# Patient Record
Sex: Female | Born: 1950
Health system: Southern US, Community
[De-identification: ages and names within clinical notes are randomized; demographics above are authoritative.]

## PROBLEM LIST (undated history)

## (undated) DIAGNOSIS — M1990 Unspecified osteoarthritis, unspecified site: Secondary | ICD-10-CM

## (undated) DIAGNOSIS — D649 Anemia, unspecified: Secondary | ICD-10-CM

## (undated) DIAGNOSIS — M549 Dorsalgia, unspecified: Secondary | ICD-10-CM

## (undated) DIAGNOSIS — J45909 Unspecified asthma, uncomplicated: Secondary | ICD-10-CM

## (undated) DIAGNOSIS — T7840XA Allergy, unspecified, initial encounter: Secondary | ICD-10-CM

## (undated) DIAGNOSIS — E039 Hypothyroidism, unspecified: Secondary | ICD-10-CM

## (undated) DIAGNOSIS — I10 Essential (primary) hypertension: Secondary | ICD-10-CM

## (undated) DIAGNOSIS — E119 Type 2 diabetes mellitus without complications: Secondary | ICD-10-CM

## (undated) HISTORY — PX: BACK SURGERY: SHX140

## (undated) HISTORY — DX: Unspecified osteoarthritis, unspecified site: M19.90

## (undated) HISTORY — DX: Allergy, unspecified, initial encounter: T78.40XA

## (undated) HISTORY — PX: CARPAL TUNNEL RELEASE: SHX101

## (undated) HISTORY — DX: Anemia, unspecified: D64.9

## (undated) HISTORY — DX: Unspecified asthma, uncomplicated: J45.909

## (undated) HISTORY — PX: TUBAL LIGATION: SHX77

---

## 1987-04-07 HISTORY — PX: ABDOMINAL HYSTERECTOMY: SHX81

## 2000-07-20 ENCOUNTER — Other Ambulatory Visit: Admission: RE | Admit: 2000-07-20 | Discharge: 2000-07-20 | Payer: Self-pay | Admitting: Obstetrics and Gynecology

## 2004-09-25 ENCOUNTER — Inpatient Hospital Stay (HOSPITAL_COMMUNITY): Admission: EM | Admit: 2004-09-25 | Discharge: 2004-09-26 | Payer: Self-pay | Admitting: Emergency Medicine

## 2006-07-30 ENCOUNTER — Encounter: Payer: Self-pay | Admitting: Emergency Medicine

## 2006-07-30 ENCOUNTER — Inpatient Hospital Stay (HOSPITAL_COMMUNITY): Admission: EM | Admit: 2006-07-30 | Discharge: 2006-08-05 | Payer: Self-pay | Admitting: Emergency Medicine

## 2006-08-02 ENCOUNTER — Ambulatory Visit: Payer: Self-pay | Admitting: Physical Medicine & Rehabilitation

## 2006-11-30 ENCOUNTER — Other Ambulatory Visit: Admission: RE | Admit: 2006-11-30 | Discharge: 2006-11-30 | Payer: Self-pay | Admitting: General Surgery

## 2006-12-25 ENCOUNTER — Encounter: Admission: RE | Admit: 2006-12-25 | Discharge: 2006-12-25 | Payer: Self-pay | Admitting: Orthopaedic Surgery

## 2008-08-11 ENCOUNTER — Encounter: Admission: RE | Admit: 2008-08-11 | Discharge: 2008-08-11 | Payer: Self-pay | Admitting: Orthopaedic Surgery

## 2008-10-26 ENCOUNTER — Encounter: Admission: RE | Admit: 2008-10-26 | Discharge: 2008-10-26 | Payer: Self-pay | Admitting: Obstetrics and Gynecology

## 2009-08-10 ENCOUNTER — Encounter: Admission: RE | Admit: 2009-08-10 | Discharge: 2009-08-10 | Payer: Self-pay | Admitting: Orthopaedic Surgery
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
## 2011-09-29 ENCOUNTER — Ambulatory Visit (INDEPENDENT_AMBULATORY_CARE_PROVIDER_SITE_OTHER): Payer: Medicare Other | Admitting: Family Medicine

## 2011-09-29 ENCOUNTER — Encounter: Payer: Self-pay | Admitting: Family Medicine

## 2011-09-29 VITALS — BP 140/96 | HR 72 | Temp 98.3°F | Ht 68.6 in | Wt 208.0 lb

## 2011-09-29 DIAGNOSIS — G8929 Other chronic pain: Secondary | ICD-10-CM

## 2011-09-29 DIAGNOSIS — R030 Elevated blood-pressure reading, without diagnosis of hypertension: Secondary | ICD-10-CM | POA: Insufficient documentation

## 2011-09-29 DIAGNOSIS — R600 Localized edema: Secondary | ICD-10-CM

## 2011-09-29 DIAGNOSIS — R609 Edema, unspecified: Secondary | ICD-10-CM

## 2011-09-29 DIAGNOSIS — IMO0001 Reserved for inherently not codable concepts without codable children: Secondary | ICD-10-CM

## 2011-09-29 DIAGNOSIS — M549 Dorsalgia, unspecified: Secondary | ICD-10-CM

## 2011-09-29 DIAGNOSIS — E669 Obesity, unspecified: Secondary | ICD-10-CM

## 2011-09-29 LAB — COMPREHENSIVE METABOLIC PANEL
ALT: <8 U/L (ref 0–35)
AST: 18 U/L (ref 0–37)
Albumin: 4.4 g/dL (ref 3.5–5.2)
Alkaline Phosphatase: 48 U/L (ref 39–117)
BUN: 13 mg/dL (ref 6–23)
CO2: 27 mEq/L (ref 19–32)
Calcium: 9.2 mg/dL (ref 8.4–10.5)
Chloride: 108 mEq/L (ref 96–112)
Creat: 0.78 mg/dL (ref 0.50–1.10)
Glucose, Bld: 101 mg/dL — ABNORMAL HIGH (ref 70–99)
Potassium: 4.4 mEq/L (ref 3.5–5.3)
Sodium: 141 mEq/L (ref 135–145)
Total Bilirubin: 0.3 mg/dL (ref 0.3–1.2)
Total Protein: 7.1 g/dL (ref 6.0–8.3)

## 2011-09-29 LAB — LIPID PANEL
Cholesterol: 180 mg/dL (ref 0–200)
HDL: 37 mg/dL — ABNORMAL LOW (ref >39)
LDL Cholesterol: 102 mg/dL — ABNORMAL HIGH (ref 0–99)
Total CHOL/HDL Ratio: 4.9 Ratio
Triglycerides: 204 mg/dL — ABNORMAL HIGH (ref <150)
VLDL: 41 mg/dL — ABNORMAL HIGH (ref 0–40)

## 2011-09-29 LAB — CBC
HCT: 34.9 % — ABNORMAL LOW (ref 36.0–46.0)
Hemoglobin: 11.4 g/dL — ABNORMAL LOW (ref 12.0–15.0)
MCH: 27.3 pg (ref 26.0–34.0)
MCHC: 32.7 g/dL (ref 30.0–36.0)
MCV: 83.5 fL (ref 78.0–100.0)
Platelets: 353 10*3/uL (ref 150–400)
RBC: 4.18 MIL/uL (ref 3.87–5.11)
RDW: 14.9 % (ref 11.5–15.5)
WBC: 7 10*3/uL (ref 4.0–10.5)

## 2011-09-29 LAB — HEPATITIS C ANTIBODY: HCV Ab: NEGATIVE

## 2011-09-29 MED ORDER — MEDICAL COMPRESSION STOCKINGS MISC: 2.0000 [IU] | Freq: Every morning | Status: DC

## 2011-09-29 NOTE — Patient Instructions (Addendum)
Nice to meet you. Will check labs to make sure no reason for leg swelling. Most likely just venous insufficiency, can be improved by elevation and compression stockings. Make an appointment in 2 weeks to check blood pressure and discuss results. Have your mammogram yearly.  Venous Stasis and Chronic Venous Insufficiency As people age, the veins located in their legs may weaken and stretch. When veins weaken and lose the ability to pump blood effectively, the condition is called chronic venous insufficiency (CVI) or venous stasis. Almost all veins return blood back to the heart. This happens by:  The force of the heart pumping fresh blood pushes blood back to the heart.   Blood flowing to the heart from the force of gravity.  In the deep veins of the legs, blood has to fight gravity and flow upstream back to the heart. Here, the leg muscles contract to pump blood back toward the heart. Vein walls are elastic, and many veins have small valves that only allow blood to flow in one direction. When leg muscles contract, they push inward against the elastic vein walls. This squeezes blood upward, opens the valves, and moves blood toward the heart. When leg muscles relax, the vein Sigley also relaxes and the valves inside the vein close to prevent blood from flowing backward. This method of pumping blood out of the legs is called the venous pump. CAUSES  The venous pump works best while walking and leg muscles are contracting. But when a person sits or stands, blood pressure in leg veins can build. Deep veins are usually able to withstand short periods of inactivity, but long periods of inactivity (and increased pressure) can stretch, weaken, and damage vein walls. High blood pressure can also stretch and damage vein walls. The veins may no longer be able to pump blood back to the heart. Venous hypertension (high blood pressure inside veins) that lasts over time is a primary cause of CVI. CVI can also be caused  by:   Deep vein thrombosis, a condition where a thrombus (blood clot) blocks blood flow in a vein.   Phlebitis, an inflammation of a superficial vein that causes a blood clot to form.  Other risk factors for CVI may include:   Heredity.   Obesity.   Pregnancy.   Sedentary lifestyle.   Smoking.   Jobs requiring long periods of standing or sitting in one place.   Age and gender:   Women in their 39's and 29's and men in their 95's are more prone to developing CVI.  SYMPTOMS  Symptoms of CVI may include:   Varicose veins.   Ulceration or skin breakdown.   Lipodermatosclerosis, a condition that affects the skin just above the ankle, usually on the inside surface. Over time the skin becomes brown, smooth, tight and often painful. Those with this condition have a high risk of developing skin ulcers.   Reddened or discolored skin on the leg.   Swelling.  DIAGNOSIS  Your caregiver can diagnose CVI after performing a careful medical history and physical examination. To confirm the diagnosis, the following tests may also be ordered:   Duplex ultrasound.   Plethysmography (tests blood flow).   Venograms (x-ray using a special dye).  TREATMENT The goals of treatment for CVI are to restore a person to an active life and to minimize pain or disability. Typically, CVI does not pose a serious threat to life or limb, and with proper treatment most people with this condition can continue to lead  active lives. In most cases, mild CVI can be treated on an outpatient basis with simple procedures. Treatment methods include:   Elastic compression socks.   Sclerotherapy, a procedure involving an injection of a material that "dissolves" the damaged veins. Other veins in the network of blood vessels take over the function of the damaged veins.   Vein stripping (an older procedure less commonly used).   Laser Ablation surgery.   Valve repair.  HOME CARE INSTRUCTIONS   Elastic  compression socks must be worn every day. They can help with symptoms and lower the chances of the problem getting worse, but they do not cure the problem.   Only take over-the-counter or prescription medicines for pain, discomfort, or fever as directed by your caregiver.   Your caregiver will review your other medications with you.  SEEK MEDICAL CARE IF:   You are confused about how to take your medications.   There is redness, swelling, or increasing pain in the affected area.   There is a red streak or line that extends up or down from the affected area.   There is a breakdown or loss of skin in the affected area, even if the breakdown is small.   You develop an unexplained oral temperature above 102 F (38.9 C).   There is an injury to the affected area.  SEEK IMMEDIATE MEDICAL CARE IF:   There is an injury and open wound to the affected area.   Pain is not adequately relieved with pain medication prescribed or becomes severe.   An oral temperature above 102 F (38.9 C) develops.   The foot/ankle below the affected area becomes suddenly numb or the area feels weak and hard to move.  MAKE SURE YOU:   Understand these instructions.   Will watch your condition.   Will get help right away if you are not doing well or get worse.  Document Released: 07/27/2006 Document Revised: 03/12/2011 Document Reviewed: 10/04/2006 [facility] Patient Information 2012 [place], [place].

## 2011-09-30 NOTE — Assessment & Plan Note (Signed)
Stable. Patient is not a surgical candidate related to her DDD. On chronic xanax, flexeril, vicodin prescribed by her previous PCP. Advised to schedule follow up visit when she needs refills of controlled medications.

## 2011-09-30 NOTE — Assessment & Plan Note (Signed)
No signs or symptoms suggestive of cardiac or hepatic failure. Most likely this is venous insufficieny with symmetric appearance, nontender. Will check basic labs, screen HCV. Advised to wear compression stockings and elevation of legs as tolerated. Follow up in 1-2 months.

## 2011-09-30 NOTE — Progress Notes (Signed)
  Subjective:    Patient ID: [name] [name], female    DOB: 12-Jul-1950, 61 y.o.   MRN: [id number]  HPI New patient to establish care.  1. Leg edema. Patient having stable bilateral swelling in ankles for past 6-8 months. Pain is minimal.She has tried elevation which helps sometimes.  Denies any trauma, leg pain, immobility, increase exertional dyspnea, abdominal swelling.   2. Chronic back pain.  Back surgeries in 1985 and 1987, severe DDD. No longer a surgical candidate. Followed by orthopedists Dr. Ophelia Charter and Dr. Ezzard Standing. On chronic xanax, flexeril and vicodin. Denies recent injury, weakness, numbness.  3. Obesity. Overall has gained weight in past several years, but is starting attempt to lose and has lost about 10 lbs so far.   Past Medical History  Diagnosis Date  . Allergy   . Arthritis   . Asthma   . Anemia    Past Surgical History  Procedure Date  . Tubal ligation   . Back surgery 1987, 1985    DDD  . Abdominal hysterectomy 1989    fibroid-no longer needs pap   Family History  Problem Relation Age of Onset  . Asthma Mother   . Cancer Mother     AML  . Heart disease Father   . Hypertension Father   . Kidney disease Father    History   Social History  . Marital Status: Divorced    Spouse Name: N/A    Number of Children: N/A  . Years of Education: N/A   Occupational History  . Not on file.   Social History Main Topics  . Smoking status: Never Smoker   . Smokeless tobacco: Not on file  . Alcohol Use: No  . Drug Use: No  . Sexually Active: Not Currently   Other Topics Concern  . Not on file   Social History Narrative   High school graduate, disabled from back pain. Divorced, has an adult son in [place].    Review of Systems See HPI otherwise negative.    Objective:   Physical Exam  Vitals reviewed. Constitutional: She is oriented to person, place, and time. She appears well-developed and well-nourished. No distress.  HENT:  Head: Normocephalic  and atraumatic.  Eyes: EOM are normal. Pupils are equal, round, and reactive to light.  Neck: Neck supple.  Cardiovascular: Normal rate, regular rhythm and normal heart sounds.   No murmur heard. Pulmonary/Chest: Effort normal and breath sounds normal. No respiratory distress. She has no wheezes. She has no rales.  Abdominal: Soft. Bowel sounds are normal. There is no tenderness.  Musculoskeletal: She exhibits edema. She exhibits no tenderness.       Bilateral ankles with generalized edema, nonpitting. Normal ROM, no redness or warmth, no calf tenderness. Normal gait.   Neurological: She is alert and oriented to person, place, and time. No cranial nerve deficit.  Skin: No rash noted. She is not diaphoretic.  Psychiatric: She has a normal mood and affect.       Assessment & Plan:

## 2011-09-30 NOTE — Assessment & Plan Note (Signed)
New problem for patient. Will check labs and have her return for recheck, may need to initiate diuretic if still elevated.

## 2011-10-01 ENCOUNTER — Encounter: Payer: Self-pay | Admitting: Family Medicine

## 2011-10-13 ENCOUNTER — Ambulatory Visit (INDEPENDENT_AMBULATORY_CARE_PROVIDER_SITE_OTHER): Payer: Medicare Other | Admitting: Family Medicine

## 2011-10-13 ENCOUNTER — Encounter: Payer: Self-pay | Admitting: Family Medicine

## 2011-10-13 VITALS — BP 138/88 | HR 72 | Temp 98.5°F | Ht 68.5 in | Wt 209.9 lb

## 2011-10-13 DIAGNOSIS — Z0000 Encounter for general adult medical examination without abnormal findings: Secondary | ICD-10-CM

## 2011-10-13 DIAGNOSIS — R030 Elevated blood-pressure reading, without diagnosis of hypertension: Secondary | ICD-10-CM

## 2011-10-13 DIAGNOSIS — R609 Edema, unspecified: Secondary | ICD-10-CM

## 2011-10-13 DIAGNOSIS — IMO0001 Reserved for inherently not codable concepts without codable children: Secondary | ICD-10-CM

## 2011-10-13 DIAGNOSIS — R600 Localized edema: Secondary | ICD-10-CM

## 2011-10-13 DIAGNOSIS — G8929 Other chronic pain: Secondary | ICD-10-CM

## 2011-10-13 DIAGNOSIS — M549 Dorsalgia, unspecified: Secondary | ICD-10-CM

## 2011-10-13 MED ORDER — CYCLOBENZAPRINE HCL 10 MG PO TABS: 10.0000 mg | ORAL_TABLET | Freq: Every day | ORAL | Status: DC | PRN

## 2011-10-13 MED ORDER — HYDROCODONE-ACETAMINOPHEN 7.5-500 MG PO TABS: 1.0000 | ORAL_TABLET | Freq: Every day | ORAL | Status: DC | PRN

## 2011-10-13 MED ORDER — HYDROCHLOROTHIAZIDE 25 MG PO TABS: 12.5000 mg | ORAL_TABLET | Freq: Every day | ORAL | Status: DC

## 2011-10-13 NOTE — Patient Instructions (Addendum)
Your lab didn't show a reason for ankle swelling. Most likely due to aging veins. You may try hydrochlorothiazide half tablet daily, will also help blood pressure. i don't mind refilling your pain medication if it helps you get around better. Please make an appointment in 2 months for follow up. Make sure to get your mammogram and we should check on when your colonoscopy is due.

## 2011-10-14 ENCOUNTER — Other Ambulatory Visit: Payer: Self-pay | Admitting: Obstetrics and Gynecology

## 2011-10-14 DIAGNOSIS — Z1231 Encounter for screening mammogram for malignant neoplasm of breast: Secondary | ICD-10-CM

## 2011-10-15 DIAGNOSIS — Z0000 Encounter for general adult medical examination without abnormal findings: Secondary | ICD-10-CM | POA: Insufficient documentation

## 2011-10-15 NOTE — Assessment & Plan Note (Signed)
Reminded patient she is due for mammogram and probably colonoscopy. Will attempt to get records.

## 2011-10-15 NOTE — Progress Notes (Signed)
  Subjective:    Patient ID: [name] [name], female    DOB: [birth year], 61 y.o.   MRN: [id number]  HPI  1. HTN f/u. Has stopped taking as much motrin.Has not checked BP at home.  Denies any visual change, chest pain, dyspnea, increase in leg swelling.   2. Ankle swelling. Unchanged. Has same amount of ankle swelling that varies slightly with elevation. No pain, redness, swelling in legs, numbness, tingling, exertional dyspnea, cough, fevers.   3. Back pain. Patient has been taking vicodin and xanax and flexeril intermittently to deal with her chronic back pain, increase her functionality at home. ON average takes less than one tablet daily. She is out of medication and requests new rx. Previously had 2 disc surgeries and told she is not a surgical candidate anymore. No longer follows with orthopedics regularly. denies worsening of pain, weakness, numbness, tingling, incontinence.   Review of Systems See HPI otherwise negative.  reports that she has never smoked. She does not have any smokeless tobacco history on file.     Objective:   Physical Exam  Vitals reviewed. Constitutional: She is oriented to person, place, and time. She appears well-developed and well-nourished. No distress.  HENT:  Head: Normocephalic and atraumatic.  Eyes: EOM are normal.  Neck: Normal range of motion. Neck supple.  Cardiovascular: Normal rate, regular rhythm and normal heart sounds.   No murmur heard. Pulmonary/Chest: Effort normal and breath sounds normal. No respiratory distress. She has no wheezes. She has no rales.  Abdominal: Soft. She exhibits no distension. There is no tenderness. There is no rebound and no guarding.  Musculoskeletal: She exhibits no tenderness.       Stable bilateral symmetric ankle edema. Nontender, nonpitting. Good pulses.   Neurological: She is alert and oriented to person, place, and time.  Skin: No rash noted.  Psychiatric: She has a normal mood and affect.      Assessment &  Plan:

## 2011-10-15 NOTE — Assessment & Plan Note (Signed)
Chronic DDD and resultant pain, unchanged. Since intermittent vicodin and flexeril are helpful to her daily living/function and activity level, i have agreed to continue these medications previously filled by her PCP prior to transfer. She has agreed to controlled substance agreement, and seems low risk for abuse potential. She is agreeable to DC the xanax as I believe may cause more of a problem in the long term. F/u in 2 months.

## 2011-10-15 NOTE — Assessment & Plan Note (Signed)
BP is at goal today, but borderline. Labs normal. Discussed with patient this wouldn't warrant treatment, but since she is concerned about cosmetic effect of ankle swelling we agree to try low dose HCTZ. F/u in 1- 2 months or sooner if problems.

## 2011-10-15 NOTE — Assessment & Plan Note (Signed)
No physical or laboratory indication of pathology. Likely this is chronic venous insufficiency. Low dose HCTZ as patient strongly desires trial for cosmetic effect (no discomfort associated). F/u in 1-2 months.

## 2011-10-29 ENCOUNTER — Ambulatory Visit: Payer: Medicare Other

## 2012-02-03 ENCOUNTER — Encounter: Payer: Self-pay | Admitting: Family Medicine

## 2012-02-03 ENCOUNTER — Ambulatory Visit (INDEPENDENT_AMBULATORY_CARE_PROVIDER_SITE_OTHER): Payer: Medicare Other | Admitting: Family Medicine

## 2012-02-03 VITALS — BP 137/76 | HR 72 | Temp 98.9°F | Ht 68.5 in | Wt 210.3 lb

## 2012-02-03 DIAGNOSIS — M549 Dorsalgia, unspecified: Secondary | ICD-10-CM

## 2012-02-03 DIAGNOSIS — G8929 Other chronic pain: Secondary | ICD-10-CM

## 2012-02-03 DIAGNOSIS — Z0000 Encounter for general adult medical examination without abnormal findings: Secondary | ICD-10-CM

## 2012-02-03 DIAGNOSIS — R300 Dysuria: Secondary | ICD-10-CM

## 2012-02-03 LAB — POCT UA - MICROSCOPIC ONLY

## 2012-02-03 LAB — POCT URINALYSIS DIPSTICK
Bilirubin, UA: NEGATIVE
Blood, UA: NEGATIVE
Glucose, UA: NEGATIVE
Ketones, UA: NEGATIVE
Nitrite, UA: NEGATIVE
Protein, UA: NEGATIVE
Spec Grav, UA: 1.025
Urobilinogen, UA: 0.2
pH, UA: 6

## 2012-02-03 MED ORDER — CYCLOBENZAPRINE HCL 10 MG PO TABS: 10.0000 mg | ORAL_TABLET | Freq: Every day | ORAL | Status: DC | PRN

## 2012-02-03 MED ORDER — HYDROCODONE-ACETAMINOPHEN 7.5-500 MG PO TABS: 1.0000 | ORAL_TABLET | Freq: Every day | ORAL | Status: DC | PRN

## 2012-02-03 NOTE — Progress Notes (Signed)
  Subjective:    Patient ID: [name] [name], female    DOB: 1951/04/02, 61 y.o.   MRN: [id number]  HPI  Patient presents to clinic to be screened for CKD.  Patient's father currently diagnosed with ESRD and on HD.  Her uncle and brother also have CKD.  Patient says she was told by her father's physician that will need to be tested routinely.  Patient endorses lower extremity/ankle edema bilaterally.  Otherwise, she has no other complaints.  Denies any change in urinary frequency or dysuria.  I reviewed recent CMET from 09/2011.  Patient unaware that kidney function tests were included in this test.  She is agreeable to deferring blood work until next June.  Patient asking for refills Flexeril and Lortab for chronic low back pain.  Discussed with patient that only PCP can presribe Lortab.  Patient says she lives in [place], so would appreciate it if she could pick up refill today.  Last refill was in July 2013 for 30 tab and one refill.  She has 3 tablets left.  I discussed with PCP who agreed with Lortab refill, but patient aware that she can only get this from PCP from now on.   Review of Systems  Per HPI    Objective:   Physical Exam  Constitutional: No distress.  Musculoskeletal: Normal range of motion.       3+ Non-pitting edema ankles bilaterally, over lateral malleolus  Skin: Skin is warm and dry.          Assessment & Plan:

## 2012-02-03 NOTE — Assessment & Plan Note (Signed)
-   Will refill Flexeril and Lortab since PCP will be out this week (patient aware that she will get Rx for narcotics from PCP in the future)

## 2012-02-03 NOTE — Assessment & Plan Note (Addendum)
Patient here for lab work to check for chronic kidney disease.  She had recent lab work done that showed normal kidney function.  Recommended patient repeat these labs annually. - UA performed to check for casts or protein - Follow up with PCP in 3 months

## 2012-02-03 NOTE — Patient Instructions (Addendum)
It was nice to meet you today, [name] [name]. Your kidney function tests from June 2013 were normal.  We will check your urine today too. Flexeril sent to your pharmacy. Because Dr. Cristal Ford will be out of the office this week, I will refill your Lortab this one time. For future reference, if you need refills on Lortab, you will need to schedule appointment with your Primary Doctor. Schedule follow up appointment with your Primary Doctor in 3 months or sooner as needed.

## 2012-02-19 ENCOUNTER — Other Ambulatory Visit: Payer: Self-pay | Admitting: Family Medicine

## 2012-06-20 ENCOUNTER — Telehealth: Payer: Self-pay | Admitting: Family Medicine

## 2012-06-20 NOTE — Telephone Encounter (Signed)
[facility] is calling for clarification on the dose of Lortab.  She did say the patient is standing there waiting.

## 2012-06-20 NOTE — Telephone Encounter (Signed)
FYI---Lortab is no longer available in 7.5/500 mg.  Only available in 7.5/325 mg.  Pharmacy will change strength to 7.5/325 mg.  Gaylene Brooks, RN

## 2012-08-03 ENCOUNTER — Other Ambulatory Visit: Payer: Self-pay

## 2012-08-03 DIAGNOSIS — Z1231 Encounter for screening mammogram for malignant neoplasm of breast: Secondary | ICD-10-CM

## 2012-08-16 ENCOUNTER — Ambulatory Visit
Admission: RE | Admit: 2012-08-16 | Discharge: 2012-08-16 | Disposition: A | Discharge status: Home / Self Care | Payer: Medicare Other | Source: Ambulatory Visit

## 2012-08-16 DIAGNOSIS — Z1231 Encounter for screening mammogram for malignant neoplasm of breast: Secondary | ICD-10-CM

## 2012-10-02 ENCOUNTER — Emergency Department (HOSPITAL_COMMUNITY)
Admission: EM | Admit: 2012-10-02 | Discharge: 2012-10-02 | Disposition: A | Discharge status: Home / Self Care | Payer: Medicare Other | Attending: Emergency Medicine | Admitting: Emergency Medicine

## 2012-10-02 ENCOUNTER — Encounter (HOSPITAL_COMMUNITY): Payer: Self-pay | Admitting: *Deleted

## 2012-10-02 ENCOUNTER — Emergency Department (HOSPITAL_COMMUNITY): Payer: Medicare Other

## 2012-10-02 DIAGNOSIS — W010XXA Fall on same level from slipping, tripping and stumbling without subsequent striking against object, initial encounter: Secondary | ICD-10-CM | POA: Insufficient documentation

## 2012-10-02 DIAGNOSIS — S93409A Sprain of unspecified ligament of unspecified ankle, initial encounter: Secondary | ICD-10-CM | POA: Insufficient documentation

## 2012-10-02 DIAGNOSIS — J45909 Unspecified asthma, uncomplicated: Secondary | ICD-10-CM | POA: Insufficient documentation

## 2012-10-02 DIAGNOSIS — X500XXA Overexertion from strenuous movement or load, initial encounter: Secondary | ICD-10-CM | POA: Insufficient documentation

## 2012-10-02 DIAGNOSIS — Y9301 Activity, walking, marching and hiking: Secondary | ICD-10-CM | POA: Insufficient documentation

## 2012-10-02 DIAGNOSIS — Y9229 Other specified public building as the place of occurrence of the external cause: Secondary | ICD-10-CM | POA: Insufficient documentation

## 2012-10-02 DIAGNOSIS — M129 Arthropathy, unspecified: Secondary | ICD-10-CM | POA: Insufficient documentation

## 2012-10-02 DIAGNOSIS — Z8739 Personal history of other diseases of the musculoskeletal system and connective tissue: Secondary | ICD-10-CM | POA: Insufficient documentation

## 2012-10-02 DIAGNOSIS — S93401A Sprain of unspecified ligament of right ankle, initial encounter: Secondary | ICD-10-CM

## 2012-10-02 DIAGNOSIS — Z862 Personal history of diseases of the blood and blood-forming organs and certain disorders involving the immune mechanism: Secondary | ICD-10-CM | POA: Insufficient documentation

## 2012-10-02 HISTORY — DX: Dorsalgia, unspecified: M54.9

## 2012-10-02 MED ORDER — IBUPROFEN 800 MG PO TABS
800.0000 mg | ORAL_TABLET | Freq: Once | ORAL | Status: AC
  Administered 2012-10-02: 800 mg via ORAL
  Filled 2012-10-02: qty 1

## 2012-10-02 MED ORDER — OXYCODONE-ACETAMINOPHEN 5-325 MG PO TABS: 1.0000 | ORAL_TABLET | ORAL | Status: DC | PRN

## 2012-10-02 MED ORDER — OXYCODONE-ACETAMINOPHEN 5-325 MG PO TABS
1.0000 | ORAL_TABLET | Freq: Once | ORAL | Status: AC
  Administered 2012-10-02: 1 via ORAL
  Filled 2012-10-02: qty 1

## 2012-10-02 NOTE — ED Notes (Addendum)
Pt fell while walking out of church, injuring both ankles, pt c/o pain and swelling to bilateral ankles. Cms intact pt thinks that she may have tripped causing her to fall

## 2012-10-02 NOTE — ED Provider Notes (Signed)
History  This chart was scribed for Joya Gaskins, MD by Manuela Schwartz, ED scribe. This patient was seen in room APA14/APA14 and the patient's care was started at 1654.  CSN: [id number] Arrival date & time 10/02/12  1628  First MD Initiated Contact with Patient 10/02/12 1654     Chief Complaint  Patient presents with  . Ankle Pain   Patient is a 62 y.o. female presenting with ankle pain. The history is provided by the patient. No language interpreter was used.  Ankle Pain Location:  Ankle Ankle location:  L ankle and R ankle Pain details:    Quality:  Aching Associated symptoms: swelling    HPI Comments: [name] [name] is a 62 y.o. female who presents to the Emergency Department complaining of acute onset bilateral ankle pain after she rolled them 2 times while walking out of church today and 3rd time her ankles rolled she fell to the ground, denies hitting her head, LOC, neck/back pain, dizziness, weakness. She states associated pain and swelling to her ankles and denies any other pain/injuries above her ankles. She denies previous injuries or surgeries to her feet/ankles. She denies nausea, emesis, SOB, CP and does not take blood thinner medicines.       Past Medical History  Diagnosis Date  . Allergy   . Arthritis   . Asthma   . Anemia   . Back pain    Past Surgical History  Procedure Laterality Date  . Tubal ligation    . Back surgery  1987, 1985    DDD  . Abdominal hysterectomy  1989    fibroid-no longer needs pap   Family History  Problem Relation Age of Onset  . Asthma Mother   . Cancer Mother     AML  . Heart disease Father   . Hypertension Father   . Kidney disease Father    History  Substance Use Topics  . Smoking status: Never Smoker   . Smokeless tobacco: Not on file  . Alcohol Use: No   OB History   Grav Para Term Preterm Abortions TAB SAB Ect Mult Living                 Review of Systems  Constitutional: Negative for chills.   Respiratory: Negative for shortness of breath.   Gastrointestinal: Negative for nausea and vomiting.  Neurological: Negative for weakness.     Allergies  Flagyl  Home Medications   Current Outpatient Rx  Name  Route  Sig  Dispense  Refill  . cyclobenzaprine (FLEXERIL) 10 MG tablet   Oral   Take 10 mg by mouth daily as needed for muscle spasms ((Back pain)).         Marland Kitchen HYDROcodone-acetaminophen (NORCO) 7.5-325 MG per tablet   Oral   Take 1 tablet by mouth every 6 (six) hours as needed for pain.          Triage Vitals: BP 163/86  Pulse 85  Temp(Src) 99 F (37.2 C)  Resp 20  SpO2 99% Physical Exam CONSTITUTIONAL: Well developed/well nourished HEAD AND FACE: Normocephalic/atraumatic EYES: EOMI/PERRL ENMT: Mucous membranes moist NECK: supple no meningeal signs SPINE:entire spine nontender CV: S1/S2 noted, no murmurs/rubs/gallops noted LUNGS: Lungs are clear to auscultation bilaterally, no apparent distress ABDOMEN: soft, nontender, no rebound or guarding GU:no cva tenderness NEURO: Pt is awake/alert, moves all extremitiesx4 EXTREMITIES: pulses normal, full ROM, bilateral ankle swelling/tenderness, no distal foot tenderness, All other extremities/joints palpated/ranged and nontender  No prox fib  tenderness Bilateral achilles intact SKIN: warm, color normal PSYCH: no abnormalities of mood noted  ED Course  Procedures  DIAGNOSTIC STUDIES: Oxygen Saturation is 99% on room air, normal by my interpretation.     Suspect bilateral ankle sprain due to mechanical fall.  Pt reports right ankle feels worse Ordered air splint, rest, use crutches and ortho f/u if no improvement after 7-10 days MDM  Nursing notes including past medical history and social history reviewed and considered in documentation xrays reviewed and considered   I personally performed the services described in this documentation, which was scribed in my presence. The recorded information has been  reviewed and is accurate.    Joya Gaskins, MD 10/03/12 [phone]

## 2013-05-31 ENCOUNTER — Other Ambulatory Visit: Payer: Self-pay | Admitting: Internal Medicine

## 2013-05-31 DIAGNOSIS — E2839 Other primary ovarian failure: Secondary | ICD-10-CM

## 2013-06-16 ENCOUNTER — Ambulatory Visit
Admission: RE | Admit: 2013-06-16 | Discharge: 2013-06-16 | Disposition: A | Discharge status: Home / Self Care | Payer: Medicare Other | Source: Ambulatory Visit | Attending: Internal Medicine | Admitting: Internal Medicine

## 2013-06-16 DIAGNOSIS — E2839 Other primary ovarian failure: Secondary | ICD-10-CM

## 2013-09-25 ENCOUNTER — Other Ambulatory Visit: Payer: Self-pay

## 2013-09-25 DIAGNOSIS — Z1231 Encounter for screening mammogram for malignant neoplasm of breast: Secondary | ICD-10-CM

## 2013-09-28 ENCOUNTER — Ambulatory Visit
Admission: RE | Admit: 2013-09-28 | Discharge: 2013-09-28 | Disposition: A | Discharge status: Home / Self Care | Payer: Medicare Other | Source: Ambulatory Visit

## 2013-09-28 ENCOUNTER — Encounter (INDEPENDENT_AMBULATORY_CARE_PROVIDER_SITE_OTHER): Payer: Self-pay

## 2013-09-28 DIAGNOSIS — Z1231 Encounter for screening mammogram for malignant neoplasm of breast: Secondary | ICD-10-CM

## 2014-05-24 DIAGNOSIS — M1712 Unilateral primary osteoarthritis, left knee: Secondary | ICD-10-CM | POA: Insufficient documentation

## 2014-06-08 ENCOUNTER — Encounter (HOSPITAL_BASED_OUTPATIENT_CLINIC_OR_DEPARTMENT_OTHER): Payer: Medicare Other

## 2015-03-14 DIAGNOSIS — J4520 Mild intermittent asthma, uncomplicated: Secondary | ICD-10-CM | POA: Diagnosis not present

## 2015-03-14 DIAGNOSIS — Z719 Counseling, unspecified: Secondary | ICD-10-CM | POA: Diagnosis not present

## 2015-03-14 DIAGNOSIS — I10 Essential (primary) hypertension: Secondary | ICD-10-CM | POA: Diagnosis not present

## 2015-03-14 DIAGNOSIS — Z13220 Encounter for screening for lipoid disorders: Secondary | ICD-10-CM | POA: Diagnosis not present

## 2015-03-14 DIAGNOSIS — M5126 Other intervertebral disc displacement, lumbar region: Secondary | ICD-10-CM | POA: Diagnosis not present

## 2015-03-14 DIAGNOSIS — R7301 Impaired fasting glucose: Secondary | ICD-10-CM | POA: Diagnosis not present

## 2015-05-08 DIAGNOSIS — Z124 Encounter for screening for malignant neoplasm of cervix: Secondary | ICD-10-CM | POA: Diagnosis not present

## 2015-05-08 DIAGNOSIS — Z01419 Encounter for gynecological examination (general) (routine) without abnormal findings: Secondary | ICD-10-CM | POA: Diagnosis not present

## 2015-05-08 DIAGNOSIS — R350 Frequency of micturition: Secondary | ICD-10-CM | POA: Diagnosis not present

## 2015-05-20 ENCOUNTER — Other Ambulatory Visit: Payer: Self-pay

## 2015-05-20 DIAGNOSIS — Z1231 Encounter for screening mammogram for malignant neoplasm of breast: Secondary | ICD-10-CM

## 2015-05-23 ENCOUNTER — Ambulatory Visit: Payer: Medicare Other

## 2015-06-13 ENCOUNTER — Ambulatory Visit
Admission: RE | Admit: 2015-06-13 | Discharge: 2015-06-13 | Disposition: A | Discharge status: Home / Self Care | Payer: Commercial Managed Care - HMO | Source: Ambulatory Visit

## 2015-06-13 DIAGNOSIS — Z1231 Encounter for screening mammogram for malignant neoplasm of breast: Secondary | ICD-10-CM

## 2016-01-23 ENCOUNTER — Encounter: Payer: Self-pay | Admitting: Family Medicine

## 2016-01-23 ENCOUNTER — Ambulatory Visit (INDEPENDENT_AMBULATORY_CARE_PROVIDER_SITE_OTHER): Payer: Medicare HMO | Admitting: Family Medicine

## 2016-01-23 VITALS — BP 154/83 | HR 70 | Temp 98.3°F | Ht 68.5 in | Wt 217.0 lb

## 2016-01-23 DIAGNOSIS — G5603 Carpal tunnel syndrome, bilateral upper limbs: Secondary | ICD-10-CM

## 2016-01-23 DIAGNOSIS — R030 Elevated blood-pressure reading, without diagnosis of hypertension: Secondary | ICD-10-CM | POA: Diagnosis not present

## 2016-01-23 MED ORDER — HYDROCHLOROTHIAZIDE 12.5 MG PO TABS: 12.5000 mg | ORAL_TABLET | Freq: Every day | ORAL | 1 refills | Status: DC

## 2016-01-23 NOTE — Progress Notes (Signed)
BP (!) 161/83   Pulse 75   Temp 98.3 F (36.8 C) (Oral)   Ht 5' 8.5" (1.74 m)   Wt 217 lb (98.4 kg)   BMI 32.51 kg/m    Subjective:    Patient ID: [name] [name], female    DOB: 04/07/1950, 65 y.o.   MRN: [id number]  HPI: [name] NineVirginia A Huffstetler is a 65 y.o. female presenting on 01/23/2016 for Numbness (hand numbness when waking up then some during day for last 3 wks. dad died last june of poor circulation) and Hypertension (dr at one time told her she was borderline hypertensive)   HPI Elevated blood pressure Patient comes in today because she has been having elevated blood pressures. She has been borderline previously but was told by her dentist that it was up more recently. She has never been on medications for it before. On 2 different checks her blood pressures running in the 160s over 80s today here in the office. She has checked it on multiple occasions at home and she typically runs in the 140s over 80s. Patient denies headaches, blurred vision, chest pains, shortness of breath, or weakness. She has never tried any medication for it before.  Numbness and pain in her hands.  Patient has complaints of numbness and tingling and burning pain in her hands that has been increasing over the past 3 weeks. She says it especially worse when she wakes up in the morning and she feels so numb like she can't move it but it does improve as the day goes. She says that it is worse in her left hand than her right hand but it is in bolus. She says the worst of the tingling and burning and numbness is in her thumb and index and middle fingers. She also feels some in her palm as well on that side. Currently today right now the left hand is the only one that is bothering her. She did try one 200 mg ibuprofen which seemed to help some but not completely a couple of days ago.  Relevant past medical, surgical, family and social history reviewed and updated as indicated. Interim medical history since our last visit  reviewed. Allergies and medications reviewed and updated.  Review of Systems  Constitutional: Negative for chills and fever.  HENT: Negative for congestion, ear discharge and ear pain.   Eyes: Negative for redness and visual disturbance.  Respiratory: Negative for chest tightness and shortness of breath.   Cardiovascular: Negative for chest pain and leg swelling.  Genitourinary: Negative for difficulty urinating and dysuria.  Musculoskeletal: Negative for back pain and gait problem.  Skin: Negative for color change and rash.  Neurological: Positive for numbness. Negative for weakness, light-headedness and headaches.  Psychiatric/Behavioral: Negative for agitation and behavioral problems.  All other systems reviewed and are negative.   Per HPI unless specifically indicated above  Social History   Social History  . Marital status: Divorced    Spouse name: N/A  . Number of children: N/A  . Years of education: N/A   Occupational History  . Not on file.   Social History Main Topics  . Smoking status: Never Smoker  . Smokeless tobacco: Never Used  . Alcohol use No  . Drug use: No  . Sexual activity: Not Currently   Other Topics Concern  . Not on file   Social History Narrative   High school graduate, disabled from back pain. Divorced, has an adult son in [place].    Past  Surgical History:  Procedure Laterality Date  . ABDOMINAL HYSTERECTOMY  1989   fibroid-no longer needs pap  . BACK SURGERY  1987, 1985   DDD  . TUBAL LIGATION      Family History  Problem Relation Age of Onset  . Asthma Mother   . Cancer Mother     AML  . Leukemia Mother   . Heart disease Father   . Hypertension Father   . Kidney disease Father       Medication List       Accurate as of 01/23/16  9:59 AM. Always use your most recent med list.          hydrochlorothiazide 12.5 MG tablet Commonly known as:  HYDRODIURIL Take 1 tablet (12.5 mg total) by mouth daily.   ibuprofen  600 MG tablet Commonly known as:  ADVIL,MOTRIN Take 1 tablet by mouth as needed.          Objective:    BP (!) 161/83   Pulse 75   Temp 98.3 F (36.8 C) (Oral)   Ht 5' 8.5" (1.74 m)   Wt 217 lb (98.4 kg)   BMI 32.51 kg/m   Wt Readings from Last 3 Encounters:  01/23/16 217 lb (98.4 kg)  02/03/12 210 lb 4.8 oz (95.4 kg)  10/13/11 209 lb 14.4 oz (95.2 kg)    Physical Exam  Constitutional: She is oriented to person, place, and time. She appears well-developed and well-nourished. No distress.  Eyes: Conjunctivae are normal.  Cardiovascular: Normal rate, regular rhythm, normal heart sounds and intact distal pulses.   No murmur heard. Pulmonary/Chest: Effort normal and breath sounds normal. No respiratory distress. She has no wheezes.  Musculoskeletal: Normal range of motion. She exhibits no edema or tenderness.  Neurological: She is alert and oriented to person, place, and time. A sensory deficit (Tingling and perception of sense of loss of sensation in index and middle finger and thumb on left hand) is present. Coordination normal.  Positive Phalen's and Tinel's on left hand, negative on right hand for both  Skin: Skin is warm and dry. No rash noted. She is not diaphoretic.  Psychiatric: She has a normal mood and affect. Her behavior is normal.  Nursing note and vitals reviewed.     Assessment & Plan:   Problem List Items Addressed This Visit      Other   Elevated blood pressure reading   Relevant Medications   hydrochlorothiazide (HYDRODIURIL) 12.5 MG tablet    Other Visit Diagnoses    Bilateral carpal tunnel syndrome    -  Primary   Use 600 mg ibuprofen twice a day for 7 days, if not improved return for injection       Follow up plan: Return in about 4 weeks (around 02/20/2016), or if symptoms worsen or fail to improve, for Blood pressure recheck and fasting labs.  Arville Care, MD [facility] 01/23/2016, 9:59 AM

## 2016-01-23 NOTE — Patient Instructions (Signed)

## 2016-02-20 ENCOUNTER — Ambulatory Visit (INDEPENDENT_AMBULATORY_CARE_PROVIDER_SITE_OTHER): Payer: Medicare HMO | Admitting: Family Medicine

## 2016-02-20 ENCOUNTER — Encounter: Payer: Self-pay | Admitting: Family Medicine

## 2016-02-20 VITALS — BP 154/81 | HR 69 | Temp 97.7°F | Ht 68.5 in | Wt 219.0 lb

## 2016-02-20 DIAGNOSIS — E669 Obesity, unspecified: Secondary | ICD-10-CM

## 2016-02-20 DIAGNOSIS — I10 Essential (primary) hypertension: Secondary | ICD-10-CM | POA: Diagnosis not present

## 2016-02-20 DIAGNOSIS — R7309 Other abnormal glucose: Secondary | ICD-10-CM | POA: Diagnosis not present

## 2016-02-20 DIAGNOSIS — G5603 Carpal tunnel syndrome, bilateral upper limbs: Secondary | ICD-10-CM

## 2016-02-20 MED ORDER — METHYLPREDNISOLONE ACETATE 80 MG/ML IJ SUSP
80.0000 mg | Freq: Once | INTRAMUSCULAR | Status: AC
Start: 2016-02-20 — End: 2016-02-20
  Administered 2016-02-20: 80 mg via INTRAMUSCULAR

## 2016-02-20 MED ORDER — HYDROCHLOROTHIAZIDE 25 MG PO TABS: 25.0000 mg | ORAL_TABLET | Freq: Every day | ORAL | 1 refills | Status: DC

## 2016-02-20 NOTE — Progress Notes (Signed)
BP (!) 154/81   Pulse 69   Temp 97.7 F (36.5 C) (Oral)   Ht 5' 8.5" (1.74 m)   Wt 219 lb (99.3 kg)   BMI 32.81 kg/m    Subjective:    Patient ID: [name] [name], female    DOB: Oct 03, 1950, 65 y.o.   MRN: [id number]  HPI: [name] [name] is a 65 y.o. female presenting on 02/20/2016 for Hypertension (4 week followup) and Carpal Tunnel (followup)   HPI Hypertension recheck Patient is coming in today for hypertension recheck. Her blood pressure today is 154/81. She admits that she did not take her hydrochlorothiazide today. She also admits that she has not been very good about taking it on a day-to-day basis and has missed quite a few days over the past 4 weeks. She says when she does take it she has checked it at home and it is still running in the 140s over 80s. She denies any lightheadedness or dizziness. She is coming in fasting so we can do labs today. We discussed her weight and how that can play a factor in her hypertension as well as her salt intake. Also discussed diet and exercise lifestyle changes and how she could possibly come off of medications if she makes those changes over the next few months.  Carpal tunnel left more than right Patient tried a couple weeks of high-dose ibuprofen along with using a wrist splint at night and she still feels like she is having a lot of issues especially when she wakes up in the morning. She says her left hand she still feels the numbness. She denies any grip strength loss or dropping things. She is ready to get an injection in that left hand. The right hand still has some but not much issues.  Relevant past medical, surgical, family and social history reviewed and updated as indicated. Interim medical history since our last visit reviewed. Allergies and medications reviewed and updated.  Review of Systems  Constitutional: Negative for chills and fever.  HENT: Negative for congestion, ear discharge and ear pain.   Eyes: Negative for redness  and visual disturbance.  Respiratory: Negative for chest tightness and shortness of breath.   Cardiovascular: Negative for chest pain and leg swelling.  Genitourinary: Negative for difficulty urinating and dysuria.  Musculoskeletal: Positive for arthralgias. Negative for back pain and gait problem.  Skin: Negative for rash.  Neurological: Positive for numbness. Negative for dizziness, light-headedness and headaches.  Psychiatric/Behavioral: Negative for agitation and behavioral problems.  All other systems reviewed and are negative.   Per HPI unless specifically indicated above     Medication List       Accurate as of 02/20/16  8:31 AM. Always use your most recent med list.          hydrochlorothiazide 25 MG tablet Commonly known as:  HYDRODIURIL Take 1 tablet (25 mg total) by mouth daily.   ibuprofen 600 MG tablet Commonly known as:  ADVIL,MOTRIN Take 1 tablet by mouth as needed.          Objective:    BP (!) 154/81   Pulse 69   Temp 97.7 F (36.5 C) (Oral)   Ht 5' 8.5" (1.74 m)   Wt 219 lb (99.3 kg)   BMI 32.81 kg/m   Wt Readings from Last 3 Encounters:  02/20/16 219 lb (99.3 kg)  01/23/16 217 lb (98.4 kg)  02/03/12 210 lb 4.8 oz (95.4 kg)    Physical Exam  Constitutional:  She is oriented to person, place, and time. She appears well-developed and well-nourished. No distress.  Eyes: Conjunctivae are normal.  Cardiovascular: Normal rate, regular rhythm, normal heart sounds and intact distal pulses.   No murmur heard. Pulmonary/Chest: Effort normal and breath sounds normal. No respiratory distress. She has no wheezes.  Musculoskeletal: Normal range of motion. She exhibits no edema.       Left wrist: She exhibits tenderness (Positive Tinel sign, no loss of grip strength). She exhibits normal range of motion, no swelling and no deformity.  Neurological: She is alert and oriented to person, place, and time. Coordination normal.  Skin: Skin is warm and dry. No  rash noted. She is not diaphoretic.  Psychiatric: She has a normal mood and affect. Her behavior is normal.  Nursing note and vitals reviewed.   Left carpal tunnel injection: Risk factors of bleeding and infection discussed with patient and patient is agreeable towards injection. Patient prepped with Betadine. Anterior approach towards injection used. Injected 40 mg of Depo-Medrol and 1/2 mL of 2% lidocaine. Patient tolerated procedure well and no side effects from noted. Minimal to no bleeding. Simple bandage applied after.     Assessment & Plan:   Problem List Items Addressed This Visit      Cardiovascular and Mediastinum   Essential hypertension, benign - Primary   Relevant Medications   hydrochlorothiazide (HYDRODIURIL) 25 MG tablet   Other Relevant Orders   CMP14+EGFR   CBC with Differential/Platelet    Other Visit Diagnoses    Bilateral carpal tunnel syndrome       Relevant Medications   methylPREDNISolone acetate (DEPO-MEDROL) injection 80 mg (Start on 02/20/2016  8:45 AM)   Obesity (BMI 30-39.9)       Relevant Orders   Lipid panel       Follow up plan: Return in about 4 weeks (around 03/19/2016), or if symptoms worsen or fail to improve, for Recheck blood pressure.  Counseling provided for all of the vaccine components Orders Placed This Encounter  Procedures  . CMP14+EGFR  . CBC with Differential/Platelet  . Lipid panel    Caryl Pina, MD [facility] 02/20/2016, 8:31 AM

## 2016-02-21 LAB — LIPID PANEL
Chol/HDL Ratio: 4.8 ratio units — ABNORMAL HIGH (ref 0.0–4.4)
Cholesterol, Total: 212 mg/dL — ABNORMAL HIGH (ref 100–199)
HDL: 44 mg/dL (ref >39)
LDL Calculated: 136 mg/dL — ABNORMAL HIGH (ref 0–99)
Triglycerides: 161 mg/dL — ABNORMAL HIGH (ref 0–149)
VLDL Cholesterol Cal: 32 mg/dL (ref 5–40)

## 2016-02-21 LAB — CMP14+EGFR
ALT: 6 IU/L (ref 0–32)
AST: 16 IU/L (ref 0–40)
Albumin/Globulin Ratio: 1.4 (ref 1.2–2.2)
Albumin: 4.1 g/dL (ref 3.6–4.8)
Alkaline Phosphatase: 44 IU/L (ref 39–117)
BUN/Creatinine Ratio: 14 (ref 12–28)
BUN: 12 mg/dL (ref 8–27)
Bilirubin Total: 0.2 mg/dL (ref 0.0–1.2)
CO2: 24 mmol/L (ref 18–29)
Calcium: 9 mg/dL (ref 8.7–10.3)
Chloride: 105 mmol/L (ref 96–106)
Creatinine, Ser: 0.84 mg/dL (ref 0.57–1.00)
GFR calc Af Amer: 85 mL/min/{1.73_m2} (ref >59)
GFR calc non Af Amer: 74 mL/min/{1.73_m2} (ref >59)
Globulin, Total: 2.9 g/dL (ref 1.5–4.5)
Glucose: 107 mg/dL — ABNORMAL HIGH (ref 65–99)
Potassium: 4.1 mmol/L (ref 3.5–5.2)
Sodium: 145 mmol/L — ABNORMAL HIGH (ref 134–144)
Total Protein: 7 g/dL (ref 6.0–8.5)

## 2016-02-21 LAB — CBC WITH DIFFERENTIAL/PLATELET
Basophils Absolute: 0 10*3/uL (ref 0.0–0.2)
Basos: 0 %
EOS (ABSOLUTE): 0.7 10*3/uL — ABNORMAL HIGH (ref 0.0–0.4)
Eos: 8 %
Hematocrit: 33.2 % — ABNORMAL LOW (ref 34.0–46.6)
Hemoglobin: 10.5 g/dL — ABNORMAL LOW (ref 11.1–15.9)
Immature Grans (Abs): 0 10*3/uL (ref 0.0–0.1)
Immature Granulocytes: 0 %
Lymphocytes Absolute: 4.2 10*3/uL — ABNORMAL HIGH (ref 0.7–3.1)
Lymphs: 44 %
MCH: 27.2 pg (ref 26.6–33.0)
MCHC: 31.6 g/dL (ref 31.5–35.7)
MCV: 86 fL (ref 79–97)
Monocytes Absolute: 0.9 10*3/uL (ref 0.1–0.9)
Monocytes: 9 %
Neutrophils Absolute: 3.7 10*3/uL (ref 1.4–7.0)
Neutrophils: 39 %
Platelets: 369 10*3/uL (ref 150–379)
RBC: 3.86 x10E6/uL (ref 3.77–5.28)
RDW: 14.9 % (ref 12.3–15.4)
WBC: 9.6 10*3/uL (ref 3.4–10.8)

## 2016-02-26 LAB — SPECIMEN STATUS REPORT

## 2016-02-26 LAB — HGB A1C W/O EAG: Hgb A1c MFr Bld: 6.4 % — ABNORMAL HIGH (ref 4.8–5.6)

## 2016-03-04 ENCOUNTER — Telehealth: Payer: Self-pay | Admitting: Family Medicine

## 2016-03-04 MED ORDER — AMLODIPINE BESYLATE 5 MG PO TABS: 5.0000 mg | ORAL_TABLET | Freq: Every day | ORAL | 3 refills | Status: DC

## 2016-03-04 NOTE — Telephone Encounter (Signed)
Patient states since being on hydrochlorothiazide 30mg  she has been having cramps all over her body that is keeping her from sleeping at night.  It was started 02/20/16. Patient states that she had been trying to drink more water and eat more bananas but it is not helping. Please advise and send back to the pools.

## 2016-03-04 NOTE — Telephone Encounter (Signed)
Patient aware and verbalizes understanding. 

## 2016-03-06 ENCOUNTER — Telehealth: Payer: Self-pay | Admitting: Family Medicine

## 2016-03-06 NOTE — Telephone Encounter (Signed)
Spoke with pt regarding leg cramps BP med changed on 03/04/2016 Pt can try Ibuprofen for cramps also foods high in K+ Will call for appt if sxs persist or worsen

## 2016-03-11 ENCOUNTER — Encounter (INDEPENDENT_AMBULATORY_CARE_PROVIDER_SITE_OTHER): Payer: Self-pay | Admitting: *Deleted

## 2016-03-11 ENCOUNTER — Other Ambulatory Visit: Payer: Self-pay | Admitting: Family Medicine

## 2016-03-11 ENCOUNTER — Encounter: Payer: Self-pay | Admitting: Family Medicine

## 2016-03-11 ENCOUNTER — Ambulatory Visit (INDEPENDENT_AMBULATORY_CARE_PROVIDER_SITE_OTHER): Payer: Commercial Managed Care - HMO

## 2016-03-11 ENCOUNTER — Ambulatory Visit (INDEPENDENT_AMBULATORY_CARE_PROVIDER_SITE_OTHER): Payer: Medicare HMO | Admitting: Family Medicine

## 2016-03-11 VITALS — BP 132/88 | HR 77 | Temp 97.1°F | Ht 68.5 in | Wt 214.2 lb

## 2016-03-11 DIAGNOSIS — Z780 Asymptomatic menopausal state: Secondary | ICD-10-CM | POA: Diagnosis not present

## 2016-03-11 DIAGNOSIS — R252 Cramp and spasm: Secondary | ICD-10-CM | POA: Diagnosis not present

## 2016-03-11 DIAGNOSIS — Z1211 Encounter for screening for malignant neoplasm of colon: Secondary | ICD-10-CM | POA: Diagnosis not present

## 2016-03-11 NOTE — Progress Notes (Signed)
BP 132/88   Pulse 77   Temp 97.1 F (36.2 C) (Oral)   Ht 5' 8.5" (1.74 m)   Wt 214 lb 4 oz (97.2 kg)   BMI 32.10 kg/m    Subjective:    Patient ID: [name] [name], female    DOB: [birth year], 65 y.o.   MRN: [id number]  HPI: [name] [name] is a 65 y.o. female presenting on 03/11/2016 for Body cramps (patient reports she is cramping all over body, feet, hands, legs, arms and neck)   HPI Muscle cramps at night Patient complains of muscle cramps and body cramps and aches throughout her muscles at nighttime. She says she does not have it during the daytime. She does not have it every night but does have it more often than not. She says it wakes her up at night and she has to walk around to help with that. She says she gets it in her feet and hands arms and legs and sometimes even in her neck. She denies any weakness or numbness in either of her legs.  Relevant past medical, surgical, family and social history reviewed and updated as indicated. Interim medical history since our last visit reviewed. Allergies and medications reviewed and updated.  Review of Systems  Constitutional: Positive for fatigue. Negative for chills and fever.  HENT: Negative for congestion, ear discharge and ear pain.   Eyes: Negative for redness and visual disturbance.  Respiratory: Negative for chest tightness and shortness of breath.   Cardiovascular: Negative for chest pain and leg swelling.  Genitourinary: Negative for difficulty urinating and dysuria.  Musculoskeletal: Positive for myalgias. Negative for back pain and gait problem.  Skin: Negative for rash.  Neurological: Negative for tremors, weakness, light-headedness, numbness and headaches.  Psychiatric/Behavioral: Negative for agitation and behavioral problems.  All other systems reviewed and are negative.   Per HPI unless specifically indicated above     Objective:    BP 132/88   Pulse 77   Temp 97.1 F (36.2 C) (Oral)   Ht 5' 8.5" (1.74  m)   Wt 214 lb 4 oz (97.2 kg)   BMI 32.10 kg/m   Wt Readings from Last 3 Encounters:  03/11/16 214 lb 4 oz (97.2 kg)  02/20/16 219 lb (99.3 kg)  01/23/16 217 lb (98.4 kg)    Physical Exam  Constitutional: She is oriented to person, place, and time. She appears well-developed and well-nourished. No distress.  Eyes: Conjunctivae are normal.  Cardiovascular: Normal rate, regular rhythm, normal heart sounds and intact distal pulses.   No murmur heard. Pulmonary/Chest: Effort normal and breath sounds normal. No respiratory distress. She has no wheezes. She has no rales.  Musculoskeletal: Normal range of motion. She exhibits no edema or tenderness (No tenderness on exam).  Neurological: She is alert and oriented to person, place, and time. No cranial nerve deficit. Coordination normal.  Skin: Skin is warm and dry. No rash noted. She is not diaphoretic.  Psychiatric: She has a normal mood and affect. Her behavior is normal.  Nursing note and vitals reviewed.     Assessment & Plan:   Problem List Items Addressed This Visit    None    Visit Diagnoses    Muscle cramps at night    -  Primary   Started having muscle cramps with hydrochlorothiazide, switch to amlodipine and are improving, test electrolytes and hydrate well   Relevant Orders   BMP8+EGFR (Completed)   Magnesium (Completed)   Special screening for  malignant neoplasms, colon       Relevant Orders   Ambulatory referral to Gastroenterology   Postmenopausal           Follow up plan: Return if symptoms worsen or fail to improve.  Counseling provided for all of the vaccine components Orders Placed This Encounter  Procedures  . BMP8+EGFR  . Magnesium    Caryl Pina, MD [facility] 03/11/2016, 10:48 AM

## 2016-03-12 LAB — BMP8+EGFR
BUN/Creatinine Ratio: 23 (ref 12–28)
BUN: 20 mg/dL (ref 8–27)
CO2: 26 mmol/L (ref 18–29)
Calcium: 9.6 mg/dL (ref 8.7–10.3)
Chloride: 97 mmol/L (ref 96–106)
Creatinine, Ser: 0.86 mg/dL (ref 0.57–1.00)
GFR calc Af Amer: 83 mL/min/{1.73_m2} (ref >59)
GFR calc non Af Amer: 72 mL/min/{1.73_m2} (ref >59)
Glucose: 97 mg/dL (ref 65–99)
Potassium: 5.4 mmol/L — ABNORMAL HIGH (ref 3.5–5.2)
Sodium: 138 mmol/L (ref 134–144)

## 2016-03-12 LAB — MAGNESIUM: Magnesium: 2.4 mg/dL — ABNORMAL HIGH (ref 1.6–2.3)

## 2016-03-19 ENCOUNTER — Ambulatory Visit: Payer: Medicare HMO | Admitting: Family Medicine

## 2016-04-14 ENCOUNTER — Telehealth: Payer: Self-pay

## 2016-04-16 ENCOUNTER — Telehealth: Payer: Self-pay

## 2016-04-16 DIAGNOSIS — G5603 Carpal tunnel syndrome, bilateral upper limbs: Secondary | ICD-10-CM

## 2016-04-16 NOTE — Telephone Encounter (Signed)
Left message to call back jkp 1/11

## 2016-04-16 NOTE — Telephone Encounter (Signed)
x

## 2016-04-17 NOTE — Telephone Encounter (Signed)
Pt would like a referral to Dr. Ophelia CharterYates at [facility] for carpal tunnel. The injection that you gave her did not work.

## 2016-04-19 NOTE — Telephone Encounter (Signed)
Okay go ahead and put in the referral

## 2016-04-20 NOTE — Telephone Encounter (Signed)
Left message, referral in process.

## 2016-05-06 DIAGNOSIS — M19042 Primary osteoarthritis, left hand: Secondary | ICD-10-CM | POA: Diagnosis not present

## 2016-05-06 DIAGNOSIS — M19041 Primary osteoarthritis, right hand: Secondary | ICD-10-CM | POA: Diagnosis not present

## 2016-05-06 DIAGNOSIS — G5603 Carpal tunnel syndrome, bilateral upper limbs: Secondary | ICD-10-CM | POA: Diagnosis not present

## 2016-05-12 ENCOUNTER — Ambulatory Visit (INDEPENDENT_AMBULATORY_CARE_PROVIDER_SITE_OTHER): Payer: Medicare HMO | Admitting: Orthopaedic Surgery

## 2016-05-12 DIAGNOSIS — G5603 Carpal tunnel syndrome, bilateral upper limbs: Secondary | ICD-10-CM | POA: Diagnosis not present

## 2016-05-13 DIAGNOSIS — M19041 Primary osteoarthritis, right hand: Secondary | ICD-10-CM | POA: Diagnosis not present

## 2016-05-13 DIAGNOSIS — G5603 Carpal tunnel syndrome, bilateral upper limbs: Secondary | ICD-10-CM | POA: Diagnosis not present

## 2016-05-14 ENCOUNTER — Other Ambulatory Visit: Payer: Self-pay | Admitting: Orthopedic Surgery

## 2016-05-19 ENCOUNTER — Other Ambulatory Visit: Payer: Self-pay | Admitting: Orthopedic Surgery

## 2016-05-19 DIAGNOSIS — G5603 Carpal tunnel syndrome, bilateral upper limbs: Secondary | ICD-10-CM | POA: Diagnosis not present

## 2016-05-21 ENCOUNTER — Encounter (HOSPITAL_BASED_OUTPATIENT_CLINIC_OR_DEPARTMENT_OTHER): Payer: Self-pay | Admitting: *Deleted

## 2016-05-27 ENCOUNTER — Other Ambulatory Visit: Payer: Self-pay

## 2016-05-27 ENCOUNTER — Encounter (HOSPITAL_BASED_OUTPATIENT_CLINIC_OR_DEPARTMENT_OTHER)
Admission: RE | Admit: 2016-05-27 | Discharge: 2016-05-27 | Disposition: A | Discharge status: Home / Self Care | Payer: Medicare HMO | Source: Ambulatory Visit | Attending: Orthopedic Surgery | Admitting: Orthopedic Surgery

## 2016-05-27 DIAGNOSIS — I10 Essential (primary) hypertension: Secondary | ICD-10-CM | POA: Diagnosis not present

## 2016-05-27 DIAGNOSIS — M1990 Unspecified osteoarthritis, unspecified site: Secondary | ICD-10-CM | POA: Diagnosis not present

## 2016-05-27 DIAGNOSIS — E669 Obesity, unspecified: Secondary | ICD-10-CM | POA: Diagnosis not present

## 2016-05-27 DIAGNOSIS — J45909 Unspecified asthma, uncomplicated: Secondary | ICD-10-CM | POA: Diagnosis not present

## 2016-05-27 DIAGNOSIS — D649 Anemia, unspecified: Secondary | ICD-10-CM | POA: Diagnosis not present

## 2016-05-27 DIAGNOSIS — Z6832 Body mass index (BMI) 32.0-32.9, adult: Secondary | ICD-10-CM | POA: Diagnosis not present

## 2016-05-27 DIAGNOSIS — Z79899 Other long term (current) drug therapy: Secondary | ICD-10-CM | POA: Diagnosis not present

## 2016-05-27 DIAGNOSIS — G5602 Carpal tunnel syndrome, left upper limb: Secondary | ICD-10-CM | POA: Diagnosis not present

## 2016-05-27 NOTE — Telephone Encounter (Signed)
Patient was referred to orthopedic for carpal tunnel of left hand

## 2016-05-28 ENCOUNTER — Encounter (HOSPITAL_BASED_OUTPATIENT_CLINIC_OR_DEPARTMENT_OTHER)
Admission: RE | Disposition: A | Discharge status: Home / Self Care | Payer: Self-pay | Source: Ambulatory Visit | Attending: Orthopedic Surgery

## 2016-05-28 ENCOUNTER — Ambulatory Visit (HOSPITAL_BASED_OUTPATIENT_CLINIC_OR_DEPARTMENT_OTHER): Payer: Medicare HMO | Admitting: Anesthesiology

## 2016-05-28 ENCOUNTER — Ambulatory Visit (HOSPITAL_BASED_OUTPATIENT_CLINIC_OR_DEPARTMENT_OTHER)
Admission: RE | Admit: 2016-05-28 | Discharge: 2016-05-28 | Disposition: A | Discharge status: Home / Self Care | Payer: Medicare HMO | Source: Ambulatory Visit | Attending: Orthopedic Surgery | Admitting: Orthopedic Surgery

## 2016-05-28 ENCOUNTER — Encounter (HOSPITAL_BASED_OUTPATIENT_CLINIC_OR_DEPARTMENT_OTHER): Payer: Self-pay | Admitting: Anesthesiology

## 2016-05-28 DIAGNOSIS — M1990 Unspecified osteoarthritis, unspecified site: Secondary | ICD-10-CM | POA: Insufficient documentation

## 2016-05-28 DIAGNOSIS — G5602 Carpal tunnel syndrome, left upper limb: Secondary | ICD-10-CM | POA: Insufficient documentation

## 2016-05-28 DIAGNOSIS — J45909 Unspecified asthma, uncomplicated: Secondary | ICD-10-CM | POA: Insufficient documentation

## 2016-05-28 DIAGNOSIS — D649 Anemia, unspecified: Secondary | ICD-10-CM | POA: Diagnosis not present

## 2016-05-28 DIAGNOSIS — I10 Essential (primary) hypertension: Secondary | ICD-10-CM | POA: Diagnosis not present

## 2016-05-28 DIAGNOSIS — Z6832 Body mass index (BMI) 32.0-32.9, adult: Secondary | ICD-10-CM | POA: Insufficient documentation

## 2016-05-28 DIAGNOSIS — E669 Obesity, unspecified: Secondary | ICD-10-CM | POA: Diagnosis not present

## 2016-05-28 DIAGNOSIS — Z79899 Other long term (current) drug therapy: Secondary | ICD-10-CM | POA: Diagnosis not present

## 2016-05-28 HISTORY — PX: CARPAL TUNNEL RELEASE: SHX101

## 2016-05-28 LAB — POCT HEMOGLOBIN-HEMACUE: Hemoglobin: 11.3 g/dL — ABNORMAL LOW (ref 12.0–15.0)

## 2016-05-28 SURGERY — CARPAL TUNNEL RELEASE
Anesthesia: Monitor Anesthesia Care | Site: Wrist | Laterality: Left

## 2016-05-28 MED ORDER — HYDROCODONE-ACETAMINOPHEN 5-325 MG PO TABS: 1.0000 | ORAL_TABLET | Freq: Four times a day (QID) | ORAL | 0 refills | Status: DC | PRN

## 2016-05-28 MED ORDER — MIDAZOLAM HCL 2 MG/2ML IJ SOLN
INTRAMUSCULAR | Status: AC
  Filled 2016-05-28: qty 2

## 2016-05-28 MED ORDER — FENTANYL CITRATE (PF) 100 MCG/2ML IJ SOLN
25.0000 ug | INTRAMUSCULAR | Status: DC | PRN
  Administered 2016-05-28: 25 ug via INTRAVENOUS

## 2016-05-28 MED ORDER — ONDANSETRON HCL 4 MG/2ML IJ SOLN: 4.0000 mg | Freq: Once | INTRAMUSCULAR | Status: DC | PRN

## 2016-05-28 MED ORDER — LACTATED RINGERS IV SOLN
INTRAVENOUS | Status: DC
  Administered 2016-05-28: 09:00:00 via INTRAVENOUS

## 2016-05-28 MED ORDER — KETOROLAC TROMETHAMINE 30 MG/ML IJ SOLN: 30.0000 mg | Freq: Once | INTRAMUSCULAR | Status: DC

## 2016-05-28 MED ORDER — LIDOCAINE HCL (CARDIAC) 20 MG/ML IV SOLN
INTRAVENOUS | Status: DC | PRN
  Administered 2016-05-28: 30 mg via INTRAVENOUS

## 2016-05-28 MED ORDER — SCOPOLAMINE 1 MG/3DAYS TD PT72: 1.0000 | MEDICATED_PATCH | Freq: Once | TRANSDERMAL | Status: DC | PRN

## 2016-05-28 MED ORDER — OXYCODONE HCL 5 MG/5ML PO SOLN: 5.0000 mg | Freq: Once | ORAL | Status: DC | PRN

## 2016-05-28 MED ORDER — ACETAMINOPHEN 160 MG/5ML PO SOLN: 325.0000 mg | ORAL | Status: DC | PRN

## 2016-05-28 MED ORDER — MEPERIDINE HCL 25 MG/ML IJ SOLN: 6.2500 mg | INTRAMUSCULAR | Status: DC | PRN

## 2016-05-28 MED ORDER — MIDAZOLAM HCL 2 MG/2ML IJ SOLN
1.0000 mg | INTRAMUSCULAR | Status: DC | PRN
  Administered 2016-05-28: 2 mg via INTRAVENOUS

## 2016-05-28 MED ORDER — FENTANYL CITRATE (PF) 100 MCG/2ML IJ SOLN
INTRAMUSCULAR | Status: AC
  Filled 2016-05-28: qty 2

## 2016-05-28 MED ORDER — BUPIVACAINE HCL (PF) 0.25 % IJ SOLN
INTRAMUSCULAR | Status: AC
  Filled 2016-05-28: qty 30

## 2016-05-28 MED ORDER — CEFAZOLIN SODIUM-DEXTROSE 2-4 GM/100ML-% IV SOLN
2.0000 g | INTRAVENOUS | Status: AC
  Administered 2016-05-28: 2 g via INTRAVENOUS

## 2016-05-28 MED ORDER — ACETAMINOPHEN 325 MG PO TABS: 325.0000 mg | ORAL_TABLET | ORAL | Status: DC | PRN

## 2016-05-28 MED ORDER — BUPIVACAINE HCL (PF) 0.25 % IJ SOLN
INTRAMUSCULAR | Status: DC | PRN
  Administered 2016-05-28: 9 mL

## 2016-05-28 MED ORDER — OXYCODONE HCL 5 MG PO TABS: 5.0000 mg | ORAL_TABLET | Freq: Once | ORAL | Status: DC | PRN

## 2016-05-28 MED ORDER — ONDANSETRON HCL 4 MG/2ML IJ SOLN
INTRAMUSCULAR | Status: DC | PRN
  Administered 2016-05-28: 4 mg via INTRAVENOUS

## 2016-05-28 MED ORDER — CEFAZOLIN SODIUM-DEXTROSE 2-4 GM/100ML-% IV SOLN
INTRAVENOUS | Status: AC
  Filled 2016-05-28: qty 100

## 2016-05-28 MED ORDER — FENTANYL CITRATE (PF) 100 MCG/2ML IJ SOLN
50.0000 ug | INTRAMUSCULAR | Status: DC | PRN
  Administered 2016-05-28: 100 ug via INTRAVENOUS

## 2016-05-28 MED ORDER — CHLORHEXIDINE GLUCONATE 4 % EX LIQD: 60.0000 mL | Freq: Once | CUTANEOUS | Status: DC

## 2016-05-28 MED ORDER — PROPOFOL 10 MG/ML IV BOLUS
INTRAVENOUS | Status: DC | PRN
Start: 2016-05-28 — End: 2016-05-28
  Administered 2016-05-28 (×2): 20 mg via INTRAVENOUS

## 2016-05-28 SURGICAL SUPPLY — 34 items
BLADE SURG 15 STRL LF DISP TIS (BLADE) ×1 IMPLANT
BLADE SURG 15 STRL SS (BLADE) ×1
BNDG COHESIVE 3X5 TAN STRL LF (GAUZE/BANDAGES/DRESSINGS) ×2 IMPLANT
BNDG ESMARK 4X9 LF (GAUZE/BANDAGES/DRESSINGS) IMPLANT
BNDG GAUZE ELAST 4 BULKY (GAUZE/BANDAGES/DRESSINGS) ×2 IMPLANT
CHLORAPREP W/TINT 26ML (MISCELLANEOUS) ×2 IMPLANT
CORDS BIPOLAR (ELECTRODE) ×2 IMPLANT
COVER BACK TABLE 60X90IN (DRAPES) ×2 IMPLANT
COVER MAYO STAND STRL (DRAPES) ×2 IMPLANT
CUFF TOURNIQUET SINGLE 18IN (TOURNIQUET CUFF) ×2 IMPLANT
DRAPE EXTREMITY T 121X128X90 (DRAPE) ×2 IMPLANT
DRAPE SURG 17X23 STRL (DRAPES) ×2 IMPLANT
DRSG PAD ABDOMINAL 8X10 ST (GAUZE/BANDAGES/DRESSINGS) ×2 IMPLANT
GAUZE SPONGE 4X4 12PLY STRL (GAUZE/BANDAGES/DRESSINGS) ×2 IMPLANT
GAUZE XEROFORM 1X8 LF (GAUZE/BANDAGES/DRESSINGS) ×2 IMPLANT
GLOVE BIO SURGEON STRL SZ 6.5 (GLOVE) ×4 IMPLANT
GLOVE BIOGEL PI IND STRL 7.0 (GLOVE) ×3 IMPLANT
GLOVE BIOGEL PI IND STRL 8.5 (GLOVE) ×1 IMPLANT
GLOVE BIOGEL PI INDICATOR 7.0 (GLOVE) ×3
GLOVE BIOGEL PI INDICATOR 8.5 (GLOVE) ×1
GLOVE SURG ORTHO 8.0 STRL STRW (GLOVE) ×2 IMPLANT
GOWN STRL REUS W/ TWL LRG LVL3 (GOWN DISPOSABLE) ×2 IMPLANT
GOWN STRL REUS W/TWL LRG LVL3 (GOWN DISPOSABLE) ×2
GOWN STRL REUS W/TWL XL LVL3 (GOWN DISPOSABLE) ×2 IMPLANT
NEEDLE PRECISIONGLIDE 27X1.5 (NEEDLE) IMPLANT
NS IRRIG 1000ML POUR BTL (IV SOLUTION) ×2 IMPLANT
PACK BASIN DAY SURGERY FS (CUSTOM PROCEDURE TRAY) ×2 IMPLANT
STOCKINETTE 4X48 STRL (DRAPES) ×2 IMPLANT
SUT ETHILON 4 0 PS 2 18 (SUTURE) ×2 IMPLANT
SUT VICRYL 4-0 PS2 18IN ABS (SUTURE) IMPLANT
SYR BULB 3OZ (MISCELLANEOUS) ×2 IMPLANT
SYR CONTROL 10ML LL (SYRINGE) IMPLANT
TOWEL OR 17X24 6PK STRL BLUE (TOWEL DISPOSABLE) ×2 IMPLANT
UNDERPAD 30X30 (UNDERPADS AND DIAPERS) ×2 IMPLANT

## 2016-05-28 NOTE — Op Note (Signed)
Dictation Number [id number]

## 2016-05-28 NOTE — Brief Op Note (Signed)
05/28/2016  10:15 AM  PATIENT:  [name] [name]  66 y.o. female  PRE-OPERATIVE DIAGNOSIS:  LEFT CARPAL TUNNEL SYNDROME  POST-OPERATIVE DIAGNOSIS:  LEFT CARPAL TUNNEL SYNDROME  PROCEDURE:  Procedure(s): CARPAL TUNNEL RELEASE (Left)  SURGEON:  Surgeon(s) and Role:    * Cindee SaltGary Emiliana Blaize, MD - Primary  PHYSICIAN ASSISTANT:   ASSISTANTS: none   ANESTHESIA:   local and regional  EBL:  Total I/O In: 500 [I.V.:500] Out: -   BLOOD ADMINISTERED:none  DRAINS: none   LOCAL MEDICATIONS USED:  BUPIVICAINE   SPECIMEN:  No Specimen  DISPOSITION OF SPECIMEN:  N/A  COUNTS:  YES  TOURNIQUET:   Total Tourniquet Time Documented: Forearm (Left) - 20 minutes Total: Forearm (Left) - 20 minutes   DICTATION: .Other Dictation: Dictation Number [id number]  PLAN OF CARE: Discharge to home after PACU  PATIENT DISPOSITION:  PACU - hemodynamically stable.

## 2016-05-28 NOTE — Anesthesia Postprocedure Evaluation (Signed)
Anesthesia Post Note  Patient: [name] [name]  Procedure(s) Performed: Procedure(s) (LRB): CARPAL TUNNEL RELEASE (Left)  Patient location during evaluation: PACU Anesthesia Type: MAC and Bier Block Level of consciousness: awake Pain management: pain level controlled Vital Signs Assessment: post-procedure vital signs reviewed and stable Respiratory status: spontaneous breathing Cardiovascular status: stable Postop Assessment: no signs of nausea or vomiting Anesthetic complications: no        Last Vitals:  Vitals:   05/28/16 1045 05/28/16 1100  BP: 131/79 125/89  Pulse: 72 73  Resp: 20 (!) 22  Temp:      Last Pain:  Vitals:   05/28/16 1059  TempSrc:   PainSc: 2    Pain Goal: Patients Stated Pain Goal: 0 (05/28/16 0851)               Krisy Dix JR,[name]

## 2016-05-28 NOTE — Transfer of Care (Signed)
Immediate Anesthesia Transfer of Care Note  Patient: [place] A Tibbits  Procedure(s) Performed: Procedure(s): CARPAL TUNNEL RELEASE (Left)  Patient Location: PACU  Anesthesia Type:MAC and Bier block  Level of Consciousness: awake, alert  and oriented  Airway & Oxygen Therapy: Patient Spontanous Breathing and Patient connected to nasal cannula oxygen  Post-op Assessment: Report given to RN and Post -op Vital signs reviewed and stable  Post vital signs: Reviewed and stable  Last Vitals:  Vitals:   05/28/16 0851  BP: (!) 141/87  Pulse: 75  Resp: 18  Temp: 36.7 C    Last Pain:  Vitals:   05/28/16 0851  TempSrc: Oral      Patients Stated Pain Goal: 0 (19/41/74 0814)  Complications: No apparent anesthesia complications

## 2016-05-28 NOTE — Op Note (Signed)
NAMEJMYA, ULIANO               ACCOUNT NO.:  [id number]  MEDICAL RECORD NO.:  [id number]  LOCATION:                                 FACILITY:  PHYSICIAN:  Cindee Salt, M.D.            DATE OF BIRTH:  DATE OF PROCEDURE:  05/28/2016 DATE OF DISCHARGE:                              OPERATIVE REPORT   PREOPERATIVE DIAGNOSIS:  Carpal tunnel syndrome, left hand.  POSTOPERATIVE DIAGNOSIS:  Carpal tunnel syndrome, left hand.  OPERATION:  Decompression of left median nerve.  SURGEON:  Cindee Salt, M.D.  ANESTHESIA:  Forearm-based IV regional with local infiltration.  PLACE OF SURGERY:  [facility].  HISTORY:  The patient is a 66 year old female with a history of carpal tunnel syndrome with positive nerve conductions, unresponsive to conservative treatment.  She has elected to undergo surgical decompression to the left median nerve.  Pre, peri, and postoperative course have been discussed along with risks and complications.  She is aware there is no guarantee to the surgery; the possibility of infection; recurrence of injury to arteries, nerves, tendons; incomplete relief of symptoms; and dystrophy.  In preoperative area, the patient was seen, the extremity was marked by both patient and surgeon. Antibiotic was also given.  PROCEDURE IN DETAIL:  The patient was brought to the operating room, where a forearm-based IV regional anesthetic was carried out without difficulty under the direction of the Anesthesia Department.  She was prepped using ChloraPrep in supine position with the left arm free.  A 3- minute dry time was allowed and time-out taken confirming the patient and procedure.  A longitudinal incision was made in the left palm, carried down through subcutaneous tissue.  Bleeders were electrocauterized with bipolar.  The palmar fascia was split, and the superficial palmar arch identified.  The flexor tendon to the ring and little finger were identified.  Retractors  were placed protecting the median nerve radially and the ulnar nerve ulnarly.  The flexor retinaculum was then released on its ulnar aspect.  A right angle and Sewall retractor were placed between skin and forearm fascia.  The underlying nerve and tendons were then dissected from the proximal aspect of the flexor retinaculum distal forearm fascia.  Blunt scissors were then used to transect the remainder of the flexor retinaculum and distal forearm fascia for approximately 2 cm proximal to the wrist crease under direct vision.  The canal was explored.  An area of compression to the nerve was apparent.  The motor branch entered in the muscle.  No further lesions were identified.  The wound was copiously irrigated with saline.  The skin was then closed with interrupted 4-0 nylon sutures.  A local infiltration with 0.25% bupivacaine without epinephrine was given, approximately 8 mL was used.  Sterile compressive dressing with the fingers free was applied.  On deflation of the tourniquet, all fingers immediately pinked.  She was taken to the recovery room for observation in satisfactory condition.  She will be discharged home to return to the [facility] in 1 week, on Norco.          ______________________________ Cindee Salt, M.D.  GK/MEDQ  D:  05/28/2016  T:  05/28/2016  Job:  [id number]
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
## 2016-05-28 NOTE — Discharge Instructions (Addendum)

## 2016-05-28 NOTE — Anesthesia Procedure Notes (Signed)
Anesthesia Regional Block: Narrative:       

## 2016-05-28 NOTE — Anesthesia Preprocedure Evaluation (Signed)
Anesthesia Evaluation  Patient identified by MRN, date of birth, ID band Patient awake    Reviewed: Allergy & Precautions, NPO status , Patient's Chart, lab work & pertinent test results  Airway Mallampati: I       Dental no notable dental hx.    Pulmonary    Pulmonary exam normal        Cardiovascular hypertension, Pt. on medications Normal cardiovascular exam     Neuro/Psych negative neurological ROS  negative psych ROS   GI/Hepatic negative GI ROS, Neg liver ROS,   Endo/Other  negative endocrine ROS  Renal/GU negative Renal ROS     Musculoskeletal   Abdominal (+) + obese,   Peds  Hematology   Anesthesia Other Findings   Reproductive/Obstetrics negative OB ROS                             Anesthesia Physical Anesthesia Plan  ASA: II  Anesthesia Plan: MAC and Bier Block   Post-op Pain Management:    Induction:   Airway Management Planned: Natural Airway and Simple Face Mask  Additional Equipment:   Intra-op Plan:   Post-operative Plan:   Informed Consent: I have reviewed the patients History and Physical, chart, labs and discussed the procedure including the risks, benefits and alternatives for the proposed anesthesia with the patient or authorized representative who has indicated his/her understanding and acceptance.     Plan Discussed with: CRNA and Surgeon  Anesthesia Plan Comments:         Anesthesia Quick Evaluation

## 2016-05-28 NOTE — Anesthesia Procedure Notes (Addendum)
Anesthesia Regional Block: Bier block (IV Regional)   Pre-Anesthetic Checklist: ,, timeout performed, Correct Patient, Correct Site, Correct Laterality, Correct Procedure,, site marked, surgical consent,, at surgeon's request Needles:  Injection technique: Single-shot  Needle Type: Other   (20 ga IV cath)    Needle Gauge: 20     Additional Needles:   Procedures:,,,,,,, Esmarch exsanguination, single tourniquet utilized,  Narrative:   Performed by: Personally

## 2016-05-29 ENCOUNTER — Encounter (HOSPITAL_BASED_OUTPATIENT_CLINIC_OR_DEPARTMENT_OTHER): Payer: Self-pay | Admitting: Orthopedic Surgery

## 2016-07-07 DIAGNOSIS — Z8781 Personal history of (healed) traumatic fracture: Secondary | ICD-10-CM | POA: Diagnosis not present

## 2016-07-07 DIAGNOSIS — M25511 Pain in right shoulder: Secondary | ICD-10-CM | POA: Diagnosis not present

## 2016-07-07 DIAGNOSIS — M1712 Unilateral primary osteoarthritis, left knee: Secondary | ICD-10-CM | POA: Diagnosis not present

## 2016-07-07 DIAGNOSIS — M19011 Primary osteoarthritis, right shoulder: Secondary | ICD-10-CM | POA: Diagnosis not present

## 2016-07-07 DIAGNOSIS — M7541 Impingement syndrome of right shoulder: Secondary | ICD-10-CM | POA: Diagnosis not present

## 2016-07-28 DIAGNOSIS — M1712 Unilateral primary osteoarthritis, left knee: Secondary | ICD-10-CM | POA: Diagnosis not present

## 2016-09-08 DIAGNOSIS — M5442 Lumbago with sciatica, left side: Secondary | ICD-10-CM | POA: Diagnosis not present

## 2016-09-08 DIAGNOSIS — I10 Essential (primary) hypertension: Secondary | ICD-10-CM | POA: Diagnosis not present

## 2016-09-08 DIAGNOSIS — Z6832 Body mass index (BMI) 32.0-32.9, adult: Secondary | ICD-10-CM | POA: Diagnosis not present

## 2016-09-17 ENCOUNTER — Other Ambulatory Visit: Payer: Self-pay | Admitting: Obstetrics and Gynecology

## 2016-09-17 DIAGNOSIS — Z1231 Encounter for screening mammogram for malignant neoplasm of breast: Secondary | ICD-10-CM

## 2016-09-22 DIAGNOSIS — M11262 Other chondrocalcinosis, left knee: Secondary | ICD-10-CM | POA: Diagnosis not present

## 2016-09-22 DIAGNOSIS — M1712 Unilateral primary osteoarthritis, left knee: Secondary | ICD-10-CM | POA: Diagnosis not present

## 2016-09-29 ENCOUNTER — Ambulatory Visit (INDEPENDENT_AMBULATORY_CARE_PROVIDER_SITE_OTHER): Payer: Self-pay | Admitting: Orthopaedic Surgery

## 2016-10-02 ENCOUNTER — Ambulatory Visit: Payer: Commercial Managed Care - HMO

## 2016-10-26 DIAGNOSIS — Z01411 Encounter for gynecological examination (general) (routine) with abnormal findings: Secondary | ICD-10-CM | POA: Diagnosis not present

## 2016-10-26 DIAGNOSIS — Z124 Encounter for screening for malignant neoplasm of cervix: Secondary | ICD-10-CM | POA: Diagnosis not present

## 2016-10-26 DIAGNOSIS — R350 Frequency of micturition: Secondary | ICD-10-CM | POA: Diagnosis not present

## 2016-10-26 DIAGNOSIS — L0291 Cutaneous abscess, unspecified: Secondary | ICD-10-CM | POA: Diagnosis not present

## 2016-10-26 DIAGNOSIS — N6310 Unspecified lump in the right breast, unspecified quadrant: Secondary | ICD-10-CM | POA: Diagnosis not present

## 2016-11-04 ENCOUNTER — Other Ambulatory Visit: Payer: Self-pay | Admitting: Obstetrics and Gynecology

## 2016-11-04 DIAGNOSIS — N6310 Unspecified lump in the right breast, unspecified quadrant: Secondary | ICD-10-CM

## 2016-11-05 ENCOUNTER — Other Ambulatory Visit: Payer: Self-pay | Admitting: Obstetrics and Gynecology

## 2016-11-05 DIAGNOSIS — E2839 Other primary ovarian failure: Secondary | ICD-10-CM

## 2016-11-10 ENCOUNTER — Other Ambulatory Visit: Payer: Medicare Other

## 2016-11-11 ENCOUNTER — Other Ambulatory Visit: Payer: Medicare Other

## 2016-11-18 ENCOUNTER — Ambulatory Visit: Payer: Medicare Other

## 2016-11-18 ENCOUNTER — Ambulatory Visit
Admission: RE | Admit: 2016-11-18 | Discharge: 2016-11-18 | Disposition: A | Discharge status: Home / Self Care | Payer: Medicare Other | Source: Ambulatory Visit | Attending: Obstetrics and Gynecology | Admitting: Obstetrics and Gynecology

## 2016-11-18 DIAGNOSIS — Z13820 Encounter for screening for osteoporosis: Secondary | ICD-10-CM | POA: Diagnosis not present

## 2016-11-18 DIAGNOSIS — N6310 Unspecified lump in the right breast, unspecified quadrant: Secondary | ICD-10-CM

## 2016-11-18 DIAGNOSIS — E2839 Other primary ovarian failure: Secondary | ICD-10-CM

## 2016-11-18 DIAGNOSIS — R928 Other abnormal and inconclusive findings on diagnostic imaging of breast: Secondary | ICD-10-CM | POA: Diagnosis not present

## 2016-11-18 DIAGNOSIS — Z780 Asymptomatic menopausal state: Secondary | ICD-10-CM | POA: Diagnosis not present

## 2016-11-26 DIAGNOSIS — M5442 Lumbago with sciatica, left side: Secondary | ICD-10-CM | POA: Diagnosis not present

## 2016-12-08 DIAGNOSIS — Z1211 Encounter for screening for malignant neoplasm of colon: Secondary | ICD-10-CM | POA: Diagnosis not present

## 2016-12-08 DIAGNOSIS — K5904 Chronic idiopathic constipation: Secondary | ICD-10-CM | POA: Diagnosis not present

## 2016-12-08 DIAGNOSIS — D509 Iron deficiency anemia, unspecified: Secondary | ICD-10-CM | POA: Diagnosis not present

## 2016-12-10 DIAGNOSIS — Z1211 Encounter for screening for malignant neoplasm of colon: Secondary | ICD-10-CM | POA: Diagnosis not present

## 2016-12-10 DIAGNOSIS — D509 Iron deficiency anemia, unspecified: Secondary | ICD-10-CM | POA: Diagnosis not present

## 2016-12-10 DIAGNOSIS — K5904 Chronic idiopathic constipation: Secondary | ICD-10-CM | POA: Diagnosis not present

## 2016-12-16 DIAGNOSIS — Z1211 Encounter for screening for malignant neoplasm of colon: Secondary | ICD-10-CM | POA: Diagnosis not present

## 2016-12-16 DIAGNOSIS — D509 Iron deficiency anemia, unspecified: Secondary | ICD-10-CM | POA: Diagnosis not present

## 2016-12-16 DIAGNOSIS — K5730 Diverticulosis of large intestine without perforation or abscess without bleeding: Secondary | ICD-10-CM | POA: Diagnosis not present

## 2016-12-18 ENCOUNTER — Ambulatory Visit: Payer: Self-pay | Admitting: Family Medicine

## 2016-12-18 ENCOUNTER — Encounter: Payer: Self-pay | Admitting: Family Medicine

## 2016-12-22 ENCOUNTER — Ambulatory Visit (INDEPENDENT_AMBULATORY_CARE_PROVIDER_SITE_OTHER): Payer: Medicare Other | Admitting: Orthopaedic Surgery

## 2016-12-22 ENCOUNTER — Ambulatory Visit (INDEPENDENT_AMBULATORY_CARE_PROVIDER_SITE_OTHER): Payer: Medicare Other

## 2016-12-22 ENCOUNTER — Encounter (INDEPENDENT_AMBULATORY_CARE_PROVIDER_SITE_OTHER): Payer: Self-pay | Admitting: Orthopaedic Surgery

## 2016-12-22 VITALS — BP 130/77 | HR 73 | Ht 68.5 in | Wt 216.0 lb

## 2016-12-22 DIAGNOSIS — G8929 Other chronic pain: Secondary | ICD-10-CM

## 2016-12-22 DIAGNOSIS — M545 Low back pain: Secondary | ICD-10-CM

## 2016-12-22 MED ORDER — TRAMADOL HCL 50 MG PO TABS: 50.0000 mg | ORAL_TABLET | Freq: Two times a day (BID) | ORAL | 0 refills | Status: DC | PRN

## 2016-12-22 NOTE — Progress Notes (Signed)
Office Visit Note   Patient: [name] [name]           Date of Birth: 10/31/1950           MRN: [id number] Visit Date: 12/22/2016              Requested by: Dettinger, Elige Radon, MD [address] [place], [place] [address] PCP: Dettinger, Elige Radon, MD   Assessment & Plan: Visit Diagnoses:  1. Chronic bilateral low back pain, with sciatica presence unspecified     Plan: We discussed a walking program she can try to do some core strengthening exercises. Ultram 30 tablets prescribed for increased pain. She likely has some progressive dyspnea degeneration at L4-5 response for 4 symptoms with back and leg pain. Will order some physical therapy and recheck her in 5 weeks.  Follow-Up Instructions: Return in about 5 weeks (around 01/26/2017).   Orders:  Orders Placed This Encounter  Procedures  . XR Lumbar Spine 2-3 Views   Meds ordered this encounter  Medications  . traMADol (ULTRAM) 50 MG tablet    Sig: Take 1 tablet (50 mg total) by mouth every 12 (twelve) hours as needed.    Dispense:  30 tablet    Refill:  0      Procedures: No procedures performed   Clinical Data: No additional findings.   Subjective: Chief Complaint  Patient presents with  . Lower Back - Pain    HPI 66 year old female returns are not seen her in several years. She was last seen 2011 had chronic back pain with this significant narrowing at the L5-S1. Previous MRI scan 2011 showed disc bulging at L2-3, 3-4,l 4-5 and also L5-S1. Patient's pain is been intermittent recently been more severe the last few weeks. She normally gets some relief with walking and resting but states this time is not gotten better. Sentinel prescription for Vicodin  Review of Systems  Constitutional: Negative for chills and diaphoresis.  HENT: Negative for ear discharge, ear pain and nosebleeds.   Eyes: Negative for discharge and visual disturbance.  Respiratory: Negative for cough, choking and shortness of breath.     Cardiovascular: Negative for chest pain and palpitations.  Gastrointestinal: Negative for abdominal distention and abdominal pain.  Endocrine: Negative for cold intolerance and heat intolerance.  Genitourinary: Negative for flank pain and hematuria.  Skin: Negative for rash and wound.  Neurological: Negative for seizures and speech difficulty.  Hematological: Negative for adenopathy. Does not bruise/bleed easily.  Psychiatric/Behavioral: Negative for agitation and suicidal ideas.   positive for some depression related to the murder of her son playED  basketball.   Objective: Vital Signs: BP 130/77   Pulse 73   Ht 5' 8.5" (1.74 m)   Wt 216 lb (98 kg)   BMI 32.36 kg/m   Physical Exam  Constitutional: She is oriented to person, place, and time. She appears well-developed.  HENT:  Head: Normocephalic.  Right Ear: External ear normal.  Left Ear: External ear normal.  Eyes: Pupils are equal, round, and reactive to light.  Neck: No tracheal deviation present. No thyromegaly present.  Cardiovascular: Normal rate.   Pulmonary/Chest: Effort normal.  Abdominal: Soft.  Neurological: She is alert and oriented to person, place, and time.  Skin: Skin is warm and dry.  Psychiatric: She has a normal mood and affect. Her behavior is normal.    Ortho Exam a straight leg raising 90. She is some crepitus knee range of motion on her left knee collateral  ligaments are stable. Normal heel toe gait anterior tib EHL is intact. Reflexes are intact. She has some tenderness lumbosacral junction. Normal hip range of motion no pain with internal/external rotation.  Specialty Comments:  No specialty comments available.  Imaging: Xr Lumbar Spine 2-3 Views  Result Date: 12/22/2016 AP lateral lumbar x-rays obtained and reviewed. This shows significant disc space narrowing at L5-S1 corresponding with previous MRI scan in 2011. Patient has new grade 1 anterolisthesis at L4-5. Facet degenerative changes  noted. Impression: Lumbar disc degeneration with new anterolisthesis L4-5. Significant narrowing L5-S1 is still present.    PMFS History: Patient Active Problem List   Diagnosis Date Noted  . Essential hypertension, benign 02/20/2016  . Elevated blood pressure reading 09/29/2011  . Chronic back pain 09/29/2011  . Leg edema 09/29/2011  . Obesity 09/29/2011   Past Medical History:  Diagnosis Date  . Allergy   . Anemia   . Arthritis   . Asthma   . Back pain     Family History  Problem Relation Age of Onset  . Asthma Mother   . Cancer Mother        AML  . Leukemia Mother   . Heart disease Father   . Hypertension Father   . Kidney disease Father   . Breast cancer Maternal Aunt     Past Surgical History:  Procedure Laterality Date  . ABDOMINAL HYSTERECTOMY  1989   fibroid-no longer needs pap  . BACK SURGERY  1987, 1985   DDD  . CARPAL TUNNEL RELEASE Left 05/28/2016   Procedure: CARPAL TUNNEL RELEASE;  Surgeon: Cindee Salt, MD;  Location: [facility];  Service: Orthopedics;  Laterality: Left;  . TUBAL LIGATION     Social History   Occupational History  . Not on file.   Social History Main Topics  . Smoking status: Never Smoker  . Smokeless tobacco: Never Used  . Alcohol use No  . Drug use: No  . Sexual activity: Not Currently

## 2016-12-22 NOTE — Addendum Note (Signed)
Addended by: Rogers Seeds on: 12/22/2016 10:04 AM   Modules accepted: Orders

## 2016-12-31 ENCOUNTER — Telehealth (INDEPENDENT_AMBULATORY_CARE_PROVIDER_SITE_OTHER): Payer: Self-pay | Admitting: Radiology

## 2016-12-31 DIAGNOSIS — D508 Other iron deficiency anemias: Secondary | ICD-10-CM | POA: Diagnosis not present

## 2016-12-31 DIAGNOSIS — M5440 Lumbago with sciatica, unspecified side: Principal | ICD-10-CM

## 2016-12-31 DIAGNOSIS — E669 Obesity, unspecified: Secondary | ICD-10-CM | POA: Diagnosis not present

## 2016-12-31 DIAGNOSIS — G8929 Other chronic pain: Secondary | ICD-10-CM

## 2016-12-31 DIAGNOSIS — K5904 Chronic idiopathic constipation: Secondary | ICD-10-CM | POA: Diagnosis not present

## 2016-12-31 DIAGNOSIS — K5730 Diverticulosis of large intestine without perforation or abscess without bleeding: Secondary | ICD-10-CM | POA: Diagnosis not present

## 2016-12-31 NOTE — Telephone Encounter (Signed)
Per staff message sent from [place] with [facility], patient prefers to go to PT in [place] since it is closer to her home. New referral entered.

## 2017-01-07 DIAGNOSIS — R946 Abnormal results of thyroid function studies: Secondary | ICD-10-CM | POA: Diagnosis not present

## 2017-01-07 DIAGNOSIS — I10 Essential (primary) hypertension: Secondary | ICD-10-CM | POA: Diagnosis not present

## 2017-01-07 DIAGNOSIS — M545 Low back pain: Secondary | ICD-10-CM | POA: Diagnosis not present

## 2017-01-07 DIAGNOSIS — D508 Other iron deficiency anemias: Secondary | ICD-10-CM | POA: Diagnosis not present

## 2017-01-11 ENCOUNTER — Ambulatory Visit: Payer: Medicare Other | Admitting: Physical Therapy

## 2017-01-13 DIAGNOSIS — R7989 Other specified abnormal findings of blood chemistry: Secondary | ICD-10-CM | POA: Diagnosis not present

## 2017-01-13 DIAGNOSIS — D508 Other iron deficiency anemias: Secondary | ICD-10-CM | POA: Diagnosis not present

## 2017-01-13 DIAGNOSIS — R5383 Other fatigue: Secondary | ICD-10-CM | POA: Diagnosis not present

## 2017-01-13 DIAGNOSIS — D6489 Other specified anemias: Secondary | ICD-10-CM | POA: Diagnosis not present

## 2017-01-18 ENCOUNTER — Encounter: Payer: Self-pay | Admitting: Physical Therapy

## 2017-01-18 ENCOUNTER — Ambulatory Visit: Payer: Medicare Other | Attending: Orthopaedic Surgery | Admitting: Physical Therapy

## 2017-01-18 DIAGNOSIS — M6281 Muscle weakness (generalized): Secondary | ICD-10-CM | POA: Insufficient documentation

## 2017-01-18 DIAGNOSIS — M5442 Lumbago with sciatica, left side: Secondary | ICD-10-CM | POA: Diagnosis not present

## 2017-01-18 DIAGNOSIS — R262 Difficulty in walking, not elsewhere classified: Secondary | ICD-10-CM | POA: Diagnosis not present

## 2017-01-18 DIAGNOSIS — G8929 Other chronic pain: Secondary | ICD-10-CM | POA: Diagnosis not present

## 2017-01-20 DIAGNOSIS — R946 Abnormal results of thyroid function studies: Secondary | ICD-10-CM | POA: Diagnosis not present

## 2017-01-20 DIAGNOSIS — Z6839 Body mass index (BMI) 39.0-39.9, adult: Secondary | ICD-10-CM | POA: Diagnosis not present

## 2017-01-20 DIAGNOSIS — K640 First degree hemorrhoids: Secondary | ICD-10-CM | POA: Diagnosis not present

## 2017-01-20 DIAGNOSIS — D6489 Other specified anemias: Secondary | ICD-10-CM | POA: Diagnosis not present

## 2017-01-20 NOTE — Therapy (Signed)
[facility] [address] [place], [place], [address] Phone: [phone]   Fax:  [phone]  Physical Therapy Evaluation  Patient Details  Name: [name] [name] MRN: [id number] Date of Birth: 02/03/1951 Referring Provider: Annell GreeningMark Yates, MD  Encounter Date: 01/18/2017    Past Medical History:  Diagnosis Date  . Allergy   . Anemia   . Arthritis   . Asthma   . Back pain     Past Surgical History:  Procedure Laterality Date  . ABDOMINAL HYSTERECTOMY  1989   fibroid-no longer needs pap  . BACK SURGERY  1987, 1985   DDD  . CARPAL TUNNEL RELEASE Left 05/28/2016   Procedure: CARPAL TUNNEL RELEASE;  Surgeon: Cindee SaltGary Kuzma, MD;  Location: [facility];  Service: Orthopedics;  Laterality: Left;  . TUBAL LIGATION      There were no vitals filed for this visit.    Lumbar ROM:  Flexion: 75 degrees Extension: 5 degrees Bilateral side bending 15 degrees  Pt's L LE strength was grossly 4/5 compared to 5/5 on the right  Pt with antalgic gait pattern with decreased stance phase on the left.        F'oto: 58% limitation                          PT Long Term Goals - 01/20/17 [id number]      PT LONG TERM GOAL #2   Title Pt will be able to amb >30 minutes with no pain reported.    Time 8   Period Weeks   Status New     PT LONG TERM GOAL #3   Title pt will improve Left LE strength to 5/5 in order to improve functional mobility and gait.    Time 8   Period Weeks   Status New     PT LONG TERM GOAL #4   Title Pt will improve bilateral sidebending in order to improve fuction.    Time 8   Period Weeks   Status New     PT LONG TERM GOAL #5   Title Pt will be to improve trunk extension >/15 degrees in order to improve functional mobility.    Time 8   Period Weeks   Status New                Plan - 01/20/17 0815    Clinical Impression Statement Pt arriving to therapy as a low complexity  evaluation with low back pain which radiates down her L LE. Pt reporting intermittent pain which has been going on since 1985 folowing her back surgery after an injury at work. Pt presenting with LE weakness and limited ROM. Pt was issued a HEP and E-stim applied to lumbar paraspinals. Skilled PT needed to progres pt toward improved quality of life and functional mobility with the below interventions.    Clinical Presentation Evolving   Clinical Presentation due to: radiating pain down left LE   Clinical Decision Making Low   Rehab Potential Good   PT Frequency 2x / week   PT Duration 8 weeks   PT Treatment/Interventions ADLs/Self Care Home Management;Dry needling;Taping;Cryotherapy;Electrical Stimulation;Iontophoresis 4mg /ml Dexamethasone;Moist Heat;Ultrasound;Traction;Balance training;Therapeutic exercise;Therapeutic activities;Functional mobility training;Stair training;Gait training;Patient/family education;Manual techniques;Passive range of motion   PT Next Visit Plan core strengthening, hamstring stretching, lumbar stretches, modalities to tolerate, assess need for lumbar traction   PT Home Exercise Plan hamstring stretches, piriformis stretch, bridges   Consulted and Agree with  Plan of Care Patient      Patient will benefit from skilled therapeutic intervention in order to improve the following deficits and impairments:  Abnormal gait, Pain, Decreased mobility, Decreased balance, Decreased range of motion, Impaired flexibility, Improper body mechanics, Difficulty walking, Decreased strength  Visit Diagnosis: Chronic midline low back pain with left-sided sciatica  Difficulty in walking, not elsewhere classified  Muscle weakness (generalized)      G-Codes - 02/05/2017 0825    Functional Assessment Tool Used (Outpatient Only) Foto ( 58% limitiation on 01/18/17), clinical assessment   Functional Limitation Mobility: Walking and moving around   Mobility: Walking and Moving Around Current  Status [phone]) At least 40 percent but less than 60 percent impaired, limited or restricted   Mobility: Walking and Moving Around Goal Status [phone]) At least 40 percent but less than 60 percent impaired, limited or restricted       Problem List Patient Active Problem List   Diagnosis Date Noted  . Essential hypertension, benign 02/20/2016  . Elevated blood pressure reading 09/29/2011  . Chronic back pain 09/29/2011  . Leg edema 09/29/2011  . Obesity 09/29/2011    Sharmon Leyden, MPT 2017-02-05, 8:46 AM  [facility] [address] [place], [place], [address] Phone: [phone]   Fax:  [phone]  Name: [name] [name] MRN: [id number] Date of Birth: 1950/05/03

## 2017-01-20 NOTE — Therapy (Deleted)
[facility] [address] [place], [place], [address] Phone: [phone]   Fax:  [phone]  Physical Therapy Evaluation  Patient Details  Name: [name] [name] MRN: [id number] Date of Birth: February 01, 1951 Referring Provider: Annell Greening, MD  Encounter Date: 01/18/2017    Past Medical History:  Diagnosis Date  . Allergy   . Anemia   . Arthritis   . Asthma   . Back pain     Past Surgical History:  Procedure Laterality Date  . ABDOMINAL HYSTERECTOMY  1989   fibroid-no longer needs pap  . BACK SURGERY  1987, 1985   DDD  . CARPAL TUNNEL RELEASE Left 05/28/2016   Procedure: CARPAL TUNNEL RELEASE;  Surgeon: Cindee Salt, MD;  Location: [facility];  Service: Orthopedics;  Laterality: Left;  . TUBAL LIGATION      There were no vitals filed for this visit.               Objective measurements completed on examination: See above findings.                       PT Long Term Goals - 2017/01/24 6962      PT LONG TERM GOAL #4   Title Pt will improve bilateral sidebending in order to improve fuction.                 Plan - 01/24/17 0815    Clinical Impression Statement Pt arriving to therapy as a low complexity evaluation with low back pain which radiates down her L LE. Pt reporting intermittent pain which has been going on since 1985 folowing her back surgery after an injury at work. Pt presenting with LE weakness and limited ROM. Pt was issued a HEP and E-stim applied to lumbar paraspinals. Skilled PT needed to progres pt toward improved quality of life and functional mobility with the below interventions.    Clinical Presentation Evolving   Clinical Presentation due to: radiating pain down left LE   Clinical Decision Making Low   Rehab Potential Good   PT Frequency 2x / week   PT Duration 6 weeks   PT Treatment/Interventions ADLs/Self Care Home Management;Dry  needling;Taping;Cryotherapy;Electrical Stimulation;Iontophoresis 4mg /ml Dexamethasone;Moist Heat;Ultrasound;Traction;Balance training;Therapeutic exercise;Therapeutic activities;Functional mobility training;Stair training;Gait training;Patient/family education;Manual techniques;Passive range of motion   PT Next Visit Plan core strengthening, hamstring stretching, lumbar stretches, modalities to tolerate, assess need for lumbar traction   PT Home Exercise Plan hamstring stretches, piriformis stretch, bridges   Consulted and Agree with Plan of Care Patient      Patient will benefit from skilled therapeutic intervention in order to improve the following deficits and impairments:  Abnormal gait, Pain, Decreased mobility, Decreased balance, Decreased range of motion, Impaired flexibility, Improper body mechanics, Difficulty walking, Decreased strength  Visit Diagnosis: Chronic midline low back pain with left-sided sciatica  Difficulty in walking, not elsewhere classified  Muscle weakness (generalized)      G-Codes - 01-24-17 0825    Functional Assessment Tool Used (Outpatient Only) Foto ( 58% limitiation on 01/18/17), clinical assessment   Functional Limitation Mobility: Walking and moving around   Mobility: Walking and Moving Around Current Status [phone]) At least 40 percent but less than 60 percent impaired, limited or restricted   Mobility: Walking and Moving Around Goal Status [phone]) At least 40 percent but less than 60 percent impaired, limited or restricted       Problem List Patient Active Problem List   Diagnosis Date  Noted  . Essential hypertension, benign 02/20/2016  . Elevated blood pressure reading 09/29/2011  . Chronic back pain 09/29/2011  . Leg edema 09/29/2011  . Obesity 09/29/2011    Sharmon LeydenJennifer R Sean Macwilliams, MPT 01/20/2017, 8:36 AM  [facility] [address] [place], [place], [address] Phone: [phone]   Fax:   [phone]  Name: [name] [name] MRN: [id number] Date of Birth: 01/09/1951

## 2017-01-21 ENCOUNTER — Ambulatory Visit: Payer: Medicare Other | Admitting: Physical Therapy

## 2017-01-24 DIAGNOSIS — D649 Anemia, unspecified: Secondary | ICD-10-CM | POA: Insufficient documentation

## 2017-02-04 ENCOUNTER — Ambulatory Visit (INDEPENDENT_AMBULATORY_CARE_PROVIDER_SITE_OTHER): Payer: Medicare Other | Admitting: Orthopaedic Surgery

## 2017-02-08 ENCOUNTER — Ambulatory Visit: Payer: Medicare Other | Attending: Orthopaedic Surgery | Admitting: Physical Therapy

## 2017-02-08 ENCOUNTER — Encounter: Payer: Self-pay | Admitting: Physical Therapy

## 2017-02-08 DIAGNOSIS — G8929 Other chronic pain: Secondary | ICD-10-CM | POA: Diagnosis not present

## 2017-02-08 DIAGNOSIS — R262 Difficulty in walking, not elsewhere classified: Secondary | ICD-10-CM | POA: Diagnosis not present

## 2017-02-08 DIAGNOSIS — M5442 Lumbago with sciatica, left side: Secondary | ICD-10-CM | POA: Diagnosis not present

## 2017-02-08 DIAGNOSIS — M6281 Muscle weakness (generalized): Secondary | ICD-10-CM | POA: Diagnosis not present

## 2017-02-08 NOTE — Therapy (Signed)
[facility] [address] [place], [place], [address] Phone: [phone]   Fax:  [phone]  Physical Therapy Treatment  Patient Details  Name: [name] [name] MRN: [id number] Date of Birth: [birth year] Referring Provider: Annell Greening, MD   Encounter Date: 02/08/2017  PT End of Session - 02/08/17 0819    Visit Number  2    Number of Visits  16    Date for PT Re-Evaluation  03/20/17    Authorization Type  Medicare: G-codes needed every 10th visit, KX modifier at 15th visit    PT Start Time  0817    PT Stop Time  0908    PT Time Calculation (min)  51 min    Activity Tolerance  Patient tolerated treatment well    Behavior During Therapy  [facility] for tasks assessed/performed       Past Medical History:  Diagnosis Date  . Allergy   . Anemia   . Arthritis   . Asthma   . Back pain     Past Surgical History:  Procedure Laterality Date  . ABDOMINAL HYSTERECTOMY  1989   fibroid-no longer needs pap  . BACK SURGERY  1987, 1985   DDD  . TUBAL LIGATION      There were no vitals filed for this visit.  Subjective Assessment - 02/08/17 0818    Subjective  Reports that pain still goes down to L ankle but reports that her back is better.    Pertinent History  back surgery - laminectomy: 1985 and 1987    Limitations  House hold activities;Sitting    How long can you sit comfortably?  30 minutes    How long can you stand comfortably?  20 minutes    How long can you walk comfortably?  20 minutes    Diagnostic tests  X-ray September 2018    Patient Stated Goals  Stop hurting    Currently in Pain?  Yes    Pain Score  6     Pain Location  Back    Pain Orientation  Lower    Pain Descriptors / Indicators  Sore    Pain Type  Acute pain    Pain Radiating Towards  L ankle region         [facility] PT Assessment - 02/08/17 0001      Assessment   Medical Diagnosis  low back pain with left sided sciatica    Hand Dominance  Right    Next MD Visit   TBD    Prior Therapy  for back several years ago      Precautions   Precautions  None      Restrictions   Weight Bearing Restrictions  No                  [facility] Adult PT Treatment/Exercise - 02/08/17 0001      Exercises   Exercises  Lumbar      Lumbar Exercises: Stretches   Passive Hamstring Stretch  3 reps;30 seconds    Single Knee to Chest Stretch  3 reps;30 seconds    Lower Trunk Rotation  5 reps;20 seconds      Lumbar Exercises: Aerobic   Stationary Bike  NuStep L4 x17 min      Lumbar Exercises: Supine   Ab Set  15 reps;5 seconds    Bent Knee Raise  20 reps;2 seconds    Bridge  15 reps;2 seconds      Modalities  Modalities  Electrical Stimulation;Moist Heat      Moist Heat Therapy   Number Minutes Moist Heat  15 Minutes    Moist Heat Location  Lumbar Spine      Electrical Stimulation   Electrical Stimulation Location  B lumbar paraspinals    Electrical Stimulation Action  Pre-Mod    Electrical Stimulation Parameters  80-150 hz x15 min    Electrical Stimulation Goals  Pain                  PT Long Term Goals - 02/08/17 0820      PT LONG TERM GOAL #1   Title  Pt will be independent in her HEP    Time  4    Period  Weeks    Status  New      PT LONG TERM GOAL #2   Title  Pt will be able to amb >30 minutes with no pain reported.     Time  8    Period  Weeks    Status  New      PT LONG TERM GOAL #3   Title  pt will improve Left LE strength to 5/5 in order to improve functional mobility and gait.     Time  8    Period  Weeks    Status  New      PT LONG TERM GOAL #4   Title  Pt will improve bilateral sidebending in order to improve fuction.     Time  8    Period  Weeks    Status  New      PT LONG TERM GOAL #5   Title  Pt will be to improve trunk extension >/15 degrees in order to improve functional mobility.     Time  8    Period  Weeks    Status  New            Plan - 02/08/17 0911    Clinical Impression Statement   Patient presented in clinic with 6/10 back pain with reports of LLE radiation. Patient guided through lumbar stretching as well as HS stretches to improve flexibilty and mobility with no complaints from patient. Patient instructed regarding proper core activation technique. No increased pain reported by patient with any supine exercises today. Patient educated regarding option for lumbar traction today during treatment. Normal modalities response noted following removal of the modalities. Patient encouraged to continue HEP at home at this time.    Rehab Potential  Good    PT Frequency  2x / week    PT Duration  8 weeks    PT Treatment/Interventions  ADLs/Self Care Home Management;Dry needling;Taping;Cryotherapy;Electrical Stimulation;Iontophoresis 4mg /ml Dexamethasone;Moist Heat;Ultrasound;Traction;Balance training;Therapeutic exercise;Therapeutic activities;Functional mobility training;Stair training;Gait training;Patient/family education;Manual techniques;Passive range of motion    PT Next Visit Plan  core strengthening, hamstring stretching, lumbar stretches, modalities to tolerate, assess need for lumbar traction    PT Home Exercise Plan  hamstring stretches, piriformis stretch, bridges    Consulted and Agree with Plan of Care  Patient       Patient will benefit from skilled therapeutic intervention in order to improve the following deficits and impairments:  Abnormal gait, Pain, Decreased mobility, Decreased balance, Decreased range of motion, Impaired flexibility, Improper body mechanics, Difficulty walking, Decreased strength  Visit Diagnosis: Chronic midline low back pain with left-sided sciatica  Difficulty in walking, not elsewhere classified  Muscle weakness (generalized)     Problem List Patient Active Problem List  Diagnosis Date Noted  . Essential hypertension, benign 02/20/2016  . Elevated blood pressure reading 09/29/2011  . Chronic back pain 09/29/2011  . Leg edema  09/29/2011  . Obesity 09/29/2011    Evelene CroonKelsey M Parsons, PTA 02/08/2017, 9:24 AM  [facility] [address] [place], [place], [address] Phone: [phone]   Fax:  [phone]  Name: [name] [name] MRN: [id number] Date of Birth: 09/27/1950

## 2017-02-10 ENCOUNTER — Ambulatory Visit: Payer: Medicare Other | Admitting: Physical Therapy

## 2017-02-10 ENCOUNTER — Encounter: Payer: Self-pay | Admitting: Physical Therapy

## 2017-02-10 DIAGNOSIS — M6281 Muscle weakness (generalized): Secondary | ICD-10-CM

## 2017-02-10 DIAGNOSIS — R262 Difficulty in walking, not elsewhere classified: Secondary | ICD-10-CM | POA: Diagnosis not present

## 2017-02-10 DIAGNOSIS — M5442 Lumbago with sciatica, left side: Principal | ICD-10-CM

## 2017-02-10 DIAGNOSIS — G8929 Other chronic pain: Secondary | ICD-10-CM

## 2017-02-10 NOTE — Therapy (Signed)
[facility] [address] [place], [place], [address] Phone: [phone]   Fax:  [phone]  Physical Therapy Treatment  Patient Details  Name: [name] NineVirginia A Texidor MRN: [id number] Date of Birth: 01/07/1951 Referring Provider: Annell GreeningMark Yates, MD   Encounter Date: 02/10/2017  PT End of Session - 02/10/17 0733    Visit Number  3    Number of Visits  16    Date for PT Re-Evaluation  03/20/17    Authorization Type  Medicare: G-codes needed every 10th visit, KX modifier at 15th visit    PT Start Time  0732    PT Stop Time  0817    PT Time Calculation (min)  45 min    Activity Tolerance  Patient tolerated treatment well    Behavior During Therapy  [facility] for tasks assessed/performed       Past Medical History:  Diagnosis Date  . Allergy   . Anemia   . Arthritis   . Asthma   . Back pain     Past Surgical History:  Procedure Laterality Date  . ABDOMINAL HYSTERECTOMY  1989   fibroid-no longer needs pap  . BACK SURGERY  1987, 1985   DDD  . TUBAL LIGATION      There were no vitals filed for this visit.  Subjective Assessment - 02/10/17 0732    Subjective  Reports that her back is feeling better. Reports soreness upon waking in the mornings.    Pertinent History  back surgery - laminectomy: 1985 and 1987    Limitations  House hold activities;Sitting    How long can you sit comfortably?  30 minutes    How long can you stand comfortably?  20 minutes    How long can you walk comfortably?  20 minutes    Diagnostic tests  X-ray September 2018    Patient Stated Goals  Stop hurting    Currently in Pain?  No/denies         [facility] - 02/10/17 0001      Assessment   Medical Diagnosis  low back pain with left sided sciatica    Hand Dominance  Right    Next MD Visit  TBD    Prior Therapy  for back several years ago      Precautions   Precautions  None      Restrictions   Weight Bearing Restrictions  No                   [facility] Adult PT Treatment/Exercise - 02/10/17 0001      Lumbar Exercises: Stretches   Single Knee to Chest Stretch  3 reps;30 seconds      Lumbar Exercises: Aerobic   Stationary Bike  NuStep L4 x16 min      Lumbar Exercises: Standing   Shoulder Extension  Strengthening;Both;20 reps Pink XTS   Pink XTS     Lumbar Exercises: Supine   Bent Knee Raise  20 reps    Bridge  15 reps;3 seconds    Straight Leg Raise  10 reps      Modalities   Modalities  Traction      Traction   Type of Traction  Lumbar    Min (lbs)  5    Max (lbs)  86    Hold Time  99    Rest Time  5    Time  15  PT Long Term Goals - 02/08/17 0820      PT LONG TERM GOAL #1   Title  Pt will be independent in her HEP    Time  4    Period  Weeks    Status  New      PT LONG TERM GOAL #2   Title  Pt will be able to amb >30 minutes with no pain reported.     Time  8    Period  Weeks    Status  New      PT LONG TERM GOAL #3   Title  pt will improve Left LE strength to 5/5 in order to improve functional mobility and gait.     Time  8    Period  Weeks    Status  New      PT LONG TERM GOAL #4   Title  Pt will improve bilateral sidebending in order to improve fuction.     Time  8    Period  Weeks    Status  New      PT LONG TERM GOAL #5   Title  Pt will be to improve trunk extension >/15 degrees in order to improve functional mobility.     Time  8    Period  Weeks    Status  New            Plan - 02/10/17 0810    Clinical Impression Statement  Patient presented in clinic with no current LBP only reports of soreness upon waking in mornings. Patient able to complete supine and standing exercises without reports of increased pain and VCs for core activation. Reports of ongoing L knee pain and reports of past L knee injections during [facility]. Traction conducted with 86# max weight.    Rehab Potential  Good    PT Frequency  2x / week    PT Duration  8  weeks    PT Treatment/Interventions  ADLs/Self Care Home Management;Dry needling;Taping;Cryotherapy;Electrical Stimulation;Iontophoresis 4mg /ml Dexamethasone;Moist Heat;Ultrasound;Traction;Balance training;Therapeutic exercise;Therapeutic activities;Functional mobility training;Stair training;Gait training;Patient/family education;Manual techniques;Passive range of motion    PT Next Visit Plan  core strengthening, hamstring stretching, lumbar stretches, modalities to tolerate, assess need for lumbar traction    PT Home Exercise Plan  hamstring stretches, piriformis stretch, bridges    Consulted and Agree with Plan of Care  Patient       Patient will benefit from skilled therapeutic intervention in order to improve the following deficits and impairments:  Abnormal gait, Pain, Decreased mobility, Decreased balance, Decreased range of motion, Impaired flexibility, Improper body mechanics, Difficulty walking, Decreased strength  Visit Diagnosis: Chronic midline low back pain with left-sided sciatica  Difficulty in walking, not elsewhere classified  Muscle weakness (generalized)     Problem List Patient Active Problem List   Diagnosis Date Noted  . Essential hypertension, benign 02/20/2016  . Elevated blood pressure reading 09/29/2011  . Chronic back pain 09/29/2011  . Leg edema 09/29/2011  . Obesity 09/29/2011    Evelene CroonKelsey M Parsons, PTA 02/10/2017, 8:50 AM  [facility] [address] [place], [place], [address] Phone: [phone]   Fax:  [phone]  Name: [name] NineVirginia A Rodriques MRN: [id number] Date of Birth: 04/24/1950

## 2017-02-16 ENCOUNTER — Ambulatory Visit: Payer: Medicare Other | Admitting: Physical Therapy

## 2017-02-18 ENCOUNTER — Encounter: Payer: Medicare Other | Admitting: Physical Therapy

## 2017-03-03 DIAGNOSIS — I10 Essential (primary) hypertension: Secondary | ICD-10-CM | POA: Diagnosis not present

## 2017-03-03 DIAGNOSIS — R946 Abnormal results of thyroid function studies: Secondary | ICD-10-CM | POA: Diagnosis not present

## 2017-03-03 DIAGNOSIS — K640 First degree hemorrhoids: Secondary | ICD-10-CM | POA: Diagnosis not present

## 2017-03-03 DIAGNOSIS — Z6839 Body mass index (BMI) 39.0-39.9, adult: Secondary | ICD-10-CM | POA: Diagnosis not present

## 2017-03-03 DIAGNOSIS — D6489 Other specified anemias: Secondary | ICD-10-CM | POA: Diagnosis not present

## 2017-03-05 DIAGNOSIS — E039 Hypothyroidism, unspecified: Secondary | ICD-10-CM | POA: Insufficient documentation

## 2017-04-13 ENCOUNTER — Encounter (HOSPITAL_COMMUNITY): Payer: Self-pay | Admitting: *Deleted

## 2017-04-13 ENCOUNTER — Emergency Department (HOSPITAL_COMMUNITY): Payer: Medicare Other

## 2017-04-13 ENCOUNTER — Emergency Department (HOSPITAL_COMMUNITY)
Admission: EM | Admit: 2017-04-13 | Discharge: 2017-04-13 | Disposition: A | Discharge status: Home / Self Care | Payer: Medicare Other | Attending: Emergency Medicine | Admitting: Emergency Medicine

## 2017-04-13 DIAGNOSIS — R1031 Right lower quadrant pain: Secondary | ICD-10-CM | POA: Insufficient documentation

## 2017-04-13 DIAGNOSIS — M5441 Lumbago with sciatica, right side: Secondary | ICD-10-CM | POA: Diagnosis not present

## 2017-04-13 DIAGNOSIS — I10 Essential (primary) hypertension: Secondary | ICD-10-CM | POA: Diagnosis not present

## 2017-04-13 DIAGNOSIS — Z79899 Other long term (current) drug therapy: Secondary | ICD-10-CM | POA: Diagnosis not present

## 2017-04-13 DIAGNOSIS — M79661 Pain in right lower leg: Secondary | ICD-10-CM | POA: Diagnosis not present

## 2017-04-13 DIAGNOSIS — J45909 Unspecified asthma, uncomplicated: Secondary | ICD-10-CM | POA: Diagnosis not present

## 2017-04-13 DIAGNOSIS — M545 Low back pain: Secondary | ICD-10-CM | POA: Diagnosis not present

## 2017-04-13 LAB — URINALYSIS, ROUTINE W REFLEX MICROSCOPIC
Bilirubin Urine: NEGATIVE
Glucose, UA: NEGATIVE mg/dL
Hgb urine dipstick: NEGATIVE
Ketones, ur: NEGATIVE mg/dL
Leukocytes, UA: NEGATIVE
Nitrite: NEGATIVE
Protein, ur: NEGATIVE mg/dL
Specific Gravity, Urine: 1.016 (ref 1.005–1.030)
pH: 5 (ref 5.0–8.0)

## 2017-04-13 MED ORDER — CYCLOBENZAPRINE HCL 10 MG PO TABS: 10.0000 mg | ORAL_TABLET | Freq: Three times a day (TID) | ORAL | 0 refills | Status: DC | PRN

## 2017-04-13 MED ORDER — OXYCODONE-ACETAMINOPHEN 5-325 MG PO TABS: 1.0000 | ORAL_TABLET | ORAL | 0 refills | Status: DC | PRN

## 2017-04-13 MED ORDER — PREDNISONE 10 MG PO TABS: ORAL_TABLET | ORAL | 0 refills | Status: DC

## 2017-04-13 MED ORDER — HYDROMORPHONE HCL 1 MG/ML IJ SOLN
1.0000 mg | Freq: Once | INTRAMUSCULAR | Status: AC
  Administered 2017-04-13: 1 mg via INTRAMUSCULAR
  Filled 2017-04-13: qty 1

## 2017-04-13 MED ORDER — ONDANSETRON 8 MG PO TBDP
8.0000 mg | ORAL_TABLET | Freq: Once | ORAL | Status: AC
  Administered 2017-04-13: 8 mg via ORAL
  Filled 2017-04-13: qty 1

## 2017-04-13 NOTE — ED Notes (Signed)
Patient given discharge instruction, verbalized understand. IV removed, band aid applied. Patient ambulatory out of the department.  

## 2017-04-13 NOTE — Discharge Instructions (Signed)
Alternate ice and heat to your lower back.  Avoid twisting or bending movements.  Call your orthopedic surgeon to arrange a follow-up appointment.  Return to the ER for any worsening symptoms

## 2017-04-13 NOTE — ED Provider Notes (Signed)
[facility]ERGENCY DEPARTMENT Provider Note   CSN: [id number] Arrival date & time: 04/13/17  1137     History   Chief Complaint Chief Complaint  Patient presents with  . Back Pain    HPI [place] E[place] is a 67 y.o. female.  HPI   [place] A Daleen SquibbWall is a 67 y.o. female who presents to the Emergency Department complaining of low back right leg and right groin pain since Saturday.  She states that she stepped over into her shower Saturday evening and felt a sharp pain to her right lower back that radiated into her right lower leg.  Pain is been constant she describes as sharp and stabbing.  Pain is worse with sitting and with lying down, improves with standing.  She reports history of chronic low back pain and pain feels similar to previous.  She denies abdominal pain, fever, urine or bowel incontinence or retention, and numbness or weakness of the lower extremities.   Past Medical History:  Diagnosis Date  . Allergy   . Anemia   . Arthritis   . Asthma   . Back pain     Patient Active Problem List   Diagnosis Date Noted  . Essential hypertension, benign 02/20/2016  . Elevated blood pressure reading 09/29/2011  . Chronic back pain 09/29/2011  . Leg edema 09/29/2011  . Obesity 09/29/2011    Past Surgical History:  Procedure Laterality Date  . ABDOMINAL HYSTERECTOMY  1989   fibroid-no longer needs pap  . BACK SURGERY  1987, 1985   DDD  . CARPAL TUNNEL RELEASE Left 05/28/2016   Procedure: CARPAL TUNNEL RELEASE;  Surgeon: Cindee SaltGary Kuzma, MD;  Location: [facility];  Service: Orthopedics;  Laterality: Left;  . TUBAL LIGATION      OB History    No data available       Home Medications    Prior to Admission medications   Medication Sig Start Date End Date Taking? Authorizing Provider  amLODipine (NORVASC) 5 MG tablet Take 1 tablet (5 mg total) by mouth daily. 03/04/16   Dettinger, Elige RadonJoshua A, MD  calcium carbonate (OSCAL) 1500 (600 Ca) MG TABS tablet Take  by mouth 2 (two) times daily with a meal.    [provider]  HYDROcodone-acetaminophen (NORCO) 5-325 MG tablet Take 1 tablet by mouth every 6 (six) hours as needed for moderate pain. 05/28/16   Cindee SaltKuzma, Gary, MD  ibuprofen (ADVIL,MOTRIN) 600 MG tablet Take 1 tablet by mouth as needed. 11/22/15   [provider]  magnesium oxide (MAG-OX) 400 MG tablet Take 400 mg by mouth daily.    [provider]  meloxicam (MOBIC) 7.5 MG tablet Take 7.5 mg by mouth daily.    [provider]  traMADol (ULTRAM) 50 MG tablet Take 1 tablet (50 mg total) by mouth every 12 (twelve) hours as needed. 12/22/16   Eldred MangesYates, Mark C, MD    Family History Family History  Problem Relation Age of Onset  . Asthma Mother   . Cancer Mother        AML  . Leukemia Mother   . Heart disease Father   . Hypertension Father   . Kidney disease Father   . Breast cancer Maternal Aunt     Social History Social History   Tobacco Use  . Smoking status: Never Smoker  . Smokeless tobacco: Never Used  Substance Use Topics  . Alcohol use: No  . Drug use: No     Allergies  Flagyl [metronidazole]   Review of Systems Review of Systems  Constitutional: Negative for fever.  Respiratory: Negative for shortness of breath.   Gastrointestinal: Negative for abdominal pain, constipation and vomiting.  Genitourinary: Negative for decreased urine volume, difficulty urinating, dysuria, flank pain and hematuria.  Musculoskeletal: Positive for back pain. Negative for joint swelling.  Skin: Negative for rash.  Neurological: Negative for weakness and numbness.  All other systems reviewed and are negative.    Physical Exam Updated Vital Signs BP 114/76 (BP Location: Right Arm)   Pulse 97   Temp 98.7 F (37.1 C) (Oral)   Resp 18   Ht 5' 8.5" (1.74 m)   Wt 98 kg (216 lb)   SpO2 100%   BMI 32.36 kg/m   Physical Exam  Constitutional: She is oriented to person, place, and time. She appears  well-developed and well-nourished. No distress.  HENT:  Head: Normocephalic and atraumatic.  Neck: Normal range of motion. Neck supple.  Cardiovascular: Normal rate, regular rhythm and intact distal pulses.  DP pulses are strong and palpable bilaterally  Pulmonary/Chest: Effort normal and breath sounds normal. No respiratory distress.  Abdominal: Soft. She exhibits no distension. There is no tenderness. There is no guarding.  Musculoskeletal: She exhibits tenderness. She exhibits no edema.       Lumbar back: She exhibits tenderness and pain. She exhibits normal range of motion, no swelling, no deformity, no laceration and normal pulse.  ttp of the lower lumbar spine and right lumbar paraspinal muscles and SI joint space. Pt has 5/5 strength against resistance of bilateral lower extremities.     Neurological: She is alert and oriented to person, place, and time. She has normal strength. No sensory deficit. She exhibits normal muscle tone. Coordination and gait normal.  Reflex Scores:      Patellar reflexes are 2+ on the right side and 2+ on the left side.      Achilles reflexes are 2+ on the right side and 2+ on the left side. Skin: Skin is warm and dry. Capillary refill takes less than 2 seconds. No rash noted.  Psychiatric: She has a normal mood and affect.  Nursing note and vitals reviewed.    ED Treatments / Results  Labs (all labs ordered are listed, but only abnormal results are displayed) Labs Reviewed  URINALYSIS, ROUTINE W REFLEX MICROSCOPIC - Abnormal; Notable for the following components:      Result Value   APPearance HAZY (*)    All other components within normal limits    EKG  EKG Interpretation None       Radiology Dg Lumbar Spine Complete  Result Date: 04/13/2017 CLINICAL DATA:  Low back pain radiating into the right leg EXAM: LUMBAR SPINE - COMPLETE 4+ VIEW COMPARISON:  12/22/2016 FINDINGS: Five lumbar type vertebral bodies are well visualized. Vertebral body  height is well maintained. Anterolisthesis is noted of L4 on L5. This has increased slightly in the interval from the prior exam measuring approximately 6 mm. This is likely related to degenerative facet changes as no definitive pars defects are seen. No acute abnormality noted. IMPRESSION: Multilevel degenerative change with mild anterolisthesis of L4 on L5. Electronically Signed   By: Alcide Clever M.D.   On: 04/13/2017 15:02    Procedures Procedures (including critical care time)  Medications Ordered in ED Medications  HYDROmorphone (DILAUDID) injection 1 mg (1 mg Intramuscular Given 04/13/17 1316)  ondansetron (ZOFRAN-ODT) disintegrating tablet 8 mg (8 mg Oral Given 04/13/17 1316)  HYDROmorphone (  DILAUDID) injection 1 mg (1 mg Intramuscular Given 04/13/17 1415)     Initial Impression / Assessment and Plan / ED Course  I have reviewed the triage vital signs and the nursing notes.  Pertinent labs & imaging results that were available during my care of the patient were reviewed by me and considered in my medical decision making (see chart for details).     On recheck, patient is feeling better after IM.  She is ambulated to the restroom with a steady gait.  No focal neuro deficits on exam.  History of chronic low back pain with likely acute exacerbation.  No saddle anesthesias or concerning symptoms for emergent neurological process.    I have discussed x-ray findings with her she agrees to follow-up with her orthopedic surgeon, Dr. Ophelia Charter.  She appears safe for discharge, will prescribe anti-inflammatory, steroid, and pain medication.   Patient was reviewed on the narcotics database, no prescriptions filed since September 2018.   Final Clinical Impressions(s) / ED Diagnoses   Final diagnoses:  Acute right-sided low back pain with right-sided sciatica    ED Discharge Orders    None       Pauline Aus, PA-C 04/13/17 1549    Raeford Razor, MD 04/13/17 1550

## 2017-04-13 NOTE — ED Triage Notes (Signed)
Pt with lower back pain that radiates into right groin and right leg.

## 2017-05-07 ENCOUNTER — Ambulatory Visit (INDEPENDENT_AMBULATORY_CARE_PROVIDER_SITE_OTHER): Payer: Medicare Other | Admitting: Orthopaedic Surgery

## 2017-05-26 ENCOUNTER — Ambulatory Visit (INDEPENDENT_AMBULATORY_CARE_PROVIDER_SITE_OTHER): Payer: Medicare Other | Admitting: Orthopaedic Surgery

## 2017-06-03 DIAGNOSIS — Z13820 Encounter for screening for osteoporosis: Secondary | ICD-10-CM | POA: Diagnosis not present

## 2017-06-03 DIAGNOSIS — Z0001 Encounter for general adult medical examination with abnormal findings: Secondary | ICD-10-CM | POA: Diagnosis not present

## 2017-06-03 DIAGNOSIS — E039 Hypothyroidism, unspecified: Secondary | ICD-10-CM | POA: Diagnosis not present

## 2017-06-03 DIAGNOSIS — B372 Candidiasis of skin and nail: Secondary | ICD-10-CM | POA: Diagnosis not present

## 2017-06-03 DIAGNOSIS — Z6839 Body mass index (BMI) 39.0-39.9, adult: Secondary | ICD-10-CM | POA: Diagnosis not present

## 2017-06-03 DIAGNOSIS — R7303 Prediabetes: Secondary | ICD-10-CM | POA: Diagnosis not present

## 2017-06-03 DIAGNOSIS — Z7189 Other specified counseling: Secondary | ICD-10-CM | POA: Diagnosis not present

## 2017-06-03 DIAGNOSIS — Z23 Encounter for immunization: Secondary | ICD-10-CM | POA: Diagnosis not present

## 2017-06-03 DIAGNOSIS — Z1211 Encounter for screening for malignant neoplasm of colon: Secondary | ICD-10-CM | POA: Diagnosis not present

## 2017-06-03 DIAGNOSIS — Z136 Encounter for screening for cardiovascular disorders: Secondary | ICD-10-CM | POA: Diagnosis not present

## 2017-06-03 DIAGNOSIS — Z79899 Other long term (current) drug therapy: Secondary | ICD-10-CM | POA: Diagnosis not present

## 2017-06-03 DIAGNOSIS — Z1231 Encounter for screening mammogram for malignant neoplasm of breast: Secondary | ICD-10-CM | POA: Diagnosis not present

## 2017-06-04 DIAGNOSIS — E782 Mixed hyperlipidemia: Secondary | ICD-10-CM | POA: Insufficient documentation

## 2017-06-09 ENCOUNTER — Encounter (INDEPENDENT_AMBULATORY_CARE_PROVIDER_SITE_OTHER): Payer: Self-pay | Admitting: Orthopaedic Surgery

## 2017-06-09 ENCOUNTER — Ambulatory Visit (INDEPENDENT_AMBULATORY_CARE_PROVIDER_SITE_OTHER): Payer: Medicare Other | Admitting: Orthopaedic Surgery

## 2017-06-09 VITALS — BP 149/94 | HR 79 | Ht 68.5 in | Wt 208.0 lb

## 2017-06-09 DIAGNOSIS — M5126 Other intervertebral disc displacement, lumbar region: Secondary | ICD-10-CM

## 2017-06-09 NOTE — Progress Notes (Signed)
Office Visit Note   Patient: [name] [name]           Date of Birth: 1950-08-31           MRN: [id number] Visit Date: 06/09/2017              Requested by: Harvest Forest, MD [address] [name], [place] [address] PCP: Harvest Forest, MD   Assessment & Plan: Visit Diagnoses:  1. Protrusion of lumbar intervertebral disc     Plan: We discussed that she likely has some disc bulge with the anterolisthesis at L4-5 and facet arthropathy she probably had some nerve root irritation with associated spasms.  She is walking better.  If she has recurrent symptoms more frequently will have to consider diagnostic MRI scan.  She is neurologically intact today and is moving better.  Follow-Up Instructions: No Follow-up on file.   Orders:  No orders of the defined types were placed in this encounter.  No orders of the defined types were placed in this encounter.     Procedures: No procedures performed   Clinical Data: No additional findings.   Subjective: Chief Complaint  Patient presents with  . Lower Back - Pain    HPI 67 year old female returns she had an episode in January of severe back pain was seen in the emergency room at [facility] and treated.  She states she went home rested and her back is improved.  She is walking better now she states something feels like it shifts in her back she has pain in her buttocks.  Patient had 2 previous back surgeries at L5-S1 on the right 1 by Dr. Vear Clock and 1 by Dr. Senaida Lange in the 1980s.  Previous MRI showed some anterolisthesis at L4-5 which was also demonstrated on plain radiographs in the emergency room on 04/13/2017.  Review of Systems 14 point review of systems updated unchanged from 12/22/2016 other than as mentioned in HPI.   Objective: Vital Signs: BP (!) 149/94   Pulse 79   Ht 5' 8.5" (1.74 m)   Wt 208 lb (94.3 kg)   BMI 31.17 kg/m   Physical Exam  Constitutional: She is oriented to person,  place, and time. She appears well-developed.  HENT:  Head: Normocephalic.  Right Ear: External ear normal.  Left Ear: External ear normal.  Eyes: Pupils are equal, round, and reactive to light.  Neck: No tracheal deviation present. No thyromegaly present.  Cardiovascular: Normal rate.  Pulmonary/Chest: Effort normal.  Abdominal: Soft.  Neurological: She is alert and oriented to person, place, and time.  Skin: Skin is warm and dry.  Psychiatric: She has a normal mood and affect. Her behavior is normal.  A1c was 6.4 consistent with borderline.  Ortho Exam anterior tib EHL is intact.  She has limitation of left hip internal rotation at 0 degrees with some groin pain but this is not the primary pain she has had.  Negative straight leg raising 90 degrees.  Reflexes intact anterior tib gastrocsoleus is strong.  Ambulatory with normal heel toe gait.  Bilateral trochanteric tenderness.  Specialty Comments:  No specialty comments available.  Imaging: No results found.   PMFS History: Patient Active Problem List   Diagnosis Date Noted  . Essential hypertension, benign 02/20/2016  . Elevated blood pressure reading 09/29/2011  . Chronic back pain 09/29/2011  . Leg edema 09/29/2011  . Obesity 09/29/2011   Past Medical History:  Diagnosis Date  . Allergy   .  Anemia   . Arthritis   . Asthma   . Back pain     Family History  Problem Relation Age of Onset  . Asthma Mother   . Cancer Mother        AML  . Leukemia Mother   . Heart disease Father   . Hypertension Father   . Kidney disease Father   . Breast cancer Maternal Aunt     Past Surgical History:  Procedure Laterality Date  . ABDOMINAL HYSTERECTOMY  1989   fibroid-no longer needs pap  . BACK SURGERY  1987, 1985   DDD  . CARPAL TUNNEL RELEASE Left 05/28/2016   Procedure: CARPAL TUNNEL RELEASE;  Surgeon: Cindee SaltGary Kuzma, MD;  Location: [facility];  Service: Orthopedics;  Laterality: Left;  . TUBAL LIGATION      Social History   Occupational History  . Not on file  Tobacco Use  . Smoking status: Never Smoker  . Smokeless tobacco: Never Used  Substance and Sexual Activity  . Alcohol use: No  . Drug use: No  . Sexual activity: Not Currently

## 2017-06-14 DIAGNOSIS — H43391 Other vitreous opacities, right eye: Secondary | ICD-10-CM | POA: Diagnosis not present

## 2017-06-14 DIAGNOSIS — H25013 Cortical age-related cataract, bilateral: Secondary | ICD-10-CM | POA: Diagnosis not present

## 2017-06-14 DIAGNOSIS — H2513 Age-related nuclear cataract, bilateral: Secondary | ICD-10-CM | POA: Diagnosis not present

## 2017-06-14 DIAGNOSIS — H40013 Open angle with borderline findings, low risk, bilateral: Secondary | ICD-10-CM | POA: Diagnosis not present

## 2017-08-03 ENCOUNTER — Ambulatory Visit (INDEPENDENT_AMBULATORY_CARE_PROVIDER_SITE_OTHER): Payer: Medicare HMO | Admitting: Orthopaedic Surgery

## 2017-08-03 ENCOUNTER — Encounter (INDEPENDENT_AMBULATORY_CARE_PROVIDER_SITE_OTHER): Payer: Self-pay | Admitting: Orthopaedic Surgery

## 2017-08-03 VITALS — BP 135/82 | HR 77 | Ht 68.5 in | Wt 208.0 lb

## 2017-08-03 DIAGNOSIS — M48062 Spinal stenosis, lumbar region with neurogenic claudication: Secondary | ICD-10-CM | POA: Diagnosis not present

## 2017-08-03 DIAGNOSIS — M4807 Spinal stenosis, lumbosacral region: Secondary | ICD-10-CM | POA: Diagnosis not present

## 2017-08-03 NOTE — Addendum Note (Signed)
Addended by: Rogers Seeds on: 08/03/2017 10:22 AM   Modules accepted: Orders

## 2017-08-03 NOTE — Progress Notes (Signed)
Office Visit Note   Patient: [name] [name]           Date of Birth: 1950/06/18           MRN: [id number] Visit Date: 08/03/2017              Requested by: Harvest Forest, MD [address] [name], [place] [address] PCP: Harvest Forest, MD   Assessment & Plan: Visit Diagnoses:  1. Spinal stenosis of lumbar region with neurogenic claudication     Plan: Patient like to proceed with MRI scan lumbar spine.  She has anterolisthesis with previous scan 2011 documenting moderate spinal stenosis.  Since that time her anterolisthesis is progressed.  She does have some joint space narrowing on hip x-rays noted on previous AP lumbar spine.  She has some decreased range of motion in her hips but this is not particularly painful.  Office follow-up after lumbar MRI scan with and without contrast.  Follow-Up Instructions: No follow-ups on file.   Orders:  No orders of the defined types were placed in this encounter.  No orders of the defined types were placed in this encounter.     Procedures: No procedures performed   Clinical Data: No additional findings.   Subjective: Chief Complaint  Patient presents with  . Lower Back - Pain    HPI 67 year old female returns with ongoing problems with back pain.  She was doing somewhat better last month now states her pain is worse.  She can only stand about 10 minutes when she cooks then asked to sit down.  She states she is able to walk 1/2 mile the pain gets severe but she still is able to still walk.  At times her pain is been severe enough she is not able to ambulate in the house.  She has had prednisone pack in the past, Norco, Percocet.  She has been to the emergency room when her pain was severe and she is not able ambulate.  She is had recent carpal tunnel surgery on the left and carpal tunnel injection on the right.  Left hand is doing better.  Pain is worse radiating down her right leg and her right lateral calf.  She  has some numbness and tingling in her feet.  It is worse with standing.  No chills or fever.  Review of Systems 14 point review of systems updated unchanged from 12/22/2016 office note other than as mentioned in HPI.   Objective: Vital Signs: BP 135/82   Pulse 77   Ht 5' 8.5" (1.74 m)   Wt 208 lb (94.3 kg)   BMI 31.17 kg/m   Physical Exam  Constitutional: She is oriented to person, place, and time. She appears well-developed.  HENT:  Head: Normocephalic.  Right Ear: External ear normal.  Left Ear: External ear normal.  Eyes: Pupils are equal, round, and reactive to light.  Neck: No tracheal deviation present. No thyromegaly present.  Cardiovascular: Normal rate.  Pulmonary/Chest: Effort normal.  Abdominal: Soft.  Neurological: She is alert and oriented to person, place, and time.  Skin: Skin is warm and dry.  Psychiatric: She has a normal mood and affect. Her behavior is normal.    Ortho Exam patient has well-healed lumbar incision and L5-S1.  ( old surgery by Dr. Vear Clock and also Dr. Senaida Lange in the 80's) patient has some pain with straight leg raising on the right at 80 degrees sciatic notch tenderness in the right trochanteric bursal  tenderness.  Limitation of internal rotation both hips at 30 degrees which causes some discomfort.  This does not reproduce her normal pain.  No hip flexion contracture.  Specialty Comments:  No specialty comments available.  Imaging: No results found.   PMFS History: Patient Active Problem List   Diagnosis Date Noted  . Essential hypertension, benign 02/20/2016  . Elevated blood pressure reading 09/29/2011  . Chronic back pain 09/29/2011  . Leg edema 09/29/2011  . Obesity 09/29/2011   Past Medical History:  Diagnosis Date  . Allergy   . Anemia   . Arthritis   . Asthma   . Back pain     Family History  Problem Relation Age of Onset  . Asthma Mother   . Cancer Mother        AML  . Leukemia Mother   . Heart disease Father     . Hypertension Father   . Kidney disease Father   . Breast cancer Maternal Aunt     Past Surgical History:  Procedure Laterality Date  . ABDOMINAL HYSTERECTOMY  1989   fibroid-no longer needs pap  . BACK SURGERY  1987, 1985   DDD  . CARPAL TUNNEL RELEASE Left 05/28/2016   Procedure: CARPAL TUNNEL RELEASE;  Surgeon: Cindee Salt, MD;  Location: [facility];  Service: Orthopedics;  Laterality: Left;  . TUBAL LIGATION     Social History   Occupational History  . Not on file  Tobacco Use  . Smoking status: Never Smoker  . Smokeless tobacco: Never Used  Substance and Sexual Activity  . Alcohol use: No  . Drug use: No  . Sexual activity: Not Currently

## 2017-08-21 ENCOUNTER — Ambulatory Visit
Admission: RE | Admit: 2017-08-21 | Discharge: 2017-08-21 | Disposition: A | Discharge status: Home / Self Care | Payer: Medicare Other | Source: Ambulatory Visit | Attending: Orthopaedic Surgery | Admitting: Orthopaedic Surgery

## 2017-08-21 DIAGNOSIS — M4807 Spinal stenosis, lumbosacral region: Secondary | ICD-10-CM

## 2017-08-21 MED ORDER — GADOBENATE DIMEGLUMINE 529 MG/ML IV SOLN
20.0000 mL | Freq: Once | INTRAVENOUS | Status: AC | PRN
  Administered 2017-08-21: 20 mL via INTRAVENOUS

## 2017-08-31 ENCOUNTER — Ambulatory Visit (INDEPENDENT_AMBULATORY_CARE_PROVIDER_SITE_OTHER): Payer: Medicare HMO | Admitting: Orthopaedic Surgery

## 2017-09-17 ENCOUNTER — Telehealth (INDEPENDENT_AMBULATORY_CARE_PROVIDER_SITE_OTHER): Payer: Self-pay | Admitting: Orthopaedic Surgery

## 2017-09-17 NOTE — Telephone Encounter (Signed)
Patient came in thinking she had appt this morning at 8:45 with Ophelia CharterYates, it was actually scheduled for 6/19. Patient was frustrated since she drives so far but I told her Dr.Yates isn't here this morning. She wanted to cancel appt for Wednesday and be called with MRI results. # [phone]

## 2017-09-17 NOTE — Telephone Encounter (Signed)
Please advise 

## 2017-09-20 NOTE — Telephone Encounter (Signed)
Called several days in a row, no answer, messages left about being seen [place] if she wants on a Thursday to review MRI.

## 2017-09-21 NOTE — Telephone Encounter (Signed)
I called and left message she should call the office to [facility] where we can review the MRI and discussed options for treatment.  I have called her several times on several days and have not been able to reach her.FYI

## 2017-09-21 NOTE — Telephone Encounter (Signed)
noted 

## 2017-09-22 ENCOUNTER — Ambulatory Visit (INDEPENDENT_AMBULATORY_CARE_PROVIDER_SITE_OTHER): Payer: Medicare HMO | Admitting: Orthopaedic Surgery

## 2017-09-30 ENCOUNTER — Encounter (INDEPENDENT_AMBULATORY_CARE_PROVIDER_SITE_OTHER): Payer: Self-pay | Admitting: Orthopaedic Surgery

## 2017-09-30 ENCOUNTER — Ambulatory Visit (INDEPENDENT_AMBULATORY_CARE_PROVIDER_SITE_OTHER): Payer: Medicare HMO | Admitting: Orthopaedic Surgery

## 2017-09-30 VITALS — BP 133/88 | HR 80 | Ht 68.5 in | Wt 216.0 lb

## 2017-09-30 DIAGNOSIS — M48062 Spinal stenosis, lumbar region with neurogenic claudication: Secondary | ICD-10-CM

## 2017-09-30 NOTE — Progress Notes (Signed)
Office Visit Note   Patient: [name] [name]           Date of Birth: 01/20/1951           MRN: [id number] Visit Date: 09/30/2017              Requested by: Harvest ForestBakare, Mobolaji B, MD [address] [place], [place] [id number] PCP: Harvest ForestBakare, Mobolaji B, MD   Assessment & Plan: Visit Diagnoses:  1. Spinal stenosis of lumbar region with neurogenic claudication     Plan: MRI scan is reviewed with patient I gave her a copy of the report.  She has progression of her lumbar stenosis by serial MRI scan which is now severe at the L3-4 level.  She has stable moderate narrowing at L4-5 previous surgery on the right side with hemilaminectomy.  L2-3 level also shows stenosis.  Plan would be lumbar decompression for spinal stenosis at the L2-3 level, L3-4 level left side at L4-5 which is a redo level.  Procedure discussed with hospitalization likely to night stay use of walker postoperatively.  She has family available at home.  She would require preoperative medical clearance.  Risks of dural tear reoperation progression of disc degeneration discussed.  Questions were elicited and answered.  Her symptoms have progressed the point where she is having difficulty with activities of daily living and community ambulation and she would like to proceed.  Patient has a cruise planned in October and would like to wait until November for surgery she will call about scheduling.  Follow-Up Instructions: No follow-ups on file.   Orders:  No orders of the defined types were placed in this encounter.  No orders of the defined types were placed in this encounter.     Procedures: No procedures performed   Clinical Data: No additional findings.   Subjective: Chief Complaint  Patient presents with  . Lower Back - Pain, Follow-up    MRI review    HPI 67 year old female returns with ongoing problems with weakness in her legs with ambulation pain after standing for 10 minutes, improvement leaning over  a grocery cart.  She is been treated with prednisone pack in the past and MRI scan has been obtained and is available for review for evaluation of her spinal stenosis symptoms with neurogenic claudication.  She denies associated bowel or bladder symptoms.  She gets relief with sitting or supine position.  Review of Systems carpal tunnel release 4 months ago on the left hand by Dr. Merlyn LotKuzma doing well.  Carpal tunnel syndrome on the right hand had an injection with positive nerve conduction velocities doing better and followed by Dr. Merlyn LotKuzma.  Patient's had mild blood pressure elevation with hypertension.  History of leg edema, obesity, otherwise updated from 12/22/2016 review of systems and unchanged.  Lumbar MRI scan has been obtained for her neurogenic claudication symptoms.  Objective: Vital Signs: BP 133/88   Pulse 80   Ht 5' 8.5" (1.74 m)   Wt 216 lb (98 kg)   BMI 32.36 kg/m   Physical Exam  Constitutional: She is oriented to person, place, and time. She appears well-developed.  HENT:  Head: Normocephalic.  Right Ear: External ear normal.  Left Ear: External ear normal.  Eyes: Pupils are equal, round, and reactive to light.  Neck: No tracheal deviation present. No thyromegaly present.  Cardiovascular: Normal rate.  Pulmonary/Chest: Effort normal.  Abdominal: Soft.  Neurological: She is alert and oriented to person, place, and time.  Skin:  Skin is warm and dry.  Psychiatric: She has a normal mood and affect. Her behavior is normal.    Ortho Exam well-healed lumbar incisions from previous procedure by Dr. Vear Clock and also Dr. Senaida Lange at L4-5 and L5-S1.  Patient has limitation of internal rotation more in her left than right hip which gives her groin pain. Anterior tib gastrocsoleus is intact and take good resistive manual testing.  Mild tenderness of the trochanteric bursa.  She has some pain with straight leg raising 80 degrees on the right negative popliteal compression test.   Internal and external rotation of her hips at 30 degrees gives her groin discomfort but does not reproduce her pain that she gets with standing or walking. Specialty Comments:  No specialty comments available.  Imaging: CLINICAL DATA:  Lumbar spinal stenosis.  Back pain  Creatinine was obtained on site at [facility] at [address]  Results: Creatinine 0.6 mg/dL.  EXAM: MRI LUMBAR SPINE WITHOUT AND WITH CONTRAST  TECHNIQUE: Multiplanar and multiecho pulse sequences of the lumbar spine were obtained without and with intravenous contrast.  CONTRAST:  20mL MULTIHANCE GADOBENATE DIMEGLUMINE 529 MG/ML IV SOLN  COMPARISON:  Lumbar MRI 08/10/2009  FINDINGS: Segmentation:  Normal  Alignment: Mild retrolisthesis L1-2 and L2-3. 5 mm anterolisthesis L4-5 has progressed.  Vertebrae: Negative for fracture or mass. Fatty endplate changes at L5-S1 due to disc degeneration similar to the prior study.  Conus medullaris and cauda equina: Conus extends to the L1-2 level. Conus and cauda equina appear normal.  Paraspinal and other soft tissues: No retroperitoneal mass or adenopathy  Disc levels:  T12-L1: Small right paracentral disc protrusion without significant stenosis. Mild facet degeneration.  L1-2: Mild retrolisthesis. Disc bulging and facet degeneration without significant stenosis  L2-3: Progressive disc degeneration and retrolisthesis. Progressive facet degeneration. Moderate foraminal encroachment bilaterally left greater than right has progressed with potential for impingement of the L2 nerve roots especially on the left. Mild spinal stenosis has progressed.  L3-4: Diffuse disc bulging and facet hypertrophy has progressed. Severe spinal stenosis has progressed. Moderate right foraminal encroachment has progressed  L4-5: Progressive anterolisthesis. Severe facet degeneration has progressed. Moderate spinal stenosis similar to the prior  study  L5-S1: Remote laminectomy on the right with epidural scarring around the right S1 nerve root and thecal sac unchanged. No recurrent disc protrusion or stenosis.  IMPRESSION: Mild spinal stenosis L2-3 has progressed. Progressive disc and facet degeneration. Moderate foraminal encroachment left greater than right has progressed in the interval  Severe spinal stenosis L3-4 has progressed. Moderate right foraminal encroachment also has progressed  Progressive anterolisthesis at L4-5 with moderate spinal stenosis similar to the prior study  Remote laminectomy right L5-S1 with epidural scar on the right. No recurrent disc protrusion or stenosis.   Electronically Signed   By: Marlan Palau M.D.   On: 08/21/2017 13:06    PMFS History: Patient Active Problem List   Diagnosis Date Noted  . Essential hypertension, benign 02/20/2016  . Elevated blood pressure reading 09/29/2011  . Chronic back pain 09/29/2011  . Leg edema 09/29/2011  . Obesity 09/29/2011   Past Medical History:  Diagnosis Date  . Allergy   . Anemia   . Arthritis   . Asthma   . Back pain     Family History  Problem Relation Age of Onset  . Asthma Mother   . Cancer Mother        AML  . Leukemia Mother   . Heart disease Father   .  Hypertension Father   . Kidney disease Father   . Breast cancer Maternal Aunt     Past Surgical History:  Procedure Laterality Date  . ABDOMINAL HYSTERECTOMY  1989   fibroid-no longer needs pap  . BACK SURGERY  1987, 1985   DDD  . CARPAL TUNNEL RELEASE Left 05/28/2016   Procedure: CARPAL TUNNEL RELEASE;  Surgeon: Cindee Salt, MD;  Location: [facility];  Service: Orthopedics;  Laterality: Left;  . TUBAL LIGATION     Social History   Occupational History  . Not on file  Tobacco Use  . Smoking status: Never Smoker  . Smokeless tobacco: Never Used  Substance and Sexual Activity  . Alcohol use: No  . Drug use: No  . Sexual activity: Not  Currently

## 2017-10-01 ENCOUNTER — Encounter (INDEPENDENT_AMBULATORY_CARE_PROVIDER_SITE_OTHER): Payer: Self-pay | Admitting: Orthopaedic Surgery

## 2017-11-05 DIAGNOSIS — IMO0002 Reserved for concepts with insufficient information to code with codable children: Secondary | ICD-10-CM | POA: Insufficient documentation

## 2017-11-05 DIAGNOSIS — E1165 Type 2 diabetes mellitus with hyperglycemia: Secondary | ICD-10-CM | POA: Insufficient documentation

## 2017-11-29 DIAGNOSIS — H40033 Anatomical narrow angle, bilateral: Secondary | ICD-10-CM | POA: Diagnosis not present

## 2017-11-29 DIAGNOSIS — H40013 Open angle with borderline findings, low risk, bilateral: Secondary | ICD-10-CM | POA: Diagnosis not present

## 2017-12-07 ENCOUNTER — Ambulatory Visit: Payer: Medicaid Other

## 2017-12-14 ENCOUNTER — Ambulatory Visit: Payer: Medicaid Other

## 2017-12-21 ENCOUNTER — Ambulatory Visit: Payer: Medicare HMO

## 2018-01-04 ENCOUNTER — Other Ambulatory Visit: Payer: Self-pay | Admitting: Internal Medicine

## 2018-01-04 DIAGNOSIS — Z1231 Encounter for screening mammogram for malignant neoplasm of breast: Secondary | ICD-10-CM

## 2018-01-27 ENCOUNTER — Telehealth (INDEPENDENT_AMBULATORY_CARE_PROVIDER_SITE_OTHER): Payer: Self-pay | Admitting: Orthopaedic Surgery

## 2018-01-27 NOTE — Telephone Encounter (Signed)
noted 

## 2018-01-27 NOTE — Telephone Encounter (Signed)
I spoke with patient in reference to scheduling L-spine surgery in November.  She is leaving for a cruise Sunday and would like to discuss a surgery date after she is seen for an appointment for her right hand.  Appointment made at [facility] office with Dr. Ophelia Charter on 02-09-18.

## 2018-02-08 ENCOUNTER — Ambulatory Visit: Payer: Medicaid Other

## 2018-02-09 ENCOUNTER — Ambulatory Visit (INDEPENDENT_AMBULATORY_CARE_PROVIDER_SITE_OTHER): Payer: Medicare HMO | Admitting: Orthopaedic Surgery

## 2018-02-14 ENCOUNTER — Other Ambulatory Visit: Payer: Self-pay | Admitting: Orthopedic Surgery

## 2018-02-14 ENCOUNTER — Other Ambulatory Visit: Payer: Self-pay

## 2018-02-14 ENCOUNTER — Encounter (HOSPITAL_BASED_OUTPATIENT_CLINIC_OR_DEPARTMENT_OTHER): Payer: Self-pay | Admitting: *Deleted

## 2018-03-05 ENCOUNTER — Emergency Department (HOSPITAL_COMMUNITY)
Admission: EM | Admit: 2018-03-05 | Discharge: 2018-03-05 | Disposition: A | Discharge status: Home / Self Care | Payer: Medicare HMO | Attending: Emergency Medicine | Admitting: Emergency Medicine

## 2018-03-05 ENCOUNTER — Other Ambulatory Visit: Payer: Self-pay

## 2018-03-05 ENCOUNTER — Encounter (HOSPITAL_COMMUNITY): Payer: Self-pay

## 2018-03-05 DIAGNOSIS — Z79899 Other long term (current) drug therapy: Secondary | ICD-10-CM | POA: Insufficient documentation

## 2018-03-05 DIAGNOSIS — I10 Essential (primary) hypertension: Secondary | ICD-10-CM | POA: Insufficient documentation

## 2018-03-05 DIAGNOSIS — M5431 Sciatica, right side: Secondary | ICD-10-CM

## 2018-03-05 DIAGNOSIS — M545 Low back pain: Secondary | ICD-10-CM | POA: Diagnosis present

## 2018-03-05 DIAGNOSIS — E119 Type 2 diabetes mellitus without complications: Secondary | ICD-10-CM | POA: Diagnosis not present

## 2018-03-05 MED ORDER — ONDANSETRON 4 MG PO TBDP
4.0000 mg | ORAL_TABLET | Freq: Once | ORAL | Status: AC
  Administered 2018-03-05: 4 mg via ORAL
  Filled 2018-03-05: qty 1

## 2018-03-05 MED ORDER — DEXAMETHASONE SODIUM PHOSPHATE 10 MG/ML IJ SOLN
10.0000 mg | Freq: Once | INTRAMUSCULAR | Status: AC
  Administered 2018-03-05: 10 mg via INTRAMUSCULAR
  Filled 2018-03-05: qty 1

## 2018-03-05 MED ORDER — HYDROCODONE-ACETAMINOPHEN 5-325 MG PO TABS: 1.0000 | ORAL_TABLET | Freq: Four times a day (QID) | ORAL | 0 refills | Status: DC | PRN

## 2018-03-05 MED ORDER — HYDROMORPHONE HCL 1 MG/ML IJ SOLN
1.0000 mg | Freq: Once | INTRAMUSCULAR | Status: AC
  Administered 2018-03-05: 1 mg via INTRAMUSCULAR
  Filled 2018-03-05: qty 1

## 2018-03-05 NOTE — ED Triage Notes (Addendum)
Pt reports lower lumbar pain started last night while folding clothes, pain shoots down right leg and right groin. Pt says this has happened in the past. Pt denies injury or fall.

## 2018-03-05 NOTE — ED Provider Notes (Signed)
[facility] EMERGENCY DEPARTMENT Provider Note   CSN: [id number] Arrival date & time: 03/05/18  2043     History   Chief Complaint Chief Complaint  Patient presents with  . Back Pain    down right leg    HPI [name] [name] is a 67 y.o. female.  Pt presents to the ED today with low back pain and pain radiating down her right leg.  She said it started last night when she was folding clothes.  She had to come to the ED in January for the same thing.  The medications she was given then helped her pain.  She has not had sx since last night.  No bowel or bladder problems.  She is able to ambulate.  No f/c.     Past Medical History:  Diagnosis Date  . Allergy   . Anemia   . Arthritis   . Asthma    as child only  . Back pain   . Diabetes mellitus without complication (HCC)   . Hypertension   . Hypothyroidism     Patient Active Problem List   Diagnosis Date Noted  . Essential hypertension, benign 02/20/2016  . Elevated blood pressure reading 09/29/2011  . Chronic back pain 09/29/2011  . Leg edema 09/29/2011  . Obesity 09/29/2011    Past Surgical History:  Procedure Laterality Date  . ABDOMINAL HYSTERECTOMY  1989   fibroid-no longer needs pap  . BACK SURGERY  1987, 1985   DDD  . CARPAL TUNNEL RELEASE Left 05/28/2016   Procedure: CARPAL TUNNEL RELEASE;  Surgeon: Cindee Salt, MD;  Location: [facility];  Service: Orthopedics;  Laterality: Left;  . TUBAL LIGATION       OB History   None      Home Medications    Prior to Admission medications   Medication Sig Start Date End Date Taking? Authorizing Provider  amLODipine (NORVASC) 5 MG tablet Take 1 tablet (5 mg total) by mouth daily. 03/04/16   Dettinger, Elige Radon, MD  atorvastatin (LIPITOR) 20 MG tablet  07/15/17   [provider]  calcium carbonate (OSCAL) 1500 (600 Ca) MG TABS tablet Take by mouth 2 (two) times daily with a meal.    [provider]  HYDROcodone-acetaminophen  (NORCO) 5-325 MG tablet Take 1 tablet by mouth every 6 (six) hours as needed for moderate pain. 03/05/18   Jacalyn Lefevre, MD  ibuprofen (ADVIL,MOTRIN) 600 MG tablet Take 1 tablet by mouth as needed. 11/22/15   [provider]  levothyroxine (SYNTHROID, LEVOTHROID) 25 MCG tablet 37 mcg.  07/20/17   [provider]  metFORMIN (GLUCOPHAGE) 500 MG tablet Take by mouth 2 (two) times daily with a meal.    [provider]  traMADol (ULTRAM) 50 MG tablet Take 1 tablet (50 mg total) by mouth every 12 (twelve) hours as needed. 12/22/16   Eldred Manges, MD    Family History Family History  Problem Relation Age of Onset  . Asthma Mother   . Cancer Mother        AML  . Leukemia Mother   . Heart disease Father   . Hypertension Father   . Kidney disease Father   . Breast cancer Maternal Aunt     Social History Social History   Tobacco Use  . Smoking status: Never Smoker  . Smokeless tobacco: Never Used  Substance Use Topics  . Alcohol use: Yes    Comment: social  . Drug use: No  Allergies   Flagyl [metronidazole]   Review of Systems Review of Systems  Musculoskeletal: Positive for back pain.  Neurological:       Pain shooting down right leg  All other systems reviewed and are negative.    Physical Exam Updated Vital Signs BP (!) 146/79 (BP Location: Left Arm)   Pulse 84   Temp 99.3 F (37.4 C) (Oral)   Resp 20   Ht 5\' 8"  (1.727 m)   Wt 98 kg   SpO2 98%   BMI 32.84 kg/m   Physical Exam  Constitutional: She is oriented to person, place, and time. She appears well-developed and well-nourished.  HENT:  Head: Normocephalic and atraumatic.  Right Ear: External ear normal.  Left Ear: External ear normal.  Nose: Nose normal.  Mouth/Throat: Oropharynx is clear and moist.  Eyes: Pupils are equal, round, and reactive to light. Conjunctivae and EOM are normal.  Neck: Normal range of motion. Neck supple.  Cardiovascular: Normal rate, regular  rhythm, normal heart sounds and intact distal pulses.  Pulmonary/Chest: Effort normal and breath sounds normal.  Abdominal: Soft. Bowel sounds are normal.  Musculoskeletal:       Lumbar back: She exhibits tenderness.  Neurological: She is alert and oriented to person, place, and time.  Skin: Skin is warm. Capillary refill takes less than 2 seconds.  Psychiatric: She has a normal mood and affect. Her behavior is normal. Judgment and thought content normal.  Nursing note and vitals reviewed.    ED Treatments / Results  Labs (all labs ordered are listed, but only abnormal results are displayed) Labs Reviewed - No data to display  EKG None  Radiology No results found.  Procedures Procedures (including critical care time)  Medications Ordered in ED Medications  HYDROmorphone (DILAUDID) injection 1 mg (1 mg Intramuscular Given 03/05/18 2125)  ondansetron (ZOFRAN-ODT) disintegrating tablet 4 mg (4 mg Oral Given 03/05/18 2125)  dexamethasone (DECADRON) injection 10 mg (10 mg Intramuscular Given 03/05/18 2124)     Initial Impression / Assessment and Plan / ED Course  I have reviewed the triage vital signs and the nursing notes.  Pertinent labs & imaging results that were available during my care of the patient were reviewed by me and considered in my medical decision making (see chart for details).    Sx c/w sciatica.  She has DM, so is not sent home with prednisone, but is given a dose of decadron here.  Pt knows to keep an eye on her blood sugar for the next few days.  The pt is feeling better.  She knows to return if worse and to f/u with pcp.  Final Clinical Impressions(s) / ED Diagnoses   Final diagnoses:  Sciatica of right side    ED Discharge Orders         Ordered    HYDROcodone-acetaminophen (NORCO) 5-325 MG tablet  Every 6 hours PRN     03/05/18 2138           Jacalyn LefevreHaviland, Gottlieb Zuercher, MD 03/05/18 2148

## 2018-03-09 ENCOUNTER — Encounter (HOSPITAL_BASED_OUTPATIENT_CLINIC_OR_DEPARTMENT_OTHER): Payer: Self-pay | Admitting: *Deleted

## 2018-03-09 ENCOUNTER — Ambulatory Visit (INDEPENDENT_AMBULATORY_CARE_PROVIDER_SITE_OTHER): Payer: Medicare HMO | Admitting: Orthopaedic Surgery

## 2018-03-15 ENCOUNTER — Encounter (HOSPITAL_BASED_OUTPATIENT_CLINIC_OR_DEPARTMENT_OTHER)
Admission: RE | Admit: 2018-03-15 | Discharge: 2018-03-15 | Disposition: A | Discharge status: Home / Self Care | Payer: Medicare HMO | Source: Ambulatory Visit | Attending: Orthopedic Surgery | Admitting: Orthopedic Surgery

## 2018-03-15 DIAGNOSIS — E119 Type 2 diabetes mellitus without complications: Secondary | ICD-10-CM | POA: Diagnosis not present

## 2018-03-15 DIAGNOSIS — R200 Anesthesia of skin: Secondary | ICD-10-CM | POA: Diagnosis present

## 2018-03-15 DIAGNOSIS — Z7984 Long term (current) use of oral hypoglycemic drugs: Secondary | ICD-10-CM | POA: Diagnosis not present

## 2018-03-15 DIAGNOSIS — I10 Essential (primary) hypertension: Secondary | ICD-10-CM | POA: Diagnosis not present

## 2018-03-15 DIAGNOSIS — G5603 Carpal tunnel syndrome, bilateral upper limbs: Secondary | ICD-10-CM | POA: Diagnosis not present

## 2018-03-15 LAB — BASIC METABOLIC PANEL
Anion gap: 11 (ref 5–15)
BUN: 11 mg/dL (ref 8–23)
CO2: 22 mmol/L (ref 22–32)
Calcium: 9.6 mg/dL (ref 8.9–10.3)
Chloride: 110 mmol/L (ref 98–111)
Creatinine, Ser: 0.8 mg/dL (ref 0.44–1.00)
GFR calc Af Amer: >60 mL/min (ref >60)
GFR calc non Af Amer: >60 mL/min (ref >60)
Glucose, Bld: 109 mg/dL — ABNORMAL HIGH (ref 70–99)
Potassium: 3.8 mmol/L (ref 3.5–5.1)
Sodium: 143 mmol/L (ref 135–145)

## 2018-03-17 ENCOUNTER — Other Ambulatory Visit: Payer: Self-pay

## 2018-03-17 ENCOUNTER — Encounter (HOSPITAL_BASED_OUTPATIENT_CLINIC_OR_DEPARTMENT_OTHER)
Admission: RE | Disposition: A | Discharge status: Home / Self Care | Payer: Self-pay | Source: Ambulatory Visit | Attending: Orthopedic Surgery

## 2018-03-17 ENCOUNTER — Encounter (HOSPITAL_BASED_OUTPATIENT_CLINIC_OR_DEPARTMENT_OTHER): Payer: Self-pay

## 2018-03-17 ENCOUNTER — Ambulatory Visit (HOSPITAL_BASED_OUTPATIENT_CLINIC_OR_DEPARTMENT_OTHER): Payer: Medicare HMO | Admitting: Anesthesiology

## 2018-03-17 ENCOUNTER — Ambulatory Visit (HOSPITAL_BASED_OUTPATIENT_CLINIC_OR_DEPARTMENT_OTHER)
Admission: RE | Admit: 2018-03-17 | Discharge: 2018-03-17 | Disposition: A | Discharge status: Home / Self Care | Payer: Medicare HMO | Source: Ambulatory Visit | Attending: Orthopedic Surgery | Admitting: Orthopedic Surgery

## 2018-03-17 DIAGNOSIS — G5603 Carpal tunnel syndrome, bilateral upper limbs: Secondary | ICD-10-CM | POA: Diagnosis not present

## 2018-03-17 DIAGNOSIS — Z7984 Long term (current) use of oral hypoglycemic drugs: Secondary | ICD-10-CM | POA: Insufficient documentation

## 2018-03-17 DIAGNOSIS — I10 Essential (primary) hypertension: Secondary | ICD-10-CM | POA: Diagnosis not present

## 2018-03-17 DIAGNOSIS — E119 Type 2 diabetes mellitus without complications: Secondary | ICD-10-CM | POA: Diagnosis not present

## 2018-03-17 HISTORY — DX: Type 2 diabetes mellitus without complications: E11.9

## 2018-03-17 HISTORY — DX: Essential (primary) hypertension: I10

## 2018-03-17 HISTORY — DX: Hypothyroidism, unspecified: E03.9

## 2018-03-17 HISTORY — PX: CARPAL TUNNEL RELEASE: SHX101

## 2018-03-17 LAB — GLUCOSE, CAPILLARY
Glucose-Capillary: 101 mg/dL — ABNORMAL HIGH (ref 70–99)
Glucose-Capillary: 94 mg/dL (ref 70–99)

## 2018-03-17 SURGERY — CARPAL TUNNEL RELEASE
Anesthesia: Monitor Anesthesia Care | Site: Wrist | Laterality: Right

## 2018-03-17 MED ORDER — SCOPOLAMINE 1 MG/3DAYS TD PT72: 1.0000 | MEDICATED_PATCH | Freq: Once | TRANSDERMAL | Status: DC | PRN

## 2018-03-17 MED ORDER — FENTANYL CITRATE (PF) 100 MCG/2ML IJ SOLN: 50.0000 ug | INTRAMUSCULAR | Status: DC | PRN

## 2018-03-17 MED ORDER — LIDOCAINE HCL (PF) 0.5 % IJ SOLN
INTRAMUSCULAR | Status: DC | PRN
  Administered 2018-03-17: 30 mL via INTRAVENOUS

## 2018-03-17 MED ORDER — HYDROMORPHONE HCL 1 MG/ML IJ SOLN: 0.2500 mg | INTRAMUSCULAR | Status: DC | PRN

## 2018-03-17 MED ORDER — LACTATED RINGERS IV SOLN
INTRAVENOUS | Status: DC
  Administered 2018-03-17: 10:00:00 via INTRAVENOUS

## 2018-03-17 MED ORDER — MIDAZOLAM HCL 2 MG/2ML IJ SOLN: 1.0000 mg | INTRAMUSCULAR | Status: DC | PRN

## 2018-03-17 MED ORDER — CHLORHEXIDINE GLUCONATE 4 % EX LIQD: 60.0000 mL | Freq: Once | CUTANEOUS | Status: DC

## 2018-03-17 MED ORDER — PROPOFOL 500 MG/50ML IV EMUL
INTRAVENOUS | Status: AC
  Filled 2018-03-17: qty 50

## 2018-03-17 MED ORDER — CEFAZOLIN SODIUM-DEXTROSE 2-4 GM/100ML-% IV SOLN
2.0000 g | INTRAVENOUS | Status: AC
  Administered 2018-03-17: 2 g via INTRAVENOUS

## 2018-03-17 MED ORDER — ONDANSETRON HCL 4 MG/2ML IJ SOLN
INTRAMUSCULAR | Status: DC | PRN
  Administered 2018-03-17: 4 mg via INTRAVENOUS

## 2018-03-17 MED ORDER — MIDAZOLAM HCL 2 MG/2ML IJ SOLN
INTRAMUSCULAR | Status: AC
  Filled 2018-03-17: qty 2

## 2018-03-17 MED ORDER — BUPIVACAINE HCL (PF) 0.25 % IJ SOLN
INTRAMUSCULAR | Status: DC | PRN
  Administered 2018-03-17: 9 mL

## 2018-03-17 MED ORDER — FENTANYL CITRATE (PF) 100 MCG/2ML IJ SOLN
INTRAMUSCULAR | Status: DC | PRN
  Administered 2018-03-17 (×2): 50 ug via INTRAVENOUS

## 2018-03-17 MED ORDER — ONDANSETRON HCL 4 MG/2ML IJ SOLN
INTRAMUSCULAR | Status: AC
  Filled 2018-03-17: qty 2

## 2018-03-17 MED ORDER — CEFAZOLIN SODIUM-DEXTROSE 2-4 GM/100ML-% IV SOLN
INTRAVENOUS | Status: AC
  Filled 2018-03-17: qty 100

## 2018-03-17 MED ORDER — MIDAZOLAM HCL 2 MG/2ML IJ SOLN
INTRAMUSCULAR | Status: DC | PRN
  Administered 2018-03-17: 2 mg via INTRAVENOUS

## 2018-03-17 MED ORDER — LIDOCAINE 2% (20 MG/ML) 5 ML SYRINGE
INTRAMUSCULAR | Status: AC
  Filled 2018-03-17: qty 5

## 2018-03-17 MED ORDER — FENTANYL CITRATE (PF) 100 MCG/2ML IJ SOLN
INTRAMUSCULAR | Status: AC
  Filled 2018-03-17: qty 2

## 2018-03-17 SURGICAL SUPPLY — 34 items
BLADE SURG 15 STRL LF DISP TIS (BLADE) ×1 IMPLANT
BLADE SURG 15 STRL SS (BLADE) ×1
BNDG COHESIVE 3X5 TAN STRL LF (GAUZE/BANDAGES/DRESSINGS) ×2 IMPLANT
BNDG ESMARK 4X9 LF (GAUZE/BANDAGES/DRESSINGS) IMPLANT
BNDG GAUZE ELAST 4 BULKY (GAUZE/BANDAGES/DRESSINGS) ×2 IMPLANT
CHLORAPREP W/TINT 26ML (MISCELLANEOUS) ×2 IMPLANT
CORD BIPOLAR FORCEPS 12FT (ELECTRODE) ×2 IMPLANT
COVER BACK TABLE 60X90IN (DRAPES) ×2 IMPLANT
COVER MAYO STAND STRL (DRAPES) ×2 IMPLANT
COVER WAND RF STERILE (DRAPES) IMPLANT
CUFF TOURNIQUET SINGLE 18IN (TOURNIQUET CUFF) ×2 IMPLANT
DRAPE EXTREMITY T 121X128X90 (DRAPE) ×2 IMPLANT
DRAPE SURG 17X23 STRL (DRAPES) ×2 IMPLANT
DRSG PAD ABDOMINAL 8X10 ST (GAUZE/BANDAGES/DRESSINGS) ×2 IMPLANT
GAUZE SPONGE 4X4 12PLY STRL (GAUZE/BANDAGES/DRESSINGS) ×2 IMPLANT
GAUZE XEROFORM 1X8 LF (GAUZE/BANDAGES/DRESSINGS) ×2 IMPLANT
GLOVE BIOGEL PI IND STRL 7.0 (GLOVE) ×2 IMPLANT
GLOVE BIOGEL PI IND STRL 8.5 (GLOVE) ×1 IMPLANT
GLOVE BIOGEL PI INDICATOR 7.0 (GLOVE) ×2
GLOVE BIOGEL PI INDICATOR 8.5 (GLOVE) ×1
GLOVE SURG ORTHO 8.0 STRL STRW (GLOVE) ×2 IMPLANT
GOWN STRL REUS W/ TWL LRG LVL3 (GOWN DISPOSABLE) ×1 IMPLANT
GOWN STRL REUS W/TWL LRG LVL3 (GOWN DISPOSABLE) ×1
GOWN STRL REUS W/TWL XL LVL3 (GOWN DISPOSABLE) ×2 IMPLANT
NEEDLE PRECISIONGLIDE 27X1.5 (NEEDLE) IMPLANT
NS IRRIG 1000ML POUR BTL (IV SOLUTION) ×2 IMPLANT
PACK BASIN DAY SURGERY FS (CUSTOM PROCEDURE TRAY) ×2 IMPLANT
STOCKINETTE 4X48 STRL (DRAPES) ×2 IMPLANT
SUT ETHILON 4 0 PS 2 18 (SUTURE) ×2 IMPLANT
SUT VICRYL 4-0 PS2 18IN ABS (SUTURE) IMPLANT
SYR BULB 3OZ (MISCELLANEOUS) ×2 IMPLANT
SYR CONTROL 10ML LL (SYRINGE) IMPLANT
TOWEL GREEN STERILE FF (TOWEL DISPOSABLE) ×2 IMPLANT
UNDERPAD 30X30 (UNDERPADS AND DIAPERS) ×2 IMPLANT

## 2018-03-17 NOTE — Op Note (Signed)
NAME: [place] A Ed[place] MEDICAL RECORD NO: [id number] DATE OF BIRTH: 07/31/1950 [facility]ATION: [facility] PHYSICIAN: Nicki ReaperGARY R. Keora Eccleston, MD   OPERATIVE REPORT   DATE OF PROCEDURE: 03/17/18    PREOPERATIVE DIAGNOSIS:   Carpal tunnel syndrome right hand   POSTOPERATIVE DIAGNOSIS:   Same   PROCEDURE:   Decompression median nerve right hand   SURGEON: Cindee SaltGary Abraham Entwistle, M.D.   ASSISTANT: none   ANESTHESIA:  Bier block with sedation and Local   INTRAVENOUS FLUIDS:  Per anesthesia flow sheet.   ESTIMATED BLOOD LOSS:  Minimal.   COMPLICATIONS:  None.   SPECIMENS:  none   TOURNIQUET TIME:    Total Tourniquet Time Documented: Forearm (Right) - 19 minutes Total: Forearm (Right) - 19 minutes    DISPOSITION:  Stable to PACU.   INDICATIONS: Patient is a 67-year-old female with a history of bilateral carpal tunnel syndrome nerve conductions positive which has not responded to conservative treatment.  Nerve conductions have been abnormal as she has had a release on her left side she is admitted now for release on her right.  Pre-peri-and postoperative course been discussed along with risks and complications.  She is aware there is no guarantee to the surgery the possibility of infection recurrence injury to arteries nerves tendons incomplete relief of symptoms and dystrophy.  She is aware that we are trying to halt the prior process and hopefully allow the nerve the opportunity to get better.  In the preoperative area the patient is seen the extremity marked by both patient and surgeon antibiotic given  OPERATIVE COURSE: Patient is brought to the operating room where form based IV regional anesthetic was carried out without difficulty under the direction the anesthesia department.  She was prepped using ChloraPrep in a supine position with the right arm free.  A three-minute dry time was allowed and timeout taken to confirm patient procedure.  The longitudinal incision was  made carried down through subcutaneous tissue and bleeders electrocauterized bipolar.  The palmar fascia was split.  The superficial palmar arch was identified.  The flexor tendon of the ring little finger was identified.  She had a large ulnar muscle coming across the flexor retinaculum this was elevated and endings the flexor retinaculum was incised beneath it after placing retractors through it to protect it.  The remainder of the flexor retinaculum was then released on the ulnar border a right angle and saw retractor placed between skin and forearm fascia the deep structures dissected free blunt scissors were then used to transect the proximal aspect of the flexor retinaculum distal forearm fascia for approximately 2 cm proximal to the wrist crease under direct vision.  The nerve showed an extensive hyperemia.  This was over a prolonged area.  No further lesions were identified.  The motor branch entered the muscle distally.  The wound was copiously irrigated with saline.  The skin was closed interrupted 4-0 nylon sutures.  Local infiltration quarter percent bupivacaine without epinephrine was given approximately 8 cc was used.  A sterile compressive dressing with the fingers free was applied.  Inflation of the tourniquet all fingers immediately pink.  She was taken to the recovery room for observation in satisfactory condition.  [facility] be [facility] on Tylenol and ibuprofen.  She has Norco from an ER visit.   Cindee SaltGary Bonny Egger, MD Electronically signed, 03/17/18

## 2018-03-17 NOTE — Brief Op Note (Signed)
03/17/2018  12:06 PM  PATIENT:  [name] [name]  67 y.o. female  PRE-OPERATIVE DIAGNOSIS:  RIGHT CARPAL TUNNEL SYNDROME  POST-OPERATIVE DIAGNOSIS:  RIGHT CARPAL TUNNEL SYNDROME  PROCEDURE:  Procedure(s): CARPAL TUNNEL RELEASE (Right)  SURGEON:  Surgeon(s) and Role:    * Cindee SaltKuzma, Madell Heino, MD - Primary  PHYSICIAN ASSISTANT:   ASSISTANTS: none   ANESTHESIA:   local, regional and IV sedation  EBL:  0 mL   BLOOD ADMINISTERED:none  DRAINS: none   LOCAL MEDICATIONS USED:  BUPIVICAINE   SPECIMEN:  No Specimen  DISPOSITION OF SPECIMEN:  N/A  COUNTS:  YES  TOURNIQUET:   Total Tourniquet Time Documented: Forearm (Right) - 19 minutes Total: Forearm (Right) - 19 minutes   DICTATION: .Reubin Milanragon Dictation  PLAN OF CARE: Discharge to home after PACU  PATIENT DISPOSITION:  PACU - hemodynamically stable.

## 2018-03-17 NOTE — Discharge Instructions (Addendum)

## 2018-03-17 NOTE — Transfer of Care (Signed)
Immediate Anesthesia Transfer of Care Note  Patient: [place] A Lask[place]  Procedure(s) Performed: CARPAL TUNNEL RELEASE (Right Wrist)  Patient Location: PACU  Anesthesia Type:Bier block  Level of Consciousness: awake, alert , oriented and patient cooperative  Airway & Oxygen Therapy: Patient Spontanous Breathing  Post-op Assessment: Report given to RN and Post -op Vital signs reviewed and stable  Post vital signs: Reviewed and stable  Last Vitals:  Vitals Value Taken Time  BP    Temp    Pulse 70 03/17/2018 12:05 PM  Resp 8 03/17/2018 12:05 PM  SpO2 96 % 03/17/2018 12:05 PM  Vitals shown include unvalidated device data.  Last Pain:  Vitals:   03/17/18 0950  TempSrc: Oral  PainSc: 0-No pain         Complications: No apparent anesthesia complications

## 2018-03-17 NOTE — H&P (Signed)
[name] [name] A Rasmusson is an 67 y.o. female.   Chief Complaint: numbness right hand HPI: [name] [name] is a 67 year old female with carpal tunnel syndrome right hand. She has undergone a decompression of the left side in the past. She is doing well on that side. Continues to complain of relatively constant numbness and tingling of her right hand median nerve distribution. It is waking her almost every night. She states nothing seems to make it better or worse for her. She states conservative treatment has not really resolve this for her. She has had nerve conductions done by Dr. Riccardo DubinKarvelas revealing a carpal tunnel syndrome on her right side with a motor delay of 5.10. Family histo she does have a history of diabetes and arthritis   Past Medical History:  Diagnosis Date  . Allergy   . Anemia   . Arthritis   . Asthma    as child only  . Back pain   . Diabetes mellitus without complication (HCC)   . Hypertension   . Hypothyroidism     Past Surgical History:  Procedure Laterality Date  . ABDOMINAL HYSTERECTOMY  1989   fibroid-no longer needs pap  . BACK SURGERY  1987, 1985   DDD  . CARPAL TUNNEL RELEASE Left 05/28/2016   Procedure: CARPAL TUNNEL RELEASE;  Surgeon: Cindee SaltGary Buford Bremer, MD;  Location: [facility];  Service: Orthopedics;  Laterality: Left;  . TUBAL LIGATION      Family History  Problem Relation Age of Onset  . Asthma Mother   . Cancer Mother        AML  . Leukemia Mother   . Heart disease Father   . Hypertension Father   . Kidney disease Father   . Breast cancer Maternal Aunt    Social History:  reports that she has never smoked. She has never used smokeless tobacco. She reports current alcohol use. She reports that she does not use drugs.  Allergies:  Allergies  Allergen Reactions  . Flagyl [Metronidazole] Rash    No medications prior to admission.    Results for orders placed or performed during the hospital encounter of 03/17/18 (from the past 48 hour(s))   Basic metabolic panel     Status: Abnormal   Collection Time: 03/15/18  9:46 AM  Result Value Ref Range   Sodium 143 135 - 145 mmol/L   Potassium 3.8 3.5 - 5.1 mmol/L   Chloride 110 98 - 111 mmol/L   CO2 22 22 - 32 mmol/L   Glucose, Bld 109 (H) 70 - 99 mg/dL   BUN 11 8 - 23 mg/dL   Creatinine, Ser 9.600.80 0.44 - 1.00 mg/dL   Calcium 9.6 8.9 - 45.410.3 mg/dL   GFR calc non Af Amer >60 >60 mL/min   GFR calc Af Amer >60 >60 mL/min   Anion gap 11 5 - 15    Comment: Performed at [facility], [address] [place], [place] [address]    No results found.   Pertinent items are noted in HPI.  Height 5' 8.5" (1.74 m), weight 98 kg.  General appearance: alert, cooperative and appears stated age Head: Normocephalic, without obvious abnormality Neck: no JVD Resp: clear to auscultation bilaterally Cardio: regular rate and rhythm, S1, S2 normal, no murmur, click, rub or gallop GI: soft, non-tender; bowel sounds normal; no masses,  no organomegaly Extremities: numbness right hand Pulses: 2+ and symmetric Skin: Skin color, texture, turgor normal. No rashes or lesions Neurologic: Grossly normal Incision/Wound: na  Assessment/Plan Assessment:  1. Bilateral carpal tunnel syndrome      Plan: Would like to proceed with a right carpal tunnel release. This will be scheduled for an outpatient under regional anesthesia. Pre-peri-and postoperative course been discussed along with risks and complications. She is aware that there is no guarantee to the surgery possibility of infection recurrence injury to arteries nerves tendons complete relief symptoms dystrophy. She is scheduled for carpal tunnel release right hand as an outpatient under regional anesthesia.    Cindee Salt 03/17/2018, 8:09 AM

## 2018-03-17 NOTE — Anesthesia Preprocedure Evaluation (Addendum)
Anesthesia Evaluation  Patient identified by MRN, date of birth, ID band Patient awake    Reviewed: Allergy & Precautions, H&P , NPO status , Patient's Chart, lab work & pertinent test results  Airway Mallampati: II  TM Distance: >3 FB Neck ROM: Full    Dental no notable dental hx. (+) Teeth Intact, Dental Advisory Given   Pulmonary asthma ,    Pulmonary exam normal breath sounds clear to auscultation       Cardiovascular hypertension, Pt. on medications  Rhythm:Regular Rate:Normal     Neuro/Psych negative neurological ROS  negative psych ROS   GI/Hepatic negative GI ROS, Neg liver ROS,   Endo/Other  diabetes, Type 2, Oral Hypoglycemic AgentsHypothyroidism   Renal/GU negative Renal ROS  negative genitourinary   Musculoskeletal  (+) Arthritis , Osteoarthritis,    Abdominal   Peds  Hematology negative hematology ROS (+)   Anesthesia Other Findings   Reproductive/Obstetrics negative OB ROS                            Anesthesia Physical Anesthesia Plan  ASA: II  Anesthesia Plan: MAC and Bier Block and Bier Block-LIDOCAINE ONLY   Post-op Pain Management:    Induction: Intravenous  PONV Risk Score and Plan: 3 and Ondansetron, Propofol infusion and Midazolam  Airway Management Planned: Simple Face Mask  Additional Equipment:   Intra-op Plan:   Post-operative Plan:   Informed Consent: I have reviewed the patients History and Physical, chart, labs and discussed the procedure including the risks, benefits and alternatives for the proposed anesthesia with the patient or authorized representative who has indicated his/her understanding and acceptance.   Dental advisory given  Plan Discussed with: CRNA  Anesthesia Plan Comments:         Anesthesia Quick Evaluation

## 2018-03-17 NOTE — Anesthesia Postprocedure Evaluation (Signed)
Anesthesia Post Note  Patient: [name] [name]  Procedure(s) Performed: CARPAL TUNNEL RELEASE (Right Wrist)     Patient location during evaluation: PACU Anesthesia Type: MAC Level of consciousness: awake and alert Pain management: pain level controlled Vital Signs Assessment: post-procedure vital signs reviewed and stable Respiratory status: spontaneous breathing, nonlabored ventilation and respiratory function stable Cardiovascular status: stable and blood pressure returned to baseline Postop Assessment: no apparent nausea or vomiting Anesthetic complications: no    Last Vitals:  Vitals:   03/17/18 1219 03/17/18 1229  BP:  118/76  Pulse: 66 68  Resp: 11 16  Temp:  37 C  SpO2: 96% 96%    Last Pain:  Vitals:   03/17/18 1229  TempSrc:   PainSc: 0-No pain                 Kiyra Slaubaugh,W. EDMOND

## 2018-03-18 ENCOUNTER — Encounter (HOSPITAL_BASED_OUTPATIENT_CLINIC_OR_DEPARTMENT_OTHER): Payer: Self-pay | Admitting: Orthopedic Surgery

## 2018-04-20 ENCOUNTER — Ambulatory Visit (INDEPENDENT_AMBULATORY_CARE_PROVIDER_SITE_OTHER): Payer: Medicare HMO | Admitting: Orthopaedic Surgery

## 2018-04-20 ENCOUNTER — Encounter (INDEPENDENT_AMBULATORY_CARE_PROVIDER_SITE_OTHER): Payer: Self-pay | Admitting: Orthopaedic Surgery

## 2018-04-20 VITALS — BP 132/84 | HR 80

## 2018-04-20 DIAGNOSIS — M48062 Spinal stenosis, lumbar region with neurogenic claudication: Secondary | ICD-10-CM

## 2018-04-20 DIAGNOSIS — M7541 Impingement syndrome of right shoulder: Secondary | ICD-10-CM | POA: Diagnosis not present

## 2018-04-20 MED ORDER — METHYLPREDNISOLONE ACETATE 40 MG/ML IJ SUSP
40.0000 mg | INTRAMUSCULAR | Status: AC | PRN
  Administered 2018-04-20: 40 mg via INTRA_ARTICULAR

## 2018-04-20 MED ORDER — BUPIVACAINE HCL 0.25 % IJ SOLN
4.0000 mL | INTRAMUSCULAR | Status: AC | PRN
  Administered 2018-04-20: 4 mL via INTRA_ARTICULAR

## 2018-04-20 MED ORDER — LIDOCAINE HCL 1 % IJ SOLN
0.5000 mL | INTRAMUSCULAR | Status: AC | PRN
  Administered 2018-04-20: .5 mL

## 2018-04-20 NOTE — Progress Notes (Signed)
Office Visit Note   Patient: [name] [name]           Date of Birth: [birth year]           MRN: [id number] Visit Date: 04/20/2018              Requested by: Harvest Forest, MD [address] [name], [place] [address] PCP: Harvest Forest, MD   Assessment & Plan: Visit Diagnoses:  1. Spinal stenosis of lumbar region with neurogenic claudication   2. Impingement syndrome of right shoulder     Plan: Subacromial injection performed with good relief.  We will check her back in 4 weeks for recheck.  She like to schedule lumbar surgery for her severe stenosis later in the year.  She has persistent problems with the right shoulder we can proceed with diagnostic imaging.  MRI scan lumbar report and images were reviewed again with her today for preoperative discussion when she decides she like to proceed with surgery of her lumbar spine.  Follow-Up Instructions: Return in about 4 weeks (around 05/18/2018).   Orders:  Orders Placed This Encounter  Procedures  . Large Joint Inj: R subacromial bursa   No orders of the defined types were placed in this encounter.     Procedures: Large Joint Inj: R subacromial bursa on 04/20/2018 11:20 AM Indications: pain Details: 22 G 1.5 in needle  Arthrogram: No  Medications: 4 mL bupivacaine 0.25 %; 40 mg methylPREDNISolone acetate 40 MG/ML; 0.5 mL lidocaine 1 % Outcome: tolerated well, no immediate complications Procedure, treatment alternatives, risks and benefits explained, specific risks discussed. Consent was given by the patient. Immediately prior to procedure a time out was called to verify the correct patient, procedure, equipment, support staff and site/side marked as required. Patient was prepped and draped in the usual sterile fashion.       Clinical Data: No additional findings.   Subjective: Chief Complaint  Patient presents with  . Lower Back - Follow-up, Pain  . Right Shoulder - Follow-up  . Results     HPI 68 year old female returns with right shoulder pain with motion as well as low back pain is been persistently bothering her since last year.  She is taken some hydrocodone prescribed by an oral surgeon to help with the pain in her shoulder.  Shoulder pain wakes her up at night.  MRI scan was performed 08/21/2017 which showed severe spinal stenosis at L3-4 which had progressed.  Patient has progressive anterolisthesis with severe facet degeneration and moderate stenosis at L4-5.  Previous hemilaminectomy on the right with some lateral recess stenosis at L4-5 on the left.  Recent carpal tunnel release by Dr. Merlyn Lot in December is doing well.  She denies associated neck pain.  Patient states she would like to schedule surgery for her lumbar spine later on in the year and is requesting treatment for her shoulder which is now severely painful.  Review of Systems 14 point update review of systems from 6 /27-2019 unchanged other than as mentioned in HPI.  Positive for neurogenic claudication symptoms with severe stenosis multilevel.   Objective: Vital Signs: BP 132/84   Pulse 80   Physical Exam Constitutional:      Appearance: She is well-developed.  HENT:     Head: Normocephalic.     Right Ear: External ear normal.     Left Ear: External ear normal.  Eyes:     Pupils: Pupils are equal, round, and reactive to light.  Neck:     Thyroid: No thyromegaly.     Trachea: No tracheal deviation.  Cardiovascular:     Rate and Rhythm: Normal rate.  Pulmonary:     Effort: Pulmonary effort is normal.  Abdominal:     Palpations: Abdomen is soft.  Skin:    General: Skin is warm and dry.  Neurological:     Mental Status: She is alert and oriented to person, place, and time.  Psychiatric:        Behavior: Behavior normal.     Ortho Exam well-healed carpal tunnel incisions with intact sensation to the fingers.  Mild sciatic notch tenderness.  Well-healed lumbar incision.  Anterior tib  gastrocsoleus is intact.  She ambulates without a limp.  She is able to heel and toe walk.  Distal pulses are intact.  Negative hip range of motion.  Left shoulder has full range of motion right shoulder abduction to 100 degrees with sharp pain.  Positive internal rotation reproduces her pain with positive Neer mild anterior shoulder biceps tendon tenderness.  Negative drop arm test.  Upper extremity reflexes lower extremity reflexes are 2+ and symmetrical.  Specialty Comments:  No specialty comments available.  Imaging: CLINICAL DATA:  Lumbar spinal stenosis.  Back pain  [facility] at [address]  Results: Creatinine 0.6 mg/dL.  EXAM: MRI LUMBAR SPINE WITHOUT AND WITH CONTRAST  TECHNIQUE: Multiplanar and multiecho pulse sequences of the lumbar spine were obtained without and with intravenous contrast.  CONTRAST:  20mL MULTIHANCE GADOBENATE DIMEGLUMINE 529 MG/ML IV SOLN  COMPARISON:  Lumbar MRI 08/10/2009  FINDINGS: Segmentation:  Normal  Alignment: Mild retrolisthesis L1-2 and L2-3. 5 mm anterolisthesis L4-5 has progressed.  Vertebrae: Negative for fracture or mass. Fatty endplate changes at L5-S1 due to disc degeneration similar to the prior study.  Conus medullaris and cauda equina: Conus extends to the L1-2 level. Conus and cauda equina appear normal.  Paraspinal and other soft tissues: No retroperitoneal mass or adenopathy  Disc levels:  T12-L1: Small right paracentral disc protrusion without significant stenosis. Mild facet degeneration.  L1-2: Mild retrolisthesis. Disc bulging and facet degeneration without significant stenosis  L2-3: Progressive disc degeneration and retrolisthesis. Progressive facet degeneration. Moderate foraminal encroachment bilaterally left greater than right has progressed with potential for impingement of the L2 nerve roots especially on the left. Mild spinal stenosis  has progressed.  L3-4: Diffuse disc bulging and facet hypertrophy has progressed. Severe spinal stenosis has progressed. Moderate right foraminal encroachment has progressed  L4-5: Progressive anterolisthesis. Severe facet degeneration has progressed. Moderate spinal stenosis similar to the prior study  L5-S1: Remote laminectomy on the right with epidural scarring around the right S1 nerve root and thecal sac unchanged. No recurrent disc protrusion or stenosis.  IMPRESSION: Mild spinal stenosis L2-3 has progressed. Progressive disc and facet degeneration. Moderate foraminal encroachment left greater than right has progressed in the interval  Severe spinal stenosis L3-4 has progressed. Moderate right foraminal encroachment also has progressed  Progressive anterolisthesis at L4-5 with moderate spinal stenosis similar to the prior study  Remote laminectomy right L5-S1 with epidural scar on the right. No recurrent disc protrusion or stenosis.   Electronically Signed   By: Marlan Palauharles  Clark M.D.   On: 08/21/2017 13:06   PMFS History: Patient Active Problem List   Diagnosis Date Noted  . Spinal stenosis of lumbar region with neurogenic claudication 04/20/2018  . Impingement syndrome of right shoulder 04/20/2018  . Essential hypertension, benign  02/20/2016  . Elevated blood pressure reading 09/29/2011  . Chronic back pain 09/29/2011  . Leg edema 09/29/2011  . Obesity 09/29/2011   Past Medical History:  Diagnosis Date  . Allergy   . Anemia   . Arthritis   . Asthma    as child only  . Back pain   . Diabetes mellitus without complication (HCC)   . Hypertension   . Hypothyroidism     Family History  Problem Relation Age of Onset  . Asthma Mother   . Cancer Mother        AML  . Leukemia Mother   . Heart disease Father   . Hypertension Father   . Kidney disease Father   . Breast cancer Maternal Aunt     Past Surgical History:  Procedure Laterality Date   . ABDOMINAL HYSTERECTOMY  1989   fibroid-no longer needs pap  . BACK SURGERY  1987, 1985   DDD  . CARPAL TUNNEL RELEASE Left 05/28/2016   Procedure: CARPAL TUNNEL RELEASE;  Surgeon: Cindee Salt, MD;  Location: [facility];  Service: Orthopedics;  Laterality: Left;  . CARPAL TUNNEL RELEASE Right 03/17/2018   Procedure: CARPAL TUNNEL RELEASE;  Surgeon: Cindee Salt, MD;  Location: [facility];  Service: Orthopedics;  Laterality: Right;  . TUBAL LIGATION     Social History   Occupational History  . Not on file  Tobacco Use  . Smoking status: Never Smoker  . Smokeless tobacco: Never Used  Substance and Sexual Activity  . Alcohol use: Yes    Comment: social  . Drug use: No  . Sexual activity: Not Currently

## 2018-05-25 ENCOUNTER — Encounter (INDEPENDENT_AMBULATORY_CARE_PROVIDER_SITE_OTHER): Payer: Self-pay | Admitting: Orthopaedic Surgery

## 2018-05-25 ENCOUNTER — Ambulatory Visit (INDEPENDENT_AMBULATORY_CARE_PROVIDER_SITE_OTHER): Payer: Medicare HMO | Admitting: Orthopaedic Surgery

## 2018-05-25 ENCOUNTER — Ambulatory Visit (INDEPENDENT_AMBULATORY_CARE_PROVIDER_SITE_OTHER): Payer: Medicare HMO

## 2018-05-25 VITALS — HR 88 | Ht 68.5 in | Wt 215.0 lb

## 2018-05-25 DIAGNOSIS — M160 Bilateral primary osteoarthritis of hip: Secondary | ICD-10-CM | POA: Insufficient documentation

## 2018-05-25 DIAGNOSIS — M25551 Pain in right hip: Secondary | ICD-10-CM

## 2018-05-25 MED ORDER — NAPROXEN 500 MG PO TABS: 500.0000 mg | ORAL_TABLET | Freq: Two times a day (BID) | ORAL | 1 refills | Status: DC

## 2018-05-25 NOTE — Progress Notes (Signed)
Office Visit Note   Patient: [name] [name]           Date of Birth: 1950/08/10           MRN: [id number] Visit Date: 05/25/2018              Requested by: Harvest Forest, MD [address] [name], [place] [address] PCP: Harvest Forest, MD   Assessment & Plan: Visit Diagnoses:  1. Right hip pain   2. Bilateral primary osteoarthritis of hip     Plan: We will send in some Naprosyn patient will take 1 twice a day with food.  Gilmer Mor that she can use in her left hand.  I will recheck her in 6 weeks.  We discussed x-rays which shows bone-on-bone changes both hips.  She does have some recurrent spinal stenosis after lumbar surgery done by Dr. Elesa Hacker in the 80s.  She gets relief with sitting but her groin pain is progressed.  Recheck 6 weeks.  Follow-Up Instructions: Return in about 6 weeks (around 07/06/2018).   Orders:  Orders Placed This Encounter  Procedures  . XR HIP UNILAT W OR W/O PELVIS 2-3 VIEWS RIGHT   Meds ordered this encounter  Medications  . naproxen (NAPROSYN) 500 MG tablet    Sig: Take 1 tablet (500 mg total) by mouth 2 (two) times daily with a meal.    Dispense:  60 tablet    Refill:  1      Procedures: No procedures performed   Clinical Data: No additional findings.   Subjective: Chief Complaint  Patient presents with  . Right Hip - Pain  . Lower Back - Follow-up  . 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  She has neurogenic claudication symptoms with recurrent lumbar stenosis but says she gets relief with sitting.  She has had progressive right groin pain that is not responded to some walks a Cam that she borrowed from a friend and took for a week.  She is noticed Trendelenburg gait with prolonged standing or walking.  She denies any groin pain on the left side.  No fever chills no numbness or tingling.  Groin pain on the right radiates down to the mid thigh  and she gets relief with nonweightbearing.  Review of Systems positive for hypertension spinal stenosis.  Resolved impingement right shoulder.  Previous lumbar surgery 1980s.  Otherwise -14 point systems as it pertains HPI.   Objective: Vital Signs: Pulse 88   Ht 5' 8.5" (1.74 m)   Wt 215 lb (97.5 kg)   BMI 32.22 kg/m   Physical Exam Constitutional:      Appearance: She is well-developed.  HENT:     Head: Normocephalic.     Right Ear: External ear normal.     Left Ear: External ear normal.  Eyes:     Pupils: Pupils are equal, round, and reactive to light.  Neck:     Thyroid: No thyromegaly.     Trachea: No tracheal deviation.  Cardiovascular:     Rate and Rhythm: Normal rate.  Pulmonary:     Effort: Pulmonary effort is normal.  Abdominal:     Palpations: Abdomen is soft.  Skin:    General: Skin is warm and dry.  Neurological:     Mental Status: She is alert and oriented to person, place, and time.  Psychiatric:  Behavior: Behavior normal.     Ortho Exam patient has only 5 degrees internal rotation right hip with reproduction of sharp groin pain.  Opposite left hip internally rotates 15 degrees with some groin pain.  Limitation of external rotation at 40 degrees right and left.  No hip flexion contracture distal pulses are intact no sciatic notch tenderness.  Knees reach full extension.  No inguinal lymphadenopathy.  Distal pulses are intact.  Specialty Comments:  No specialty comments available.  Imaging: Xr Hip Unilat W Or W/o Pelvis 2-3 Views Right  Result Date: 05/25/2018 Standing AP pelvis standing frog-leg right lateral hip obtained and reviewed.  This shows bilateral hip osteoarthritis with bone-on-bone changes in the weightbearing portion, flattening of the head marginal osteophytes with subchondral cyst formation. Impression: Bilateral hip osteoarthritis    PMFS History: Patient Active Problem List   Diagnosis Date Noted  . Bilateral primary  osteoarthritis of hip 05/25/2018  . Spinal stenosis of lumbar region with neurogenic claudication 04/20/2018  . Impingement syndrome of right shoulder 04/20/2018  . Essential hypertension, benign 02/20/2016  . Elevated blood pressure reading 09/29/2011  . Chronic back pain 09/29/2011  . Leg edema 09/29/2011  . Obesity 09/29/2011   Past Medical History:  Diagnosis Date  . Allergy   . Anemia   . Arthritis   . Asthma    as child only  . Back pain   . Diabetes mellitus without complication (HCC)   . Hypertension   . Hypothyroidism     Family History  Problem Relation Age of Onset  . Asthma Mother   . Cancer Mother        AML  . Leukemia Mother   . Heart disease Father   . Hypertension Father   . Kidney disease Father   . Breast cancer Maternal Aunt     Past Surgical History:  Procedure Laterality Date  . ABDOMINAL HYSTERECTOMY  1989   fibroid-no longer needs pap  . BACK SURGERY  1987, 1985   DDD  . CARPAL TUNNEL RELEASE Left 05/28/2016   Procedure: CARPAL TUNNEL RELEASE;  Surgeon: Cindee Salt, MD;  Location: [facility];  Service: Orthopedics;  Laterality: Left;  . CARPAL TUNNEL RELEASE Right 03/17/2018   Procedure: CARPAL TUNNEL RELEASE;  Surgeon: Cindee Salt, MD;  Location: [facility];  Service: Orthopedics;  Laterality: Right;  . TUBAL LIGATION     Social History   Occupational History  . Not on file  Tobacco Use  . Smoking status: Never Smoker  . Smokeless tobacco: Never Used  Substance and Sexual Activity  . Alcohol use: Yes    Comment: social  . Drug use: No  . Sexual activity: Not Currently

## 2018-07-05 ENCOUNTER — Telehealth (INDEPENDENT_AMBULATORY_CARE_PROVIDER_SITE_OTHER): Payer: Self-pay | Admitting: Radiology

## 2018-07-05 NOTE — Telephone Encounter (Signed)
I called patient to confirm appointment for tomorrow morning and go over COVID-19 screening questions. Patient wishes to cancel appointment at this time. She will call back to reschedule.

## 2018-07-06 ENCOUNTER — Ambulatory Visit (INDEPENDENT_AMBULATORY_CARE_PROVIDER_SITE_OTHER): Payer: Medicare HMO | Admitting: Orthopaedic Surgery

## 2018-07-19 ENCOUNTER — Other Ambulatory Visit (INDEPENDENT_AMBULATORY_CARE_PROVIDER_SITE_OTHER): Payer: Self-pay | Admitting: Orthopaedic Surgery

## 2018-07-19 NOTE — Telephone Encounter (Signed)
Please advise 

## 2018-08-11 ENCOUNTER — Ambulatory Visit: Payer: Medicaid Other | Admitting: Nutrition

## 2018-08-11 ENCOUNTER — Telehealth: Payer: Self-pay | Admitting: Nutrition

## 2018-08-11 NOTE — Telephone Encounter (Signed)
NO answer for telephone visit. Vm left for pt. To return call to do DM assessment visit over the phone.

## 2018-08-11 NOTE — Telephone Encounter (Signed)
vm to return call.

## 2018-08-25 ENCOUNTER — Telehealth: Payer: Self-pay | Admitting: Orthopaedic Surgery

## 2018-08-25 NOTE — Telephone Encounter (Signed)
Received msg from patient checking on request for records. IC he back,lmvm. Advised records mailed 5/18 and if she wanted the xray cd she need to call back and ask for xray dept.

## 2018-09-19 ENCOUNTER — Telehealth: Payer: Self-pay | Admitting: Orthopaedic Surgery

## 2018-09-19 NOTE — Telephone Encounter (Signed)
Ok to refer to Dr. Ninfa Linden?

## 2018-09-19 NOTE — Telephone Encounter (Signed)
FYI..Please see messages below.  I called patient and made appt for 6/25 at 3pm with Dr. Ninfa Linden.

## 2018-09-19 NOTE — Telephone Encounter (Signed)
Patient called asked if she can be referred to Dr Ninfa Linden for a second opinion for the pain in her hips. The number to contact patient is [phone]

## 2018-09-19 NOTE — Telephone Encounter (Signed)
OK sounds great thanks.

## 2018-09-29 ENCOUNTER — Encounter: Payer: Self-pay | Admitting: Orthopaedic Surgery

## 2018-09-29 ENCOUNTER — Other Ambulatory Visit: Payer: Self-pay

## 2018-09-29 ENCOUNTER — Other Ambulatory Visit: Payer: Self-pay | Admitting: Orthopaedic Surgery

## 2018-09-29 ENCOUNTER — Ambulatory Visit (INDEPENDENT_AMBULATORY_CARE_PROVIDER_SITE_OTHER): Payer: Medicare HMO | Admitting: Orthopaedic Surgery

## 2018-09-29 DIAGNOSIS — M1611 Unilateral primary osteoarthritis, right hip: Secondary | ICD-10-CM | POA: Insufficient documentation

## 2018-09-29 MED ORDER — ACETAMINOPHEN-CODEINE #3 300-30 MG PO TABS: 1.0000 | ORAL_TABLET | Freq: Three times a day (TID) | ORAL | 0 refills | Status: DC | PRN

## 2018-09-29 NOTE — Progress Notes (Signed)
Office Visit Note   Patient: [name] [name]           Date of Birth: 06/27/1950           MRN: [id number] Visit Date: 09/29/2018              Requested by: Harvest ForestBakare, Mobolaji B, MD [address] [place] [place],  [place] [id number] PCP: Harvest ForestBakare, Mobolaji B, MD   Assessment & Plan: Visit Diagnoses:  1. Unilateral primary osteoarthritis, right hip     Plan: I went over her x-rays in detail and showed her hip model.  We talked in length about what hip replacement surgery involves.  We discussed her intraoperative and postoperative course.  We talked about the risk and benefits of surgery as well.  She would like to proceed with the right hip first since that is more symptomatic hip.  All questions concerns were answered and addressed.  I do feel that she has failed conservative treatment for a long period time and this is warranted based on her clinical exam and x-ray findings as well.  I gave her hand about hip replacement surgery as well.  We will work on getting her right hip scheduled for later this year.  Follow-Up Instructions: Return for 2 weeks post-op.   Orders:  No orders of the defined types were placed in this encounter.  No orders of the defined types were placed in this encounter.     Procedures: No procedures performed   Clinical Data: No additional findings.   Subjective: Chief Complaint  Patient presents with  . Right Hip - Pain  . Left Hip - Pain  The patient comes in with known severe end-stage arthritis of both her hips.  Her right hip is much worse in terms of pain in her left hip.  She has been told that she would need hip replacement surgery when she felt comfortable with having the surgery done her body with spine grades time.  Now her body is telling her that it is time for her to have hip replacement surgery.  Her pain is 10 out of 10 and is daily.  Her right hip pain is definitely affecting her mobility, her quality of life and her activities daily  living.  My partner had recommended surgery to her as well.  She wants me to perform this since I had done this on her cousin.  She has no active acute medical issues.  She is not a diabetic and not a smoker.  Her pain again is daily and is been worsening for several years now.  She is tried and failed other forms of conservative treatment including rest, hip strengthening exercises, activity modification, anti-inflammatories as well as injections.  HPI  Review of Systems She currently denies any headache, chest pain, shortness of breath, fever, chills, nausea, vomiting  Objective: Vital Signs: Ht 5' 8.5" (1.74 m)   Wt 217 lb (98.4 kg)   BMI 32.51 kg/m   Physical Exam She is alert and orient x3 and in no acute distress Ortho Exam Examination of both hips show her leg lengths are equal.  She has significant stiffness and pain with internal and external rotation of both hips with the right hurting worse than the left.  She also has pain with flexion and extension. Specialty Comments:  No specialty comments available.  Imaging: No results found. An AP pelvis and lateral both hips on our system were reviewed independently and does show severe arthritis  of both hips.  There is significant narrowing of the superior lateral joint space in the weightbearing surface of the hip.  There is flattening of the femoral head.  There are sclerotic changes as well.  PMFS History: Patient Active Problem List   Diagnosis Date Noted  . Unilateral primary osteoarthritis, right hip 09/29/2018  . Bilateral primary osteoarthritis of hip 05/25/2018  . Spinal stenosis of lumbar region with neurogenic claudication 04/20/2018  . Impingement syndrome of right shoulder 04/20/2018  . Essential hypertension, benign 02/20/2016  . Elevated blood pressure reading 09/29/2011  . Chronic back pain 09/29/2011  . Leg edema 09/29/2011  . Obesity 09/29/2011   Past Medical History:  Diagnosis Date  . Allergy   . Anemia    . Arthritis   . Asthma    as child only  . Back pain   . Diabetes mellitus without complication ([place])   . Hypertension   . Hypothyroidism     Family History  Problem Relation Age of Onset  . Asthma Mother   . Cancer Mother        AML  . Leukemia Mother   . Heart disease Father   . Hypertension Father   . Kidney disease Father   . Breast cancer Maternal Aunt     Past Surgical History:  Procedure Laterality Date  . ABDOMINAL HYSTERECTOMY  1989   fibroid-no longer needs pap  . [place]   DDD  . CARPAL TUNNEL RELEASE Left 05/28/2016   Procedure: CARPAL TUNNEL RELEASE;  Surgeon: Daryll Brod, MD;  Location: [facility];  Service: Orthopedics;  Laterality: Left;  . CARPAL TUNNEL RELEASE Right 03/17/2018   Procedure: CARPAL TUNNEL RELEASE;  Surgeon: Daryll Brod, MD;  Location: [facility];  Service: Orthopedics;  Laterality: Right;  . TUBAL LIGATION     Social History   Occupational History  . Not on file  Tobacco Use  . Smoking status: Never Smoker  . Smokeless tobacco: Never Used  Substance and Sexual Activity  . Alcohol use: Yes    Comment: social  . Drug use: No  . Sexual activity: Not Currently

## 2018-10-19 ENCOUNTER — Other Ambulatory Visit: Payer: Self-pay

## 2018-11-10 ENCOUNTER — Other Ambulatory Visit: Payer: Self-pay | Admitting: Physician Assistant

## 2018-11-14 NOTE — Patient Instructions (Addendum)
YOU NEED TO HAVE A COVID 19 TEST ON 11-15-18  @ 9:50 AM THIS TEST MUST BE DONE BEFORE SURGERY, COME  [address], [place] , [id number]. ONCE YOUR COVID TEST IS COMPLETED, PLEASE BEGIN THE QUARANTINE INSTRUCTIONS AS OUTLINED IN YOUR HANDOUT.                [place]  11/14/2018   Your procedure is scheduled on: 11-18-18     Report to [facility] Main  Entrance    Report to Admitting at 6:00 AM   1 VISITOR IS ALLOWED TO WAIT IN WAITING ROOM  ONLY DAY OF YOUR SURGERY.    Call this number if you have problems the morning of surgery [phone]    Remember:NO SOLID FOOD AFTER MIDNIGHT . NOTHING EXCEPT CLEAR LIQUIDS UNTIL 3 HOURS PRIOR TO SCHEULED SURGERY. PLEASE FINISH ENSURE DRINK PER AT 5:30 AM.   CLEAR LIQUID DIET   Foods Allowed                                                                     Foods Excluded  Coffee and tea, regular and decaf                             liquids that you cannot  Plain Jell-O any favor except red or purple                                           see through such as: Fruit ices (not with fruit pulp)                                     milk, soups, orange juice  Iced Popsicles                                    All solid food Carbonated beverages, regular and diet                                    Cranberry, grape and apple juices Sports drinks like Gatorade Lightly seasoned clear broth or consume(fat free) Sugar, honey syrup  Sample Menu Breakfast                                Lunch                                     Supper Cranberry juice                    Beef broth                            Chicken broth  Jell-O                                     Grape juice                           Apple juice Coffee or tea                        Jell-O                                      Popsicle                                                Coffee or tea                        Coffee or  tea  _____________________________________________________________________      Take these medicines the morning of surgery with A SIP OF WATER: Levothyroxine (Synthroid)  BRUSH YOUR TEETH MORNING OF SURGERY AND RINSE YOUR MOUTH OUT, NO CHEWING GUM CANDY OR MINTS.   DO NOT TAKE ANY DIABETIC MEDICATIONS DAY OF YOUR SURGERY                               You may not have any metal on your body including hair pins and              piercings     Do not wear jewelry, make-up, lotions, powders or perfumes, deodorant              Do not wear nail polish.  Do not shave  48 hours prior to surgery.                Do not bring valuables to the hospital. [facility] IS NOT             RESPONSIBLE   FOR VALUABLES.  Contacts, dentures or bridgework may not be worn into surgery.  Leave suitcase in the car. After surgery it may be brought to your room.       Special Instructions: N/A              Please read over the following fact sheets you were given: _____________________________________________________________________  How to Manage Your Diabetes Before and After Surgery  Why is it important to control my blood sugar before and after surgery? . Improving blood sugar levels before and after surgery helps healing and can limit problems. . A way of improving blood sugar control is eating a healthy diet by: o  Eating less sugar and carbohydrates o  Increasing activity/exercise o  Talking with your doctor about reaching your blood sugar goals . High blood sugars (greater than 180 mg/dL) can raise your risk of infections and slow your recovery, so you will need to focus on controlling your diabetes during the weeks before surgery. . Make sure that the doctor who takes care of your diabetes knows about your planned surgery including the date and location.  How do I manage my blood sugar  before surgery? . Check your blood sugar at least 4 times a day, starting 2 days before surgery, to make  sure that the level is not too high or low. o Check your blood sugar the morning of your surgery when you wake up and every 2 hours until you get to the Short Stay unit. . If your blood sugar is less than 70 mg/dL, you will need to treat for low blood sugar: o Do not take insulin. o Treat a low blood sugar (less than 70 mg/dL) with  cup of clear juice (cranberry or apple), 4 glucose tablets, OR glucose gel. o Recheck blood sugar in 15 minutes after treatment (to make sure it is greater than 70 mg/dL). If your blood sugar is not greater than 70 mg/dL on recheck, call [phone] for further instructions. . Report your blood sugar to the short stay nurse when you get to Short Stay.  . If you are admitted to the hospital after surgery: o Your blood sugar will be checked by the staff and you will probably be given insulin after surgery (instead of oral diabetes medicines) to make sure you have good blood sugar levels. o The goal for blood sugar control after surgery is 80-180 mg/dL.   WHAT DO I DO ABOUT MY DIABETES MEDICATION?  THE DAY BEFORE SURGERY, take your usual dose of Metformin      . Do not take oral diabetes medicines (pills) the morning of surgery. .       Reviewed and Endorsed by [facility] Patient Education Committee, August 2015           [facility] - Preparing for Surgery Before surgery, you can play an important role.  Because skin is not sterile, your skin needs to be as free of germs as possible.  You can reduce the number of germs on your skin by washing with CHG (chlorahexidine gluconate) soap before surgery.  CHG is an antiseptic cleaner which kills germs and bonds with the skin to continue killing germs even after washing. Please DO NOT use if you have an allergy to CHG or antibacterial soaps.  If your skin becomes reddened/irritated stop using the CHG and inform your nurse when you arrive at Short Stay. Do not shave (including legs and underarms) for at least 48  hours prior to the first CHG shower.  You may shave your face/neck. Please follow these instructions carefully:  1.  Shower with CHG Soap the night before surgery and the  morning of Surgery.  2.  If you choose to wash your hair, wash your hair first as usual with your  normal  shampoo.  3.  After you shampoo, rinse your hair and body thoroughly to remove the  shampoo.                           4.  Use CHG as you would any other liquid soap.  You can apply chg directly  to the skin and wash                       Gently with a scrungie or clean washcloth.  5.  Apply the CHG Soap to your body ONLY FROM THE NECK DOWN.   Do not use on face/ open                           Wound or  open sores. Avoid contact with eyes, ears mouth and genitals (private parts).                       Wash face,  Genitals (private parts) with your normal soap.             6.  Wash thoroughly, paying special attention to the area where your surgery  will be performed.  7.  Thoroughly rinse your body with warm water from the neck down.  8.  DO NOT shower/wash with your normal soap after using and rinsing off  the CHG Soap.                9.  Pat yourself dry with a clean towel.            10.  Wear clean pajamas.            11.  Place clean sheets on your bed the night of your first shower and do not  sleep with pets. Day of Surgery : Do not apply any lotions/deodorants the morning of surgery.  Please wear clean clothes to the hospital/surgery center.  FAILURE TO FOLLOW THESE INSTRUCTIONS MAY RESULT IN THE CANCELLATION OF YOUR SURGERY PATIENT SIGNATURE_________________________________  NURSE SIGNATURE__________________________________  ________________________________________________________________________   [name] [name]  An incentive spirometer is a tool that can help keep your lungs clear and active. This tool measures how well you are filling your lungs with each breath. Taking long deep breaths may help  reverse or decrease the chance of developing breathing (pulmonary) problems (especially infection) following:  A long period of time when you are unable to move or be active. BEFORE THE PROCEDURE   If the spirometer includes an indicator to show your best effort, your nurse or respiratory therapist will set it to a desired goal.  If possible, sit up straight or lean slightly forward. Try not to slouch.  Hold the incentive spirometer in an upright position. INSTRUCTIONS FOR USE  1. Sit on the edge of your bed if possible, or sit up as far as you can in bed or on a chair. 2. Hold the incentive spirometer in an upright position. 3. Breathe out normally. 4. Place the mouthpiece in your mouth and seal your lips tightly around it. 5. Breathe in slowly and as deeply as possible, raising the piston or the ball toward the top of the column. 6. Hold your breath for 3-5 seconds or for as long as possible. Allow the piston or ball to fall to the bottom of the column. 7. Remove the mouthpiece from your mouth and breathe out normally. 8. Rest for a few seconds and repeat Steps 1 through 7 at least 10 times every 1-2 hours when you are awake. Take your time and take a few normal breaths between deep breaths. 9. The spirometer may include an indicator to show your best effort. Use the indicator as a goal to work toward during each repetition. 10. After each set of 10 deep breaths, practice coughing to be sure your lungs are clear. If you have an incision (the cut made at the time of surgery), support your incision when coughing by placing a pillow or rolled up towels firmly against it. Once you are able to get out of bed, walk around indoors and cough well. You may stop using the incentive spirometer when instructed by your caregiver.  RISKS AND COMPLICATIONS  Take your time so you do not get dizzy or  light-headed.  If you are in pain, you may need to take or ask for pain medication before doing incentive  spirometry. It is harder to take a deep breath if you are having pain. AFTER USE  Rest and breathe slowly and easily.  It can be helpful to keep track of a log of your progress. Your caregiver can provide you with a simple table to help with this. If you are using the spirometer at home, follow these instructions: SEEK MEDICAL CARE IF:   You are having difficultly using the spirometer.  You have trouble using the spirometer as often as instructed.  Your pain medication is not giving enough relief while using the spirometer.  You develop fever of 100.5 F (38.1 C) or higher. SEEK IMMEDIATE MEDICAL CARE IF:   You cough up bloody sputum that had not been present before.  You develop fever of 102 F (38.9 C) or greater.  You develop worsening pain at or near the incision site. MAKE SURE YOU:   Understand these instructions.  Will watch your condition.  Will get help right away if you are not doing well or get worse. Document Released: 08/03/2006 Document Revised: 06/15/2011 Document Reviewed: 10/04/2006 [facility] Patient Information 2014 [employer] [place] [place].   ________________________________________________________________________

## 2018-11-15 ENCOUNTER — Other Ambulatory Visit (HOSPITAL_COMMUNITY)
Admission: RE | Admit: 2018-11-15 | Discharge: 2018-11-15 | Disposition: A | Discharge status: Home / Self Care | Payer: Medicare HMO | Source: Ambulatory Visit | Attending: Orthopaedic Surgery | Admitting: Orthopaedic Surgery

## 2018-11-15 ENCOUNTER — Encounter (HOSPITAL_COMMUNITY): Payer: Self-pay

## 2018-11-15 ENCOUNTER — Other Ambulatory Visit: Payer: Self-pay

## 2018-11-15 ENCOUNTER — Encounter (HOSPITAL_COMMUNITY)
Admission: RE | Admit: 2018-11-15 | Discharge: 2018-11-15 | Disposition: A | Discharge status: Home / Self Care | Payer: Medicare HMO | Source: Ambulatory Visit | Attending: Orthopaedic Surgery | Admitting: Orthopaedic Surgery

## 2018-11-15 DIAGNOSIS — D62 Acute posthemorrhagic anemia: Secondary | ICD-10-CM | POA: Diagnosis not present

## 2018-11-15 DIAGNOSIS — M1611 Unilateral primary osteoarthritis, right hip: Secondary | ICD-10-CM | POA: Insufficient documentation

## 2018-11-15 DIAGNOSIS — I10 Essential (primary) hypertension: Secondary | ICD-10-CM | POA: Diagnosis not present

## 2018-11-15 DIAGNOSIS — Z20828 Contact with and (suspected) exposure to other viral communicable diseases: Secondary | ICD-10-CM | POA: Insufficient documentation

## 2018-11-15 DIAGNOSIS — Z881 Allergy status to other antibiotic agents status: Secondary | ICD-10-CM | POA: Diagnosis not present

## 2018-11-15 DIAGNOSIS — E039 Hypothyroidism, unspecified: Secondary | ICD-10-CM | POA: Diagnosis not present

## 2018-11-15 DIAGNOSIS — Z6832 Body mass index (BMI) 32.0-32.9, adult: Secondary | ICD-10-CM | POA: Diagnosis not present

## 2018-11-15 DIAGNOSIS — E119 Type 2 diabetes mellitus without complications: Secondary | ICD-10-CM | POA: Diagnosis not present

## 2018-11-15 DIAGNOSIS — E669 Obesity, unspecified: Secondary | ICD-10-CM | POA: Diagnosis not present

## 2018-11-15 DIAGNOSIS — Z01812 Encounter for preprocedural laboratory examination: Secondary | ICD-10-CM | POA: Insufficient documentation

## 2018-11-15 DIAGNOSIS — M160 Bilateral primary osteoarthritis of hip: Secondary | ICD-10-CM | POA: Diagnosis present

## 2018-11-15 LAB — BASIC METABOLIC PANEL
Anion gap: 9 (ref 5–15)
BUN: 18 mg/dL (ref 8–23)
CO2: 25 mmol/L (ref 22–32)
Calcium: 9.5 mg/dL (ref 8.9–10.3)
Chloride: 107 mmol/L (ref 98–111)
Creatinine, Ser: 0.84 mg/dL (ref 0.44–1.00)
GFR calc Af Amer: >60 mL/min (ref >60)
GFR calc non Af Amer: >60 mL/min (ref >60)
Glucose, Bld: 114 mg/dL — ABNORMAL HIGH (ref 70–99)
Potassium: 3.7 mmol/L (ref 3.5–5.1)
Sodium: 141 mmol/L (ref 135–145)

## 2018-11-15 LAB — CBC
HCT: 38.2 % (ref 36.0–46.0)
Hemoglobin: 11.8 g/dL — ABNORMAL LOW (ref 12.0–15.0)
MCH: 27.6 pg (ref 26.0–34.0)
MCHC: 30.9 g/dL (ref 30.0–36.0)
MCV: 89.5 fL (ref 80.0–100.0)
Platelets: 378 10*3/uL (ref 150–400)
RBC: 4.27 MIL/uL (ref 3.87–5.11)
RDW: 14.4 % (ref 11.5–15.5)
WBC: 11 10*3/uL — ABNORMAL HIGH (ref 4.0–10.5)
nRBC: 0 % (ref 0.0–0.2)

## 2018-11-15 LAB — SARS CORONAVIRUS 2 (TAT 6-24 HRS): SARS Coronavirus 2: NEGATIVE

## 2018-11-15 LAB — SURGICAL PCR SCREEN
MRSA, PCR: NEGATIVE
Staphylococcus aureus: NEGATIVE

## 2018-11-15 LAB — HEMOGLOBIN A1C
Hgb A1c MFr Bld: 6.5 % — ABNORMAL HIGH (ref 4.8–5.6)
Mean Plasma Glucose: 139.85 mg/dL

## 2018-11-15 LAB — GLUCOSE, CAPILLARY: Glucose-Capillary: 107 mg/dL — ABNORMAL HIGH (ref 70–99)

## 2018-11-18 ENCOUNTER — Observation Stay (HOSPITAL_COMMUNITY)
Admission: RE | Admit: 2018-11-18 | Discharge: 2018-11-19 | Disposition: A | Discharge status: Home Health | Payer: Medicare HMO | Attending: Orthopaedic Surgery | Admitting: Orthopaedic Surgery

## 2018-11-18 ENCOUNTER — Ambulatory Visit (HOSPITAL_COMMUNITY): Payer: Medicare HMO | Admitting: Physician Assistant

## 2018-11-18 ENCOUNTER — Encounter (HOSPITAL_COMMUNITY): Payer: Self-pay | Admitting: *Deleted

## 2018-11-18 ENCOUNTER — Ambulatory Visit (HOSPITAL_COMMUNITY): Payer: Medicare HMO | Admitting: Certified Registered Nurse Anesthetist

## 2018-11-18 ENCOUNTER — Other Ambulatory Visit: Payer: Self-pay

## 2018-11-18 ENCOUNTER — Ambulatory Visit (HOSPITAL_COMMUNITY): Payer: Medicare HMO

## 2018-11-18 ENCOUNTER — Observation Stay (HOSPITAL_COMMUNITY): Payer: Medicare HMO

## 2018-11-18 ENCOUNTER — Encounter (HOSPITAL_COMMUNITY)
Admission: RE | Disposition: A | Discharge status: Home Health | Payer: Self-pay | Source: Home / Self Care | Attending: Orthopaedic Surgery

## 2018-11-18 DIAGNOSIS — I10 Essential (primary) hypertension: Secondary | ICD-10-CM | POA: Diagnosis not present

## 2018-11-18 DIAGNOSIS — Z419 Encounter for procedure for purposes other than remedying health state, unspecified: Secondary | ICD-10-CM

## 2018-11-18 DIAGNOSIS — Z96641 Presence of right artificial hip joint: Secondary | ICD-10-CM

## 2018-11-18 DIAGNOSIS — Z881 Allergy status to other antibiotic agents status: Secondary | ICD-10-CM | POA: Insufficient documentation

## 2018-11-18 DIAGNOSIS — M160 Bilateral primary osteoarthritis of hip: Principal | ICD-10-CM | POA: Insufficient documentation

## 2018-11-18 DIAGNOSIS — Z6832 Body mass index (BMI) 32.0-32.9, adult: Secondary | ICD-10-CM | POA: Insufficient documentation

## 2018-11-18 DIAGNOSIS — E119 Type 2 diabetes mellitus without complications: Secondary | ICD-10-CM | POA: Diagnosis not present

## 2018-11-18 DIAGNOSIS — D62 Acute posthemorrhagic anemia: Secondary | ICD-10-CM | POA: Insufficient documentation

## 2018-11-18 DIAGNOSIS — M1611 Unilateral primary osteoarthritis, right hip: Secondary | ICD-10-CM | POA: Diagnosis present

## 2018-11-18 DIAGNOSIS — E669 Obesity, unspecified: Secondary | ICD-10-CM | POA: Insufficient documentation

## 2018-11-18 DIAGNOSIS — E039 Hypothyroidism, unspecified: Secondary | ICD-10-CM | POA: Insufficient documentation

## 2018-11-18 DIAGNOSIS — Z20828 Contact with and (suspected) exposure to other viral communicable diseases: Secondary | ICD-10-CM | POA: Insufficient documentation

## 2018-11-18 HISTORY — PX: TOTAL HIP ARTHROPLASTY: SHX124

## 2018-11-18 LAB — GLUCOSE, CAPILLARY
Glucose-Capillary: 121 mg/dL — ABNORMAL HIGH (ref 70–99)
Glucose-Capillary: 135 mg/dL — ABNORMAL HIGH (ref 70–99)
Glucose-Capillary: 132 mg/dL — ABNORMAL HIGH (ref 70–99)

## 2018-11-18 SURGERY — ARTHROPLASTY, HIP, TOTAL, ANTERIOR APPROACH
Anesthesia: Spinal | Laterality: Right

## 2018-11-18 MED ORDER — ATORVASTATIN CALCIUM 20 MG PO TABS
20.0000 mg | ORAL_TABLET | Freq: Every day | ORAL | Status: DC
  Administered 2018-11-18 – 2018-11-19 (×2): 20 mg via ORAL
  Filled 2018-11-18 (×2): qty 1

## 2018-11-18 MED ORDER — FENTANYL CITRATE (PF) 100 MCG/2ML IJ SOLN
INTRAMUSCULAR | Status: AC
  Filled 2018-11-18: qty 2

## 2018-11-18 MED ORDER — METOCLOPRAMIDE HCL 5 MG/ML IJ SOLN: 5.0000 mg | Freq: Three times a day (TID) | INTRAMUSCULAR | Status: DC | PRN

## 2018-11-18 MED ORDER — METOCLOPRAMIDE HCL 5 MG PO TABS: 5.0000 mg | ORAL_TABLET | Freq: Three times a day (TID) | ORAL | Status: DC | PRN

## 2018-11-18 MED ORDER — METHOCARBAMOL 500 MG PO TABS
500.0000 mg | ORAL_TABLET | Freq: Four times a day (QID) | ORAL | Status: DC | PRN
  Administered 2018-11-18 – 2018-11-19 (×4): 500 mg via ORAL
  Filled 2018-11-18 (×4): qty 1

## 2018-11-18 MED ORDER — LEVOTHYROXINE SODIUM 25 MCG PO TABS
25.0000 ug | ORAL_TABLET | Freq: Every day | ORAL | Status: DC
  Administered 2018-11-19: 25 ug via ORAL
  Filled 2018-11-18: qty 1

## 2018-11-18 MED ORDER — HYDROMORPHONE HCL 1 MG/ML IJ SOLN: 0.5000 mg | INTRAMUSCULAR | Status: DC | PRN

## 2018-11-18 MED ORDER — PANTOPRAZOLE SODIUM 40 MG PO TBEC
40.0000 mg | DELAYED_RELEASE_TABLET | Freq: Every day | ORAL | Status: DC
  Administered 2018-11-18 – 2018-11-19 (×2): 40 mg via ORAL
  Filled 2018-11-18 (×2): qty 1

## 2018-11-18 MED ORDER — CEFAZOLIN SODIUM-DEXTROSE 1-4 GM/50ML-% IV SOLN
1.0000 g | Freq: Four times a day (QID) | INTRAVENOUS | Status: AC
  Administered 2018-11-18 (×2): 1 g via INTRAVENOUS
  Filled 2018-11-18 (×2): qty 50

## 2018-11-18 MED ORDER — FENTANYL CITRATE (PF) 100 MCG/2ML IJ SOLN
INTRAMUSCULAR | Status: DC | PRN
  Administered 2018-11-18: 100 ug via INTRAVENOUS

## 2018-11-18 MED ORDER — SODIUM CHLORIDE 0.9 % IV SOLN
INTRAVENOUS | Status: DC
  Administered 2018-11-18 – 2018-11-19 (×2): via INTRAVENOUS

## 2018-11-18 MED ORDER — PHENYLEPHRINE 40 MCG/ML (10ML) SYRINGE FOR IV PUSH (FOR BLOOD PRESSURE SUPPORT)
PREFILLED_SYRINGE | INTRAVENOUS | Status: DC | PRN
  Administered 2018-11-18 (×6): 80 ug via INTRAVENOUS

## 2018-11-18 MED ORDER — OXYCODONE HCL 5 MG PO TABS
10.0000 mg | ORAL_TABLET | ORAL | Status: DC | PRN
  Administered 2018-11-18: 23:00:00 10 mg via ORAL
  Administered 2018-11-19 (×2): 15 mg via ORAL
  Filled 2018-11-18: qty 3
  Filled 2018-11-18: qty 2
  Filled 2018-11-18: qty 3

## 2018-11-18 MED ORDER — POLYETHYLENE GLYCOL 3350 17 G PO PACK: 17.0000 g | PACK | Freq: Every day | ORAL | Status: DC | PRN

## 2018-11-18 MED ORDER — PROPOFOL 10 MG/ML IV BOLUS
INTRAVENOUS | Status: DC | PRN
  Administered 2018-11-18: 20 mg via INTRAVENOUS

## 2018-11-18 MED ORDER — INSULIN ASPART 100 UNIT/ML ~~LOC~~ SOLN: 0.0000 [IU] | Freq: Three times a day (TID) | SUBCUTANEOUS | Status: DC

## 2018-11-18 MED ORDER — ONDANSETRON HCL 4 MG/2ML IJ SOLN: 4.0000 mg | Freq: Four times a day (QID) | INTRAMUSCULAR | Status: DC | PRN

## 2018-11-18 MED ORDER — INSULIN ASPART 100 UNIT/ML ~~LOC~~ SOLN: 0.0000 [IU] | Freq: Every day | SUBCUTANEOUS | Status: DC

## 2018-11-18 MED ORDER — GABAPENTIN 100 MG PO CAPS
100.0000 mg | ORAL_CAPSULE | Freq: Three times a day (TID) | ORAL | Status: DC
  Administered 2018-11-18 – 2018-11-19 (×3): 100 mg via ORAL
  Filled 2018-11-18 (×3): qty 1

## 2018-11-18 MED ORDER — POVIDONE-IODINE 10 % EX SWAB
2.0000 "application " | Freq: Once | CUTANEOUS | Status: AC
  Administered 2018-11-18: 2 via TOPICAL

## 2018-11-18 MED ORDER — ONDANSETRON HCL 4 MG/2ML IJ SOLN
INTRAMUSCULAR | Status: DC | PRN
  Administered 2018-11-18: 4 mg via INTRAVENOUS

## 2018-11-18 MED ORDER — CHLORHEXIDINE GLUCONATE 4 % EX LIQD: 60.0000 mL | Freq: Once | CUTANEOUS | Status: DC

## 2018-11-18 MED ORDER — SODIUM CHLORIDE 0.9 % IR SOLN
Status: DC | PRN
  Administered 2018-11-18 (×2): 1000 mL

## 2018-11-18 MED ORDER — ONDANSETRON HCL 4 MG PO TABS: 4.0000 mg | ORAL_TABLET | Freq: Four times a day (QID) | ORAL | Status: DC | PRN

## 2018-11-18 MED ORDER — PROPOFOL 500 MG/50ML IV EMUL
INTRAVENOUS | Status: DC | PRN
  Administered 2018-11-18: 100 ug/kg/min via INTRAVENOUS

## 2018-11-18 MED ORDER — BUPIVACAINE IN DEXTROSE 0.75-8.25 % IT SOLN
INTRATHECAL | Status: DC | PRN
  Administered 2018-11-18: 1.6 mL via INTRATHECAL

## 2018-11-18 MED ORDER — SODIUM CHLORIDE 0.9 % IV SOLN
INTRAVENOUS | Status: DC | PRN
  Administered 2018-11-18: 10:00:00 75 ug/min via INTRAVENOUS

## 2018-11-18 MED ORDER — OXYCODONE HCL 5 MG PO TABS
5.0000 mg | ORAL_TABLET | ORAL | Status: DC | PRN
  Administered 2018-11-18: 10 mg via ORAL
  Administered 2018-11-18: 5 mg via ORAL
  Administered 2018-11-19: 10 mg via ORAL
  Filled 2018-11-18 (×2): qty 2
  Filled 2018-11-18: qty 1

## 2018-11-18 MED ORDER — FENTANYL CITRATE (PF) 100 MCG/2ML IJ SOLN: 25.0000 ug | INTRAMUSCULAR | Status: DC | PRN

## 2018-11-18 MED ORDER — TRANEXAMIC ACID-NACL 1000-0.7 MG/100ML-% IV SOLN
1000.0000 mg | INTRAVENOUS | Status: AC
  Administered 2018-11-18: 09:00:00 1000 mg via INTRAVENOUS
  Filled 2018-11-18: qty 100

## 2018-11-18 MED ORDER — MENTHOL 3 MG MT LOZG: 1.0000 | LOZENGE | OROMUCOSAL | Status: DC | PRN

## 2018-11-18 MED ORDER — ASPIRIN 81 MG PO CHEW
81.0000 mg | CHEWABLE_TABLET | Freq: Two times a day (BID) | ORAL | Status: DC
  Administered 2018-11-18 – 2018-11-19 (×2): 81 mg via ORAL
  Filled 2018-11-18 (×2): qty 1

## 2018-11-18 MED ORDER — OXYCODONE HCL 5 MG PO TABS: 5.0000 mg | ORAL_TABLET | Freq: Once | ORAL | Status: DC | PRN

## 2018-11-18 MED ORDER — AMLODIPINE BESYLATE 5 MG PO TABS
5.0000 mg | ORAL_TABLET | Freq: Every day | ORAL | Status: DC
  Administered 2018-11-18 – 2018-11-19 (×2): 5 mg via ORAL
  Filled 2018-11-18 (×2): qty 1

## 2018-11-18 MED ORDER — OXYCODONE HCL 5 MG/5ML PO SOLN: 5.0000 mg | Freq: Once | ORAL | Status: DC | PRN

## 2018-11-18 MED ORDER — DIPHENHYDRAMINE HCL 12.5 MG/5ML PO ELIX: 12.5000 mg | ORAL_SOLUTION | ORAL | Status: DC | PRN

## 2018-11-18 MED ORDER — METHOCARBAMOL 500 MG IVPB - SIMPLE MED
500.0000 mg | Freq: Four times a day (QID) | INTRAVENOUS | Status: DC | PRN
  Filled 2018-11-18: qty 50

## 2018-11-18 MED ORDER — DEXAMETHASONE SODIUM PHOSPHATE 10 MG/ML IJ SOLN
INTRAMUSCULAR | Status: DC | PRN
  Administered 2018-11-18: 4 mg via INTRAVENOUS

## 2018-11-18 MED ORDER — METFORMIN HCL 500 MG PO TABS
500.0000 mg | ORAL_TABLET | Freq: Two times a day (BID) | ORAL | Status: DC
  Administered 2018-11-18 – 2018-11-19 (×2): 500 mg via ORAL
  Filled 2018-11-18 (×2): qty 1

## 2018-11-18 MED ORDER — LACTATED RINGERS IV SOLN
INTRAVENOUS | Status: DC
  Administered 2018-11-18 (×2): via INTRAVENOUS

## 2018-11-18 MED ORDER — PROPOFOL 10 MG/ML IV BOLUS
INTRAVENOUS | Status: AC
  Filled 2018-11-18: qty 60

## 2018-11-18 MED ORDER — CEFAZOLIN SODIUM-DEXTROSE 2-4 GM/100ML-% IV SOLN
2.0000 g | INTRAVENOUS | Status: AC
  Administered 2018-11-18: 2 g via INTRAVENOUS
  Filled 2018-11-18: qty 100

## 2018-11-18 MED ORDER — DOCUSATE SODIUM 100 MG PO CAPS
100.0000 mg | ORAL_CAPSULE | Freq: Two times a day (BID) | ORAL | Status: DC
  Administered 2018-11-18 – 2018-11-19 (×3): 100 mg via ORAL
  Filled 2018-11-18 (×3): qty 1

## 2018-11-18 MED ORDER — MIDAZOLAM HCL 2 MG/2ML IJ SOLN
INTRAMUSCULAR | Status: AC
  Filled 2018-11-18: qty 2

## 2018-11-18 MED ORDER — ALUM & MAG HYDROXIDE-SIMETH 200-200-20 MG/5ML PO SUSP: 30.0000 mL | ORAL | Status: DC | PRN

## 2018-11-18 MED ORDER — MIDAZOLAM HCL 5 MG/5ML IJ SOLN
INTRAMUSCULAR | Status: DC | PRN
  Administered 2018-11-18: 2 mg via INTRAVENOUS

## 2018-11-18 MED ORDER — ACETAMINOPHEN 325 MG PO TABS: 325.0000 mg | ORAL_TABLET | Freq: Four times a day (QID) | ORAL | Status: DC | PRN

## 2018-11-18 MED ORDER — PHENOL 1.4 % MT LIQD
1.0000 | OROMUCOSAL | Status: DC | PRN
  Filled 2018-11-18: qty 177

## 2018-11-18 MED ORDER — ZOLPIDEM TARTRATE 5 MG PO TABS: 5.0000 mg | ORAL_TABLET | Freq: Every evening | ORAL | Status: DC | PRN

## 2018-11-18 MED ORDER — LIDOCAINE HCL (CARDIAC) PF 100 MG/5ML IV SOSY
PREFILLED_SYRINGE | INTRAVENOUS | Status: DC | PRN
  Administered 2018-11-18: 60 mg via INTRAVENOUS

## 2018-11-18 SURGICAL SUPPLY — 42 items
ARTICULEZE HEAD (Hips) ×2 IMPLANT
BAG ZIPLOCK 12X15 (MISCELLANEOUS) IMPLANT
BENZOIN TINCTURE PRP APPL 2/3 (GAUZE/BANDAGES/DRESSINGS) IMPLANT
BLADE SAW SGTL 18X1.27X75 (BLADE) ×2 IMPLANT
BLADE SURG SZ10 CARB STEEL (BLADE) ×4 IMPLANT
COVER PERINEAL POST (MISCELLANEOUS) ×2 IMPLANT
COVER SURGICAL LIGHT HANDLE (MISCELLANEOUS) ×2 IMPLANT
COVER WAND RF STERILE (DRAPES) IMPLANT
DRAPE STERI IOBAN 125X83 (DRAPES) ×2 IMPLANT
DRAPE U-SHAPE 47X51 STRL (DRAPES) ×4 IMPLANT
DRESSING AQUACEL AG SP 3.5X10 (GAUZE/BANDAGES/DRESSINGS) IMPLANT
DRSG AQUACEL AG ADV 3.5X10 (GAUZE/BANDAGES/DRESSINGS) ×2 IMPLANT
DRSG AQUACEL AG SP 3.5X10 (GAUZE/BANDAGES/DRESSINGS) ×2
DURAPREP 26ML APPLICATOR (WOUND CARE) ×2 IMPLANT
ELECT REM PT RETURN 15FT ADLT (MISCELLANEOUS) ×2 IMPLANT
GAUZE XEROFORM 1X8 LF (GAUZE/BANDAGES/DRESSINGS) ×2 IMPLANT
GLOVE BIO SURGEON STRL SZ7.5 (GLOVE) ×2 IMPLANT
GLOVE BIOGEL PI IND STRL 8 (GLOVE) ×2 IMPLANT
GLOVE BIOGEL PI INDICATOR 8 (GLOVE) ×2
GLOVE ECLIPSE 8.0 STRL XLNG CF (GLOVE) ×2 IMPLANT
GOWN STRL REUS W/TWL XL LVL3 (GOWN DISPOSABLE) ×4 IMPLANT
HANDPIECE INTERPULSE COAX TIP (DISPOSABLE) ×1
HEAD ARTICULEZE (Hips) IMPLANT
HOLDER FOLEY CATH W/STRAP (MISCELLANEOUS) ×2 IMPLANT
KIT TURNOVER KIT A (KITS) IMPLANT
LINER ACETAB NEUTRAL 36ID 520D (Liner) ×1 IMPLANT
PACK ANTERIOR HIP CUSTOM (KITS) ×2 IMPLANT
PIN SECTOR W/GRIP ACE CUP 52MM (Hips) ×1 IMPLANT
SET HNDPC FAN SPRY TIP SCT (DISPOSABLE) ×1 IMPLANT
STAPLER VISISTAT 35W (STAPLE) IMPLANT
STEM FEM ACTIS HIGH SZ3 (Stem) ×1 IMPLANT
STRIP CLOSURE SKIN 1/2X4 (GAUZE/BANDAGES/DRESSINGS) IMPLANT
SUT ETHIBOND NAB CT1 #1 30IN (SUTURE) ×2 IMPLANT
SUT ETHILON 2 0 PS N (SUTURE) IMPLANT
SUT MNCRL AB 4-0 PS2 18 (SUTURE) IMPLANT
SUT VIC AB 0 CT1 36 (SUTURE) ×2 IMPLANT
SUT VIC AB 1 CT1 36 (SUTURE) ×2 IMPLANT
SUT VIC AB 2-0 CT1 27 (SUTURE) ×2
SUT VIC AB 2-0 CT1 TAPERPNT 27 (SUTURE) ×2 IMPLANT
TRAY CATH 16FR W/PLASTIC CATH (SET/KITS/TRAYS/PACK) ×1 IMPLANT
TRAY FOLEY MTR SLVR 16FR STAT (SET/KITS/TRAYS/PACK) ×1 IMPLANT
YANKAUER SUCT BULB TIP 10FT TU (MISCELLANEOUS) ×2 IMPLANT

## 2018-11-18 NOTE — Anesthesia Postprocedure Evaluation (Signed)
Anesthesia Post Note  Patient: [name] [name]  Procedure(s) Performed: RIGHT TOTAL HIP ARTHROPLASTY ANTERIOR APPROACH (Right )     Patient location during evaluation: PACU Anesthesia Type: Spinal and Regional Level of consciousness: oriented and awake and alert Pain management: pain level controlled Vital Signs Assessment: post-procedure vital signs reviewed and stable Respiratory status: spontaneous breathing, respiratory function stable and patient connected to nasal cannula oxygen Cardiovascular status: blood pressure returned to baseline and stable Postop Assessment: no headache, no backache and no apparent nausea or vomiting Anesthetic complications: no    Last Vitals:  Vitals:   11/18/18 1230 11/18/18 1343  BP: 136/83 115/67  Pulse: 79 83  Resp: (!) 21 14  Temp: 36.5 C   SpO2: 97% 100%    Last Pain:  Vitals:   11/18/18 1230  TempSrc:   PainSc: 0-No pain                 Inetha Maret S

## 2018-11-18 NOTE — H&P (Signed)
TOTAL HIP ADMISSION H&P  Patient is admitted for right total hip arthroplasty.  Subjective:  Chief Complaint: right hip pain  HPI: [name] [name], 68 y.o. female, has a history of pain and functional disability in the right hip(s) due to arthritis and patient has failed non-surgical conservative treatments for greater than 12 weeks to include NSAID's and/or analgesics, corticosteriod injections, flexibility and strengthening excercises, use of assistive devices, weight reduction as appropriate and activity modification.  Onset of symptoms was gradual starting 3 years ago with gradually worsening course since that time.The patient noted no past surgery on the right hip(s).  Patient currently rates pain in the right hip at 10 out of 10 with activity. Patient has night pain, worsening of pain with activity and weight bearing, trendelenberg gait, pain that interfers with activities of daily living, pain with passive range of motion and crepitus. Patient has evidence of subchondral cysts, subchondral sclerosis, periarticular osteophytes and joint space narrowing by imaging studies. This condition presents safety issues increasing the risk of falls.  There is no current active infection.  Patient Active Problem List   Diagnosis Date Noted  . Unilateral primary osteoarthritis, right hip 09/29/2018  . Bilateral primary osteoarthritis of hip 05/25/2018  . Spinal stenosis of lumbar region with neurogenic claudication 04/20/2018  . Impingement syndrome of right shoulder 04/20/2018  . Essential hypertension, benign 02/20/2016  . Elevated blood pressure reading 09/29/2011  . Chronic back pain 09/29/2011  . Leg edema 09/29/2011  . Obesity 09/29/2011   Past Medical History:  Diagnosis Date  . Allergy   . Anemia   . Arthritis   . Asthma    as child only  . Back pain   . Diabetes mellitus without complication ([place])   . Hypertension   . Hypothyroidism     Past Surgical History:  Procedure  Laterality Date  . ABDOMINAL HYSTERECTOMY  1989   fibroid-no longer needs pap  . [place]   DDD  . CARPAL TUNNEL RELEASE Left 05/28/2016   Procedure: CARPAL TUNNEL RELEASE;  Surgeon: Daryll Brod, MD;  Location: [facility];  Service: Orthopedics;  Laterality: Left;  . CARPAL TUNNEL RELEASE Right 03/17/2018   Procedure: CARPAL TUNNEL RELEASE;  Surgeon: Daryll Brod, MD;  Location: [facility];  Service: Orthopedics;  Laterality: Right;  . TUBAL LIGATION      Current Facility-Administered Medications  Medication Dose Route Frequency Provider Last Rate Last Dose  . ceFAZolin (ANCEF) IVPB 2g/100 mL premix  2 g Intravenous On Call to OR Pete Pelt, PA-C      . chlorhexidine (HIBICLENS) 4 % liquid 4 application  60 mL Topical Once Pete Pelt, PA-C      . lactated ringers infusion   Intravenous Continuous Suzette Battiest, MD 100 mL/hr at 11/18/18 [phone]    . tranexamic acid (CYKLOKAPRON) IVPB 1,000 mg  1,000 mg Intravenous To OR Pete Pelt, PA-C       Allergies  Allergen Reactions  . Flagyl [Metronidazole] Rash    Social History   Tobacco Use  . Smoking status: Never Smoker  . Smokeless tobacco: Never Used  Substance Use Topics  . Alcohol use: Yes    Comment: social    Family History  Problem Relation Age of Onset  . Asthma Mother   . Cancer Mother        AML  . Leukemia Mother   . Heart disease Father   . Hypertension Father   .  Kidney disease Father   . Breast cancer Maternal Aunt      Review of Systems  Musculoskeletal: Positive for back pain and joint pain.  All other systems reviewed and are negative.   Objective:  Physical Exam  Constitutional: She is oriented to person, place, and time. She appears well-developed and well-nourished.  HENT:  Head: Normocephalic and atraumatic.  Eyes: Pupils are equal, round, and reactive to light. EOM are normal.  Neck: Normal range of motion. Neck supple.   Cardiovascular: Normal rate.  Respiratory: Effort normal.  GI: Soft.  Musculoskeletal:     Right hip: She exhibits decreased range of motion, decreased strength, tenderness and bony tenderness.  Neurological: She is alert and oriented to person, place, and time.  Skin: Skin is warm and dry.  Psychiatric: She has a normal mood and affect.    Vital signs in last 24 hours: Temp:  [98.9 F (37.2 C)] 98.9 F (37.2 C) (08/14 0611) Pulse Rate:  [94] 94 (08/14 0611) Resp:  [18] 18 (08/14 0611) BP: (155)/(81) 155/81 (08/14 0611) SpO2:  [99 %] 99 % (08/14 0611)  Labs:   Estimated body mass index is 32.26 kg/m as calculated from the following:   Height as of 11/15/18: 5\' 8"  (1.727 m).   Weight as of 11/15/18: 96.3 kg.   Imaging Review Plain radiographs demonstrate severe degenerative joint disease of the right hip(s). The bone quality appears to be excellent for age and reported activity level.      Assessment/Plan:  End stage arthritis, right hip(s)  The patient history, physical examination, clinical judgement of the provider and imaging studies are consistent with end stage degenerative joint disease of the right hip(s) and total hip arthroplasty is deemed medically necessary. The treatment options including medical management, injection therapy, arthroscopy and arthroplasty were discussed at length. The risks and benefits of total hip arthroplasty were presented and reviewed. The risks due to aseptic loosening, infection, stiffness, dislocation/subluxation,  thromboembolic complications and other imponderables were discussed.  The patient acknowledged the explanation, agreed to proceed with the plan and consent was signed. Patient is being admitted for inpatient treatment for surgery, pain control, PT, OT, prophylactic antibiotics, VTE prophylaxis, progressive ambulation and ADL's and discharge planning.The patient is planning to be discharged home with home health  services    Patient's anticipated LOS is less than 2 midnights, meeting these requirements: - Younger than 5965 - Lives within 1 hour of care - Has a competent adult at home to recover with post-op recover - NO history of  - Chronic pain requiring opiods  - Diabetes  - Coronary Artery Disease  - Heart failure  - Heart attack  - Stroke  - DVT/VTE  - Cardiac arrhythmia  - Respiratory Failure/COPD  - Renal failure  - Anemia  - Advanced Liver disease

## 2018-11-18 NOTE — Brief Op Note (Signed)
11/18/2018  10:06 AM  PATIENT:  [place] A Dehnert  68 y.o. female  PRE-OPERATIVE DIAGNOSIS:  OSTEOARTHRITIS RIGHT HIP  POST-OPERATIVE DIAGNOSIS:  OSTEOARTHRITIS RIGHT HIP  PROCEDURE:  Procedure(s): RIGHT TOTAL HIP ARTHROPLASTY ANTERIOR APPROACH (Right)  SURGEON:  Surgeon(s) and Role:    * Mcarthur Rossetti, MD - Primary  PHYSICIAN ASSISTANT: Benita Stabile, PA-C   ANESTHESIA:   spinal  EBL:  300 mL   COUNTS:  YES  DICTATION: .Other Dictation: Dictation Number [id number]  PLAN OF CARE: Admit to inpatient   PATIENT DISPOSITION:  PACU - hemodynamically stable.   Delay start of Pharmacological VTE agent (>24hrs) due to surgical blood loss or risk of bleeding: no

## 2018-11-18 NOTE — Evaluation (Signed)
Physical Therapy Evaluation Patient Details Name: [name] [name] MRN: [id number] DOB: 10/08/1950 Today's Date: 11/18/2018   History of Present Illness  Pt is a 68 year old female s/p R direct anterior THA  Clinical Impression  Pt is s/p THA resulting in the deficits listed below (see PT Problem List). Pt will benefit from skilled PT to increase their independence and safety with mobility to allow discharge to the venue listed below.  Pt assisted with ambulating in hallway POD #0.  Pt plan to return home upon d/c however needs DME.      Follow Up Recommendations Follow surgeon's recommendation for DC plan and follow-up therapies    Equipment Recommendations  Rolling walker with 5" wheels;3in1 (PT)    Recommendations for Other Services       Precautions / Restrictions Precautions Precautions: None Restrictions Other Position/Activity Restrictions: WBAT      Mobility  Bed Mobility Overal bed mobility: Needs Assistance Bed Mobility: Supine to Sit     Supine to sit: Min guard     General bed mobility comments: min/guard for safety and lines  Transfers Overall transfer level: Needs assistance Equipment used: Rolling walker (2 wheeled) Transfers: Sit to/from Stand Sit to Stand: Min guard         General transfer comment: min/guard for safety, verbal cues for UE and LE positioning for pain control  Ambulation/Gait Ambulation/Gait assistance: Min guard Gait Distance (Feet): 120 Feet Assistive device: Rolling walker (2 wheeled) Gait Pattern/deviations: Step-to pattern;Decreased stance time - right;Antalgic     General Gait Details: verbal cues for sequence, RW positioning, step length, posture  Stairs            Wheelchair Mobility    Modified Rankin (Stroke Patients Only)       Balance                                             Pertinent Vitals/Pain Pain Assessment: 0-10 Pain Score: 3  Pain Location: R hip Pain Descriptors /  Indicators: Burning Pain Intervention(s): Limited activity within patient's tolerance;Monitored during session;Ice applied;Repositioned;Premedicated before session    Home Living Family/patient expects to be discharged to:: Private residence Living Arrangements: Alone Available Help at Discharge: Family Type of Home: House Home Access: Stairs to enter Entrance Stairs-Rails: Doctor, general practiceight;Left Entrance Stairs-Number of Steps: 3 Home Layout: One level Home Equipment: None      Prior Function Level of Independence: Independent               Hand Dominance        Extremity/Trunk Assessment        Lower Extremity Assessment Lower Extremity Assessment: RLE deficits/detail RLE Deficits / Details: anticipated post op hip weakness       Communication   Communication: No difficulties  Cognition Arousal/Alertness: Awake/alert Behavior During Therapy: WFL for tasks assessed/performed Overall Cognitive Status: Within Functional Limits for tasks assessed                                        General Comments      Exercises     Assessment/Plan    PT Assessment Patient needs continued PT services  PT Problem List Decreased strength;Decreased activity tolerance;Decreased balance;Decreased knowledge of use of DME;Pain;Decreased mobility  PT Treatment Interventions Stair training;Therapeutic exercise;DME instruction;Gait training;Balance training;Therapeutic activities;Functional mobility training;Patient/family education    PT Goals (Current goals can be found in the Care Plan section)  Acute Rehab PT Goals PT Goal Formulation: With patient Time For Goal Achievement: 11/22/18 Potential to Achieve Goals: Good    Frequency 7X/week   Barriers to discharge        Co-evaluation               AM-PAC PT "6 Clicks" Mobility  Outcome Measure Help needed turning from your back to your side while in a flat bed without using bedrails?: A  Little Help needed moving from lying on your back to sitting on the side of a flat bed without using bedrails?: A Little Help needed moving to and from a bed to a chair (including a wheelchair)?: A Little Help needed standing up from a chair using your arms (e.g., wheelchair or bedside chair)?: A Little Help needed to walk in hospital room?: A Little Help needed climbing 3-5 steps with a railing? : A Little 6 Click Score: 18    End of Session Equipment Utilized During Treatment: Gait belt Activity Tolerance: Patient tolerated treatment well Patient left: with call bell/phone within reach;in chair;with chair alarm set   PT Visit Diagnosis: Other abnormalities of gait and mobility (R26.89)    Time: 1610-9604 PT Time Calculation (min) (ACUTE ONLY): 13 min   Carmelia Bake, PT, DPT Acute Rehabilitation Services Office: [phone] Pager: [phone]  Trena Platt 11/18/2018, 3:37 PM

## 2018-11-18 NOTE — Op Note (Signed)
NAME: [name], [name] MEDICAL RECORD [id number] ACCOUNT [id number] DATE OF BIRTH:09/28/1950 FACILITY: [facility] LOCATION: [facility] PHYSICIAN:Drew Lips Aretha ParrotY. Nyana Haren, MD  OPERATIVE REPORT  DATE OF PROCEDURE:  11/18/2018  PREOPERATIVE DIAGNOSIS:  Primary osteoarthritis and degenerative joint disease, right hip.  POSTOPERATIVE DIAGNOSIS:  Primary osteoarthritis and degenerative joint disease, right hip.  PROCEDURE:  Right total hip arthroplasty through direct anterior approach.  IMPLANTS:  DePuy Sector Gription acetabular component size 52, size 36+0 neutral polyethylene liner, size 3 ACTIS femoral component with high offset, size 36+5 metal hip ball.  SURGEON:  Vanita PandaChristopher Y. Magnus IvanBlackman, MD  ASSISTANT:  Richardean CanalGilbert Clark, PA-C  ANESTHESIA:  Spinal.  ANTIBIOTICS:  Two grams IV Ancef.  ESTIMATED BLOOD LOSS:  300 mL.  COMPLICATIONS:  None.  INDICATIONS:  The patient is very pleasant [age] year old female well known to me.  She has well-documented evidence of severe arthritis of both her hips.  Her right hip hurts her significantly worse.  At this point, she does wish to proceed with a total  hip arthroplasty.  Her pain is daily, and it has detrimentally affected her activities of daily living, her mobility, and quality of life.  She understands with surgery, there is a risk of acute blood loss anemia, nerve and vessel injury, fracture,  infection, dislocation, DVT, soft tissue issues, skin issues, and implant failure.  She understands our goals are to decrease pain, improve mobility, and overall improve quality of life.  DESCRIPTION OF PROCEDURE:  After informed consent was obtained and appropriate right hip was marked, she was brought to the operating room and sat up on a stretcher where spinal anesthesia was then obtained.  She was then laid in supine position on a  stretcher.  Foley catheter was placed.  We were able to assess her leg lengths and found her leg lengths to be equal.  Traction  boots were placed on both her feet.  Next, she was placed supine on the Hana fracture table with the perineal post in place  and both legs in in-line skeletal traction device and no traction applied.  Her right operative hip was prepped and draped with DuraPrep and sterile drapes.  A time-out was called, and she was identified as correct patient and correct right hip.  We then  made an incision just inferior and posterior to the anterior superior iliac spine and carried this obliquely down the leg.  We dissected down tensor fascia lata muscle.  Tensor fascia was then divided longitudinally to proceed with direct anterior  approach to the hip.  We identified and cauterized circumflex vessels and identified the hip capsule, opened up the hip capsule in an L-type format, finding moderate joint effusion and significant periarticular osteophytes around the femoral head and  neck.  We placed a Cobra retractor around the medial and lateral femoral neck and then made our femoral neck cut with an oscillating saw just proximal to the lesser trochanter.  I completed this with an osteotome.  I placed a corkscrew guide in the  femoral head and removed the femoral head in its entirety and found a wide area devoid of cartilage.  We then placed a bent Hohmann over the medial acetabular rim and released the transverse acetabular ligament.  We removed remnants of the acetabular  labrum and other debris from the acetabulum and then began reaming under direct visualization from a size 44 reamer in stepwise increments up to a size 51 with all reamers under direct visualization, the last reamer under direct fluoroscopy so we  could  obtain our depth of reaming, our inclination, and anteversion.  We then placed the real DePuy Sector Gription acetabular component size 52 and a 36+0 neutral polyethylene liner for that size 52 acetabular component.  Attention was then turned to the  femur.  With the leg externally rotated to 120  degrees, extended and adducted, we were able to place a Mueller retractor medially and a Hohmann retractor behind the greater trochanter.  We released the lateral joint capsule and used a box-cutting  osteotome to enter the femoral canal and a rongeur to lateralize.  We then began broaching using the ACTIS broaching system from a size zero broach up to a size 3.  With the size 3 in place, we trialed a standard offset femoral neck and a 32+1.5 hip  ball, reduced this in the acetabulum.  We felt like we needed just a little bit more offset and leg length.  It was stable on exam, but we then dislocated the hip and removed the trial components.  We placed the real high-offset femoral component ACTIS  size 3 and the real 36+5 metal hip ball.  Again reduced this in the acetabulum, and I was pleased with leg length, offset, range of motion, and stability, assessed radiographically and clinically.  We then irrigated the soft tissue with normal saline  solution using pulsatile lavage.  We closed the remnants of the joint capsule with interrupted #1 Ethibond suture, followed by closing the tensor fascia with interrupted #1 Vicryl.  0 Vicryl was used to close deep tissue, 2-0 Vicryl was used to close the  subcutaneous tissue, and interrupted staples were placed on the skin incision.  Xeroform and an Aquacel dressing were applied.  She was taken off the Hana table and taken to recovery room in stable condition.  All final counts were correct.  There were  no complications noted.  Note Benita Stabile, PA-C, assisted the entire case.  His assistance was crucial for facilitating all aspects of this case.  LN/NUANCE  D:11/18/2018 T:11/18/2018 JOB:[id number]

## 2018-11-18 NOTE — Transfer of Care (Signed)
Immediate Anesthesia Transfer of Care Note  Patient: [name] [name]  Procedure(s) Performed: RIGHT TOTAL HIP ARTHROPLASTY ANTERIOR APPROACH (Right )  Patient Location: PACU  Anesthesia Type:Spinal  Level of Consciousness: sedated  Airway & Oxygen Therapy: Patient Spontanous Breathing and Patient connected to face mask oxygen  Post-op Assessment: Report given to RN and Post -op Vital signs reviewed and stable  Post vital signs: Reviewed and stable  Last Vitals:  Vitals Value Taken Time  BP    Temp    Pulse    Resp    SpO2      Last Pain:  Vitals:   11/18/18 0624  TempSrc:   PainSc: 0-No pain         Complications: No apparent anesthesia complications

## 2018-11-18 NOTE — Anesthesia Preprocedure Evaluation (Signed)
Anesthesia Evaluation  Patient identified by MRN, date of birth, ID band Patient awake    Reviewed: Allergy & Precautions, H&P , NPO status , Patient's Chart, lab work & pertinent test results  Airway Mallampati: II   Neck ROM: full    Dental   Pulmonary asthma ,    breath sounds clear to auscultation       Cardiovascular hypertension,  Rhythm:regular Rate:Normal     Neuro/Psych    GI/Hepatic   Endo/Other  diabetes, Type 2Hypothyroidism   Renal/GU      Musculoskeletal  (+) Arthritis ,   Abdominal   Peds  Hematology   Anesthesia Other Findings   Reproductive/Obstetrics                             Anesthesia Physical Anesthesia Plan  ASA: II  Anesthesia Plan: Spinal   Post-op Pain Management:    Induction: Intravenous  PONV Risk Score and Plan: 2 and Ondansetron, Dexamethasone, Propofol infusion, Midazolam and Treatment may vary due to age or medical condition  Airway Management Planned: Simple Face Mask  Additional Equipment:   Intra-op Plan:   Post-operative Plan:   Informed Consent: I have reviewed the patients History and Physical, chart, labs and discussed the procedure including the risks, benefits and alternatives for the proposed anesthesia with the patient or authorized representative who has indicated his/her understanding and acceptance.       Plan Discussed with: CRNA, Anesthesiologist and Surgeon  Anesthesia Plan Comments:         Anesthesia Quick Evaluation

## 2018-11-18 NOTE — Anesthesia Procedure Notes (Signed)
Spinal  Patient location during procedure: OR Start time: 11/18/2018 9:04 AM End time: 11/18/2018 9:09 AM Staffing Anesthesiologist: Albertha Ghee, MD Performed: anesthesiologist  Preanesthetic Checklist Completed: patient identified, surgical consent, pre-op evaluation, timeout performed, IV checked, risks and benefits discussed and monitors and equipment checked Spinal Block Patient position: sitting Prep: DuraPrep Patient monitoring: cardiac monitor, continuous pulse ox and blood pressure Approach: left paramedian Location: L3-4 Injection technique: single-shot Needle Needle type: Pencan  Needle gauge: 24 G Needle length: 9 cm Assessment Sensory level: T10 Additional Notes Functioning IV was confirmed and monitors were applied. Sterile prep and drape, including hand hygiene and sterile gloves were used. The patient was positioned and the spine was prepped. The skin was anesthetized with lidocaine.  Free flow of clear CSF was obtained prior to injecting local anesthetic into the CSF.  The spinal needle aspirated freely following injection.  The needle was carefully withdrawn.  The patient tolerated the procedure well.

## 2018-11-19 ENCOUNTER — Other Ambulatory Visit: Payer: Self-pay | Admitting: Orthopaedic Surgery

## 2018-11-19 DIAGNOSIS — M160 Bilateral primary osteoarthritis of hip: Secondary | ICD-10-CM | POA: Diagnosis not present

## 2018-11-19 LAB — CBC
HCT: 29.4 % — ABNORMAL LOW (ref 36.0–46.0)
Hemoglobin: 9.1 g/dL — ABNORMAL LOW (ref 12.0–15.0)
MCH: 27.6 pg (ref 26.0–34.0)
MCHC: 31 g/dL (ref 30.0–36.0)
MCV: 89.1 fL (ref 80.0–100.0)
Platelets: 333 10*3/uL (ref 150–400)
RBC: 3.3 MIL/uL — ABNORMAL LOW (ref 3.87–5.11)
RDW: 14.4 % (ref 11.5–15.5)
WBC: 18.4 10*3/uL — ABNORMAL HIGH (ref 4.0–10.5)
nRBC: 0 % (ref 0.0–0.2)

## 2018-11-19 LAB — BASIC METABOLIC PANEL
Anion gap: 9 (ref 5–15)
BUN: 15 mg/dL (ref 8–23)
CO2: 23 mmol/L (ref 22–32)
Calcium: 8.4 mg/dL — ABNORMAL LOW (ref 8.9–10.3)
Chloride: 107 mmol/L (ref 98–111)
Creatinine, Ser: 0.93 mg/dL (ref 0.44–1.00)
GFR calc Af Amer: >60 mL/min (ref >60)
GFR calc non Af Amer: >60 mL/min (ref >60)
Glucose, Bld: 134 mg/dL — ABNORMAL HIGH (ref 70–99)
Potassium: 4.2 mmol/L (ref 3.5–5.1)
Sodium: 139 mmol/L (ref 135–145)

## 2018-11-19 LAB — GLUCOSE, CAPILLARY
Glucose-Capillary: 129 mg/dL — ABNORMAL HIGH (ref 70–99)
Glucose-Capillary: 107 mg/dL — ABNORMAL HIGH (ref 70–99)

## 2018-11-19 MED ORDER — METHOCARBAMOL 500 MG PO TABS: 500.0000 mg | ORAL_TABLET | Freq: Four times a day (QID) | ORAL | 0 refills | Status: DC | PRN

## 2018-11-19 MED ORDER — OXYCODONE HCL 5 MG PO TABS: 5.0000 mg | ORAL_TABLET | Freq: Four times a day (QID) | ORAL | 0 refills | Status: DC | PRN

## 2018-11-19 MED ORDER — ASPIRIN 81 MG PO CHEW: 81.0000 mg | CHEWABLE_TABLET | Freq: Two times a day (BID) | ORAL | 0 refills | Status: DC

## 2018-11-19 NOTE — Progress Notes (Signed)
Physical Therapy Treatment Patient Details Name: [name] [name] MRN: [id number] DOB: 01/09/1951 Today's Date: 11/19/2018    History of Present Illness Pt is a 68 year old female s/p R direct anterior THA    PT Comments    Progressing well with mobility. Reviewed/practiced gait training and stair training. All education completed. Okay to d/c from PT standpoint.    Follow Up Recommendations  Follow surgeon's recommendation for DC plan and follow-up therapies     Equipment Recommendations       Recommendations for Other Services       Precautions / Restrictions Precautions Precautions: Fall Restrictions Weight Bearing Restrictions: No Other Position/Activity Restrictions: WBAT    Mobility  Bed Mobility               General bed mobility comments: oob in recliner  Transfers Overall transfer level: Needs assistance Equipment used: Rolling walker (2 wheeled) Transfers: Sit to/from Stand Sit to Stand: Min guard         General transfer comment: Close guard for safety. VCs safety, hand placement.  Ambulation/Gait Ambulation/Gait assistance: Min guard Gait Distance (Feet): 120 Feet Assistive device: Rolling walker (2 wheeled) Gait Pattern/deviations: Step-to pattern;Step-through pattern;Decreased stride length     General Gait Details: Close guard for safety.   Stairs Stairs: Yes Stairs assistance: Min guard Stair Management: One rail Right;Step to pattern Number of Stairs: 2 General stair comments: up and over portable stairs. VCs safety, technique, sequence. 2 hands on 1 rail while ascending/descending   Wheelchair Mobility    Modified Rankin (Stroke Patients Only)       Balance Overall balance assessment: Mild deficits observed, not formally tested                                          Cognition Arousal/Alertness: Awake/alert Behavior During Therapy: WFL for tasks assessed/performed Overall Cognitive Status: Within  Functional Limits for tasks assessed                                        Exercises      General Comments        Pertinent Vitals/Pain Pain Assessment: 0-10 Pain Score: 5  Pain Location: R hip Pain Descriptors / Indicators: Discomfort;Sore Pain Intervention(s): Monitored during session    Home Living                      Prior Function            PT Goals (current goals can now be found in the care plan section) Progress towards PT goals: Progressing toward goals    Frequency    7X/week      PT Plan Current plan remains appropriate    Co-evaluation              AM-PAC PT "6 Clicks" Mobility   Outcome Measure  Help needed turning from your back to your side while in a flat bed without using bedrails?: A Little Help needed moving from lying on your back to sitting on the side of a flat bed without using bedrails?: A Little Help needed moving to and from a bed to a chair (including a wheelchair)?: A Little Help needed standing up from a chair using your arms (e.g., wheelchair or bedside  chair)?: A Little Help needed to walk in hospital room?: A Little Help needed climbing 3-5 steps with a railing? : A Little 6 Click Score: 18    End of Session Equipment Utilized During Treatment: Gait belt Activity Tolerance: Patient tolerated treatment well Patient left: in chair;with call bell/phone within reach   PT Visit Diagnosis: Other abnormalities of gait and mobility (R26.89);Pain Pain - Right/Left: Right Pain - part of body: Hip     Time: 1355-1404 PT Time Calculation (min) (ACUTE ONLY): 9 min  Charges:  $Gait Training: 8-22 mins                        Weston Anna, PT Acute Rehabilitation Services Pager: [phone] Office: [phone]

## 2018-11-19 NOTE — Care Management Obs Status (Signed)
[facility] NOTIFICATION   Patient Details  Name: [name] [name] MRN: [id number] Date of Birth: 1950/04/21   Medicare Observation Status Notification Given:  Yes    Joaquin Courts, RN 11/19/2018, 1:27 PM

## 2018-11-19 NOTE — Discharge Summary (Signed)
Patient ID: [name] [name] MRN: [id number] DOB/AGE: 67/05/1950 67 y.o.  Admit date: 11/18/2018 Discharge date: 11/19/2018  Admission Diagnoses:  Principal Problem:   Unilateral primary osteoarthritis, right hip Active Problems:   Status post total replacement of right hip   Discharge Diagnoses:  Same  Past Medical History:  Diagnosis Date  . Allergy   . Anemia   . Arthritis   . Asthma    as child only  . Back pain   . Diabetes mellitus without complication (HCC)   . Hypertension   . Hypothyroidism     Surgeries: Procedure(s): RIGHT TOTAL HIP ARTHROPLASTY ANTERIOR APPROACH on 11/18/2018   Consultants:   Discharged Condition: Improved  Hospital Course: [name] [name] is an 68 y.o. female who was admitted 11/18/2018 for operative treatment ofUnilateral primary osteoarthritis, right hip. Patient has severe unremitting pain that affects sleep, daily activities, and work/hobbies. After pre-op clearance the patient was taken to the operating room on 11/18/2018 and underwent  Procedure(s): RIGHT TOTAL HIP ARTHROPLASTY ANTERIOR APPROACH.    Patient was given perioperative antibiotics:  Anti-infectives (From admission, onward)   Start     Dose/Rate Route Frequency Ordered Stop   11/18/18 1500  ceFAZolin (ANCEF) IVPB 1 g/50 mL premix     1 g 100 mL/hr over 30 Minutes Intravenous Every 6 hours 11/18/18 1331 11/18/18 2352   11/18/18 0615  ceFAZolin (ANCEF) IVPB 2g/100 mL premix     2 g 200 mL/hr over 30 Minutes Intravenous On call to O.R. 11/18/18 [id number] 11/18/18 [id number]       Patient was given sequential compression devices, early ambulation, and chemoprophylaxis to prevent DVT.  Patient benefited maximally from hospital stay and there were no complications.    Recent vital signs:  Patient Vitals for the past 24 hrs:  BP Temp Temp src Pulse Resp SpO2  11/19/18 0457 121/64 98.6 F (37 C) Oral 82 16 98 %  11/19/18 0041 129/75 99.2 F (37.3 C) Oral 88 16 97 %  11/18/18 2134  139/72 99.4 F (37.4 C) Oral 84 16 100 %  11/18/18 1702 127/88 - - 84 16 100 %  11/18/18 1559 132/75 98.4 F (36.9 C) - 84 - 100 %  11/18/18 1451 126/72 97.9 F (36.6 C) - 84 - 100 %  11/18/18 1343 115/67 - - 83 14 100 %  11/18/18 1230 136/83 97.7 F (36.5 C) - 79 (!) 21 97 %  11/18/18 1215 136/77 - - 78 (!) 21 97 %  11/18/18 1200 132/85 - - 78 20 97 %  11/18/18 1145 130/82 - - 73 19 98 %  11/18/18 1130 129/78 - - 75 20 98 %  11/18/18 1115 123/76 - - 77 17 100 %  11/18/18 1100 117/82 - - 75 20 100 %     Recent laboratory studies:  Recent Labs    11/19/18 0225  WBC 18.4*  HGB 9.1*  HCT 29.4*  PLT 333  NA 139  K 4.2  CL 107  CO2 23  BUN 15  CREATININE 0.93  GLUCOSE 134*  CALCIUM 8.4*     Discharge Medications:   Allergies as of 11/19/2018      Reactions   Flagyl [metronidazole] Rash      Medication List    STOP taking these medications   acetaminophen-codeine 300-30 MG tablet Commonly known as: TYLENOL #3     TAKE these medications   amLODipine 5 MG tablet Commonly known as: NORVASC Take 1 tablet (5 mg total) by  mouth daily.   aspirin 81 MG chewable tablet Chew 1 tablet (81 mg total) by mouth 2 (two) times daily.   atorvastatin 20 MG tablet Commonly known as: LIPITOR Take 20 mg by mouth daily.   CALCIUM 600 + D PO Take 1 tablet by mouth daily.   ibuprofen 200 MG tablet Commonly known as: ADVIL Take 400 mg by mouth every 6 (six) hours as needed for moderate pain.   levothyroxine 25 MCG tablet Commonly known as: SYNTHROID Take 25 mcg by mouth daily before breakfast.   metFORMIN 500 MG tablet Commonly known as: GLUCOPHAGE Take 500 mg by mouth 2 (two) times daily with a meal.   methocarbamol 500 MG tablet Commonly known as: ROBAXIN Take 1 tablet (500 mg total) by mouth every 6 (six) hours as needed for muscle spasms.   oxyCODONE 5 MG immediate release tablet Commonly known as: Oxy IR/ROXICODONE Take 1-2 tablets (5-10 mg total) by mouth every  6 (six) hours as needed for moderate pain (pain score 4-6).            Durable Medical Equipment  (From admission, onward)         Start     Ordered   11/18/18 1332  DME Walker rolling  Once    Question:  Patient needs a walker to treat with the following condition  Answer:  Status post total replacement of right hip   11/18/18 1331   11/18/18 1332  DME 3 n 1  Once     11/18/18 1331          Diagnostic Studies: Dg Pelvis Portable  Result Date: 11/18/2018 CLINICAL DATA:  Status post right hip arthroplasty. EXAM: PORTABLE PELVIS 1-2 VIEWS COMPARISON:  05/25/2018. FINDINGS: Interval right total hip arthroplasty. The hardware components are in anatomic alignment. There is no periprosthetic fracture or dislocation identified. Overlying skin staples and subcutaneous gas noted. Advanced left hip osteoarthritis is again noted. IMPRESSION: 1. Status post right total hip arthroplasty.  No complications. Electronically Signed   By: Kerby Moors M.D.   On: 11/18/2018 11:16   Dg C-arm 1-60 Min-no Report  Result Date: 11/18/2018 Fluoroscopy was utilized by the requesting physician.  No radiographic interpretation.   Dg Hip Operative Unilat W Or W/o Pelvis Right  Result Date: 11/18/2018 CLINICAL DATA:  Right hip replacement EXAM: OPERATIVE RIGHT HIP WITH PELVIS COMPARISON:  None. FLUOROSCOPY TIME:  Radiation Exposure Index (as provided by the fluoroscopic device): 3 mGy If the device does not provide the exposure index: Fluoroscopy Time:  24 seconds Number of Acquired Images:  2 FINDINGS: Right hip prosthesis is noted in satisfactory position. No acute fracture or soft tissue abnormality is noted. IMPRESSION: Status post right hip prosthesis Electronically Signed   By: Inez Catalina M.D.   On: 11/18/2018 10:51    Disposition: Discharge disposition: 01-Home or Self Care         Follow-up Information    Mcarthur Rossetti, MD. Schedule an appointment as soon as possible for a visit  in 2 week(s).   Specialty: Orthopedic Surgery Contact information: [place] [address] [phone]            Signed: Mcarthur Rossetti 11/19/2018, 10:48 AM

## 2018-11-19 NOTE — Plan of Care (Signed)
Plan of care for post op day 1 discussed with patient. All questions answered this AM.  Will continue to monitor patient progress.

## 2018-11-19 NOTE — TOC Initial Note (Signed)
Transition of Care [facility]) - Initial/Assessment Note    Patient Details  Name: [name] [name] MRN: [id number] Date of Birth: 09-Aug-1950  Transition of Care (TOC) CM/SW Contact:    Joaquin Courts, RN Phone Number: 11/19/2018, 11:24 AM  Clinical Narrative:     CM spoke with patient at bedside. Patient set up with [facility] for [place]. [employer] to deliver rolling walker and 3-in-1 to bedside for home use.                Expected Discharge Plan: [place] Barriers to Discharge: No Barriers Identified   Patient Goals and CMS Choice Patient states their goals for this hospitalization and ongoing recovery are:: to go home CMS Medicare.gov Compare Post Acute Care list provided to:: Patient Choice offered to / list presented to : Patient  Expected Discharge Plan and Services Expected Discharge Plan: [place]   Discharge Planning Services: CM Consult Post Acute Care Choice: [place] arrangements for the past 2 months: Single Family Home Expected Discharge Date: 11/19/18               DME Arranged: 3-N-1, [place] Rile rolling DME Agency: [employer] Date DME Agency Contacted: 11/19/18 Time DME Agency Contacted: [phone] Representative spoke with at DME Agency: [place]: PT [place]: [place] Date [place]: 11/19/18 Time [place]: 80 Representative spoke with at [place]: Tommi Rumps  Prior Living Arrangements/Services Living arrangements for the past 2 months: [place] Lives with:: Self Patient language and need for interpreter reviewed:: Yes Do you feel safe going back to the place where you live?: Yes      Need for Family Participation in Patient Care: Yes (Comment) Care giver support system in place?: Yes (comment)   Criminal Activity/Legal Involvement Pertinent to Current Situation/Hospitalization: No - Comment as needed  Activities of Daily Living Home Assistive Devices/Equipment:  None ADL Screening (condition at time of admission) Patient's cognitive ability adequate to safely complete daily activities?: Yes Is the patient deaf or have difficulty hearing?: No Does the patient have difficulty seeing, even when wearing glasses/contacts?: No Does the patient have difficulty concentrating, remembering, or making decisions?: No Patient able to express need for assistance with ADLs?: No Does the patient have difficulty dressing or bathing?: No Independently performs ADLs?: Yes (appropriate for developmental age) Does the patient have difficulty walking or climbing stairs?: No Weakness of Legs: None Weakness of Arms/Hands: None  Permission Sought/Granted                  Emotional Assessment Appearance:: Appears stated age Attitude/Demeanor/Rapport: Engaged Affect (typically observed): Accepting Orientation: : Oriented to Place, Oriented to  Time, Oriented to Situation, Oriented to Self   Psych Involvement: No (comment)  Admission diagnosis:  OSTEOARTHRITIS RIGHT HIP Patient Active Problem List   Diagnosis Date Noted  . Status post total replacement of right hip 11/18/2018  . Unilateral primary osteoarthritis, right hip 09/29/2018  . Bilateral primary osteoarthritis of hip 05/25/2018  . Spinal stenosis of lumbar region with neurogenic claudication 04/20/2018  . Impingement syndrome of right shoulder 04/20/2018  . Essential hypertension, benign 02/20/2016  . Elevated blood pressure reading 09/29/2011  . Chronic back pain 09/29/2011  . Leg edema 09/29/2011  . Obesity 09/29/2011   PCP:  Audley Hose, MD Pharmacy:   [facility], [place] [address] Phone: [phone] Fax: [phone]  [id number] [place] [address] [address]Phone: [phone]: [phone]    Social Determinants of Health (SDOH)  Interventions    Readmission Risk Interventions No flowsheet data found.

## 2018-11-19 NOTE — Progress Notes (Signed)
    Home health agencies that serve [phone].        [place] Quality of Patient Care Rating Patient Survey Summary Rating  [facility] [phone] 4 out of 5 stars 4 out of [place] [phone] 4  out of 5 stars 3 out of [place] [phone] 4 out of 5 stars 4 out of [place] [phone] 4 out of 5 stars 4 out of 5 stars  [facility] [place] [phone] 3  out of 5 stars 4 out of [place] [phone] 3 out of 5 stars 4 out of [place] number Footnote as displayed on [place]  1 This agency provides services under a federal waiver program to non-traditional, chronic long term population.  2 This agency provides services to a special needs population.  3 Not Available.  4 The number of patient episodes for this measure is too small to report.  5 This measure currently does not have data or provider has been certified/recertified for less than 6 months.  6 The national average for this measure is not provided because of state-to-state differences in data collection.  7 Medicare is not displaying rates for this measure for any home health agency, because of an issue with the data.  8 There were problems with the data and they are being corrected.  9 Zero, or very few, patients met the survey's rules for inclusion. The scores shown, if any, reflect a very small number of surveys and may not accurately tell how an agency is doing.  10 Survey results are based on less than 12 months of data.  11 Fewer than 70 patients completed the survey. Use the scores shown, if any, with caution as the number of surveys may be too low to accurately tell how an agency is doing.  12 No survey results are available for this period.   13 Data suppressed by CMS for one or more quarters.

## 2018-11-19 NOTE — Discharge Instructions (Signed)

## 2018-11-19 NOTE — Progress Notes (Signed)
Physical Therapy Treatment Patient Details Name: [name] [name] MRN: [id number] DOB: 03/08/1951 Today's Date: 11/19/2018    History of Present Illness Pt is a 68 year old female s/p R direct anterior THA    PT Comments    Pt continues to participate well. Will plan to have a 2nd session to practice stair negotiation prior to possible d/c home later today. Plan is for d/c home with HHPT.    Follow Up Recommendations  Follow surgeon's recommendation for DC plan and follow-up therapies     Equipment Recommendations  Rolling walker with 5" wheels;3in1 (PT)    Recommendations for Other Services       Precautions / Restrictions Precautions Precautions: Fall Restrictions Weight Bearing Restrictions: No Other Position/Activity Restrictions: WBAT    Mobility  Bed Mobility Overal bed mobility: Needs Assistance Bed Mobility: Supine to Sit     Supine to sit: Min assist;HOB elevated     General bed mobility comments: Small amount of assist for R LE. Increased time.  Transfers Overall transfer level: Needs assistance Equipment used: Rolling walker (2 wheeled) Transfers: Sit to/from Stand Sit to Stand: Min guard;From elevated surface         General transfer comment: Close guard for safety. VCs safety, hand placement.  Ambulation/Gait Ambulation/Gait assistance: Min guard   Assistive device: Rolling walker (2 wheeled) Gait Pattern/deviations: Step-to pattern;Step-through pattern;Decreased stride length     General Gait Details: Close guard for safety.   Stairs             Wheelchair Mobility    Modified Rankin (Stroke Patients Only)       Balance Overall balance assessment: Mild deficits observed, not formally tested                                          Cognition Arousal/Alertness: Awake/alert Behavior During Therapy: WFL for tasks assessed/performed Overall Cognitive Status: Within Functional Limits for tasks assessed                                         Exercises Total Joint Exercises Ankle Circles/Pumps: AROM;Both;10 reps;Supine Quad Sets: AROM;Both;10 reps;Supine Heel Slides: AAROM;Right;10 reps;Supine Hip ABduction/ADduction: AAROM;Right;10 reps;Supine;AROM;Standing Marching in Standing: AROM;Both;10 reps;Standing    General Comments        Pertinent Vitals/Pain Pain Assessment: 0-10 Pain Score: 5  Pain Location: R hp Pain Descriptors / Indicators: Discomfort;Sore Pain Intervention(s): Monitored during session;Ice applied    Home Living                      Prior Function            PT Goals (current goals can now be found in the care plan section) Progress towards PT goals: Progressing toward goals    Frequency    7X/week      PT Plan Current plan remains appropriate    Co-evaluation              AM-PAC PT "6 Clicks" Mobility   Outcome Measure  Help needed turning from your back to your side while in a flat bed without using bedrails?: A Little Help needed moving from lying on your back to sitting on the side of a flat bed without using bedrails?: A Little Help needed moving  to and from a bed to a chair (including a wheelchair)?: A Little Help needed standing up from a chair using your arms (e.g., wheelchair or bedside chair)?: A Little Help needed to walk in hospital room?: A Little Help needed climbing 3-5 steps with a railing? : A Little 6 Click Score: 18    End of Session Equipment Utilized During Treatment: Gait belt Activity Tolerance: Patient tolerated treatment well Patient left: in chair;with call bell/phone within reach   PT Visit Diagnosis: Other abnormalities of gait and mobility (R26.89)     Time: 8250-0370 PT Time Calculation (min) (ACUTE ONLY): 20 min  Charges:  $Gait Training: 8-22 mins               Weston Anna, [place] Pager: [phone] Office: [phone]

## 2018-11-19 NOTE — Progress Notes (Signed)
Subjective: 1 Day Post-Op Procedure(s) (LRB): RIGHT TOTAL HIP ARTHROPLASTY ANTERIOR APPROACH (Right) Patient reports pain as moderate.  Mobilizing well.  Acute blood loss anemia from surgery, but tolerating well.  Objective: Vital signs in last 24 hours: Temp:  [97.7 F (36.5 C)-99.4 F (37.4 C)] 98.6 F (37 C) (08/15 0457) Pulse Rate:  [73-88] 82 (08/15 0457) Resp:  [14-23] 16 (08/15 0457) BP: (115-139)/(64-88) 121/64 (08/15 0457) SpO2:  [97 %-100 %] 98 % (08/15 0457)  Intake/Output from previous day: 08/14 0701 - 08/15 0700 In: 3555.9 [P.O.:240; I.V.:3215.9; IV Piggyback:100] Out: 1475 [Urine:1175; Blood:300] Intake/Output this shift: No intake/output data recorded.  Recent Labs    11/19/18 0225  HGB 9.1*   Recent Labs    11/19/18 0225  WBC 18.4*  RBC 3.30*  HCT 29.4*  PLT 333   Recent Labs    11/19/18 0225  NA 139  K 4.2  CL 107  CO2 23  BUN 15  CREATININE 0.93  GLUCOSE 134*  CALCIUM 8.4*   No results for input(s): LABPT, INR in the last 72 hours.  Sensation intact distally Intact pulses distally Dorsiflexion/Plantar flexion intact Incision: dressing C/D/I   Assessment/Plan: 1 Day Post-Op Procedure(s) (LRB): RIGHT TOTAL HIP ARTHROPLASTY ANTERIOR APPROACH (Right) Up with therapy Discharge home with home health this afternoon.    Patient's anticipated LOS is less than 2 midnights, meeting these requirements: - Younger than 40 - Lives within 1 hour of care - Has a competent adult at home to recover with post-op recover - NO history of  - Chronic pain requiring opiods  - Diabetes  - Coronary Artery Disease  - Heart failure  - Heart attack  - Stroke  - DVT/VTE  - Cardiac arrhythmia  - Respiratory Failure/COPD  - Renal failure  - Anemia  - Advanced Liver disease       Mcarthur Rossetti 11/19/2018, 10:43 AM

## 2018-11-21 ENCOUNTER — Encounter (HOSPITAL_COMMUNITY): Payer: Self-pay | Admitting: Orthopaedic Surgery

## 2018-12-01 ENCOUNTER — Ambulatory Visit (INDEPENDENT_AMBULATORY_CARE_PROVIDER_SITE_OTHER): Payer: Medicare HMO | Admitting: Orthopaedic Surgery

## 2018-12-01 ENCOUNTER — Encounter: Payer: Self-pay | Admitting: Orthopaedic Surgery

## 2018-12-01 DIAGNOSIS — Z96641 Presence of right artificial hip joint: Secondary | ICD-10-CM

## 2018-12-01 NOTE — Progress Notes (Signed)
HPI: [name] returns today 13 days status post right total hip arthroplasty.  She is overall doing well.  She is ambulating with a cane.  She is had no shortness of breath fevers chills.  She feels that her range of motion and strength are improving.  She is been taking aspirin 81 mg twice daily for DVT prophylaxis.  Physical exam: Right hip surgical incisions well approximated staples no signs of infection.  Very small seroma.  Calf supple nontender dorsiflexion plantarflexion ankle intact.  Walks with a cane in left hand with a slight antalgic gait.  Impression: Status post right total hip arthroplasty 11/18/2018  Plan: Staples removed Steri-Strips applied.  She is able to get incision wet in shower.  She is to work on scar tissue mobilization.  She will follow-up in 4 weeks sooner if there is any concerns in regards to the hip incision or the seroma becomes larger.  Questions were encouraged and answered at length today.  She will go on 81 mg aspirin once daily for a week and then discontinue as she was on no aspirin prior to surgery.

## 2018-12-02 ENCOUNTER — Telehealth: Payer: Self-pay | Admitting: Orthopaedic Surgery

## 2018-12-02 NOTE — Telephone Encounter (Signed)
I left voicemail for patient advising. 

## 2018-12-02 NOTE — Telephone Encounter (Signed)
Patient called asked if she should use heat on the incision site. Patient said she wanted to ask Dr Ninfa Linden. The number to contact patient is [phone]

## 2018-12-02 NOTE — Telephone Encounter (Signed)
It will be fine to use heat.

## 2018-12-02 NOTE — Telephone Encounter (Signed)
Please advise 

## 2018-12-05 NOTE — Telephone Encounter (Signed)
See below

## 2018-12-05 NOTE — Telephone Encounter (Signed)
Patient called stating that she is using the heat all night long and it is not helping.  She wanted to know if there was something that Dr. Ninfa Linden could call in for her to help the fluid.  Thank you.

## 2018-12-20 ENCOUNTER — Telehealth: Payer: Self-pay | Admitting: Orthopaedic Surgery

## 2018-12-20 NOTE — Telephone Encounter (Signed)
[facility] called to continue verbal orders  2x week 3.  Please call Simona Huh to advise [phone]

## 2018-12-20 NOTE — Telephone Encounter (Signed)
Verbal order left on VM  

## 2018-12-29 ENCOUNTER — Encounter: Payer: Self-pay | Admitting: Orthopaedic Surgery

## 2018-12-29 ENCOUNTER — Ambulatory Visit (INDEPENDENT_AMBULATORY_CARE_PROVIDER_SITE_OTHER): Payer: Medicare HMO | Admitting: Orthopaedic Surgery

## 2018-12-29 DIAGNOSIS — Z96641 Presence of right artificial hip joint: Secondary | ICD-10-CM

## 2018-12-29 NOTE — Progress Notes (Signed)
The patient comes in today 6 weeks status post a right total hip arthroplasty.  She is still having some problems with mobility and getting around.  Her first episode caused some pain in the hip but then she walks it off.  She is still using a cane as well.  She is driving on her own.  She is not really taking any type of significant medications and she does report that she does not do well with muscle relaxants.  When I examined her sitting both hips move fluidly.  Her right operative hip has full range of motion and is no difficulty with motion at all.  Incisions healed nicely.  She will continue to increase her activities and work on a home exercise program.  She has asked a few more visits of home therapy today we will also design a exercise program for her just to work on mobility and strengthening.  I would like to see her back in about 5 weeks to see how she is doing overall from a clinical standpoint but no x-rays are needed.

## 2019-01-16 ENCOUNTER — Other Ambulatory Visit: Payer: Self-pay

## 2019-01-16 ENCOUNTER — Telehealth: Payer: Self-pay | Admitting: Orthopaedic Surgery

## 2019-01-16 DIAGNOSIS — Z96641 Presence of right artificial hip joint: Secondary | ICD-10-CM

## 2019-01-16 NOTE — Telephone Encounter (Signed)
Order sent.

## 2019-01-16 NOTE — Telephone Encounter (Signed)
Patient called. She would like a referral for outpatient PT sent to [facility] in [place]. Her call back number is [phone]

## 2019-01-18 DIAGNOSIS — M545 Low back pain, unspecified: Secondary | ICD-10-CM | POA: Insufficient documentation

## 2019-01-18 DIAGNOSIS — G8929 Other chronic pain: Secondary | ICD-10-CM | POA: Insufficient documentation

## 2019-01-19 ENCOUNTER — Ambulatory Visit: Payer: Medicare HMO | Attending: Orthopaedic Surgery | Admitting: Physical Therapy

## 2019-01-19 ENCOUNTER — Other Ambulatory Visit: Payer: Self-pay

## 2019-01-19 ENCOUNTER — Encounter: Payer: Self-pay | Admitting: Physical Therapy

## 2019-01-19 DIAGNOSIS — R262 Difficulty in walking, not elsewhere classified: Secondary | ICD-10-CM | POA: Diagnosis present

## 2019-01-19 DIAGNOSIS — M25551 Pain in right hip: Secondary | ICD-10-CM | POA: Diagnosis present

## 2019-01-19 DIAGNOSIS — M25651 Stiffness of right hip, not elsewhere classified: Secondary | ICD-10-CM | POA: Diagnosis present

## 2019-01-19 NOTE — Therapy (Signed)
[facility] [address] [place], [place], [address] Phone: [phone]   Fax:  [phone]  Physical Therapy Evaluation  Patient Details  Name: [name] [name] MRN: [id number] Date of Birth: Jul 26, 1950 Referring Provider (PT): Doneen Poisson, MD   Encounter Date: 01/19/2019  PT End of Session - 01/19/19 0918    Visit Number  1    Number of Visits  12    Date for PT Re-Evaluation  03/16/19    Authorization Type  FOTO, progress note every 10th visit, KX modifier at 15th visit    PT Start Time  0815    PT Stop Time  0859    PT Time Calculation (min)  44 min    Activity Tolerance  Patient tolerated treatment well    Behavior During Therapy  [facility] for tasks assessed/performed       Past Medical History:  Diagnosis Date  . Allergy   . Anemia   . Arthritis   . Asthma    as child only  . Back pain   . Diabetes mellitus without complication (HCC)   . Hypertension   . Hypothyroidism     Past Surgical History:  Procedure Laterality Date  . ABDOMINAL HYSTERECTOMY  1989   fibroid-no longer needs pap  . BACK SURGERY  1987, 1985   DDD  . CARPAL TUNNEL RELEASE Left 05/28/2016   Procedure: CARPAL TUNNEL RELEASE;  Surgeon: Cindee Salt, MD;  Location: [facility];  Service: Orthopedics;  Laterality: Left;  . CARPAL TUNNEL RELEASE Right 03/17/2018   Procedure: CARPAL TUNNEL RELEASE;  Surgeon: Cindee Salt, MD;  Location: [facility];  Service: Orthopedics;  Laterality: Right;  . TOTAL HIP ARTHROPLASTY Right 11/18/2018   Procedure: RIGHT TOTAL HIP ARTHROPLASTY ANTERIOR APPROACH;  Surgeon: Kathryne Hitch, MD;  Location: [facility];  Service: Orthopedics;  Laterality: Right;  . TUBAL LIGATION      There were no vitals filed for this visit.   Subjective Assessment - 01/19/19 0919    Subjective  COVID-19 screening performed upon arrival.Patient arrives to physical therapy with reports of right hip pain and  soreness and difficulty walking secondary to a right total hip replacement on 11/18/2018. Patient reports having home health physical therapy with good response but is still having pain sitting beyond 15 minutes and walking beyond 15 minutes. Patient reports increased soreness when coming to standing but eases as she walks. She requires the cane due to soreness but can walk without it once the pain eases. Patient reports pain at worst as 5/10 and pain at best as 1/10. Patient's goals are to decrease pain, improve movement, improve strength and improve ability to perform home and work activities.    Pertinent History  Right total hip replacement 11/18/2018; chronic back pain, HTN, DM    Limitations  House hold activities;Sitting    How long can you sit comfortably?  15 mins    How long can you stand comfortably?  15-20 mins    How long can you walk comfortably?  15-20 mins    Patient Stated Goals  walk without pain, sit without pain    Currently in Pain?  Yes   "Mild"   Pain Score  2     Pain Location  Hip    Pain Orientation  Right    Pain Descriptors / Indicators  Sore    Pain Type  Acute pain    Pain Onset  More than a month ago  Pain Frequency  Intermittent    Aggravating Factors   sitting and walking for too long    Pain Relieving Factors  medication    Effect of Pain on Daily Activities  "not able to walk good when first getting up"         [facility] PT Assessment - 01/19/19 0001      Assessment   Medical Diagnosis  History of total hip replacement, right    Referring Provider (PT)  Jean Rosenthal, MD    Onset Date/Surgical Date  11/18/18    Next MD Visit  02/01/2019    Prior Therapy  home health PT      Precautions   Precautions  Anterior Hip      Restrictions   Weight Bearing Restrictions  No      Balance Screen   Has the patient fallen in the past 6 months  No    Has the patient had a decrease in activity level because of a fear of falling?   No    Is the patient  reluctant to leave their home because of a fear of falling?   No      Home Environment   Living Environment  Private residence    Living Arrangements  Alone    Available Help at Discharge  Family    Type of [place] to enter    Entrance Stairs-Number of Steps  3    Entrance Stairs-Rails  Cannot reach both    [place]  One level      Prior Function   Level of Independence  Independent with basic ADLs;Independent with community mobility with device      Observation/Other Assessments   Skin Integrity  increased scar tissue noted around area of healed incision    Focus on Therapeutic Outcomes (FOTO)   62% limited      ROM / Strength   AROM / PROM / Strength  AROM;Strength      AROM   AROM Assessment Site  Hip    Right/Left Hip  Right    Right Hip Flexion  98    Right Hip ABduction  15      Strength   Strength Assessment Site  Hip    Right/Left Hip  Right    Right Hip Flexion  3+/5    Right Hip Extension  4/5    Right Hip ABduction  4-/5      Palpation   Palpation comment  minimal tenderness to palpation to right lateral hip      Transfers   Comments  Independent but slow with transitions      Ambulation/Gait   Assistive device  Straight cane    Gait Pattern  Step-through pattern;Antalgic;Decreased stance time - right;Decreased stride length;Decreased hip/knee flexion - right;Trunk rotated posteriorly on right                Objective measurements completed on examination: See above findings.              PT Education - 01/19/19 0937    Education Details  seated clam shells, hip adduction, step up forward and lateral    Person(s) Educated  Patient    Methods  Explanation;Demonstration;Handout    Comprehension  Verbalized understanding;Returned demonstration          PT Long Term Goals - 01/19/19 0939      PT LONG TERM GOAL #1   Title  Patient  will be independent with HEP.    Time  6    Period  Weeks     Status  New      PT LONG TERM GOAL #2   Title  Patient will report ability to sit for 30 minutes or greater with right hip pain and soreness less than or equal to 3/10    Time  6    Period  Weeks    Status  New      PT LONG TERM GOAL #3   Title  Patient will demonstrate 4+/5 or greater right hip MMT to improve stability during functional tasks.    Time  6    Period  Weeks    Status  New      PT LONG TERM GOAL #4   Title  Patient will report ability to [place] community distances with no AD and pain less than or equal to 3/10    Time  6    Period  Weeks    Status  New      PT LONG TERM GOAL #5   Title  Patient will demonstrate reciprocal stair negotiation with one rail to safely enter and exit her home.    Time  6    Period  Weeks    Status  New             Plan - 01/19/19 1121    Clinical Impression Statement  Patient is a 68 year old female who presents to physical therapy with right hip pain, decreased right hip ROM, and difficulty walking secondary to a right anterior hip replacement on 11/18/2018. Patient noted with increased tenderness to palpation to area of incision as well as the right lateral hip and glute muscles. Patient noted with slow transitions to standing particularly after sitting for an increased time. Patient ambulates with a straight cane with decreased right stance time, decreased left step length, and decreased right trunk rotation. Patient and PT discussed HEP as well as continuing HEP provided by home health physical therapists. Patient reported understanding and agreement. Patient would benefit from skilled physical therapy to address deficits and address patient's goals.    Personal Factors and Comorbidities  Age;Comorbidity 2    Comorbidities  HTN, DM, R anterior THA 11/18/2018    Examination-Activity Limitations  Transfers;Stand    Stability/Clinical Decision Making  Evolving/Moderate complexity    Clinical Decision Making  Low    Rehab Potential   Good    PT Frequency  2x / week    PT Duration  8 weeks    PT Treatment/Interventions  ADLs/Self Care Home Management;Dry needling;Taping;Cryotherapy;Electrical Stimulation;Iontophoresis 4mg /ml Dexamethasone;Moist Heat;Ultrasound;Traction;Balance training;Therapeutic exercise;Therapeutic activities;Functional mobility training;Stair training;Gait training;Patient/family education;Manual techniques;Passive range of motion    PT Next Visit Plan  Nustep, right LE stengthening, right hip stretching, modalities PRN for pain relief    PT Home Exercise Plan  see patient education section    Consulted and Agree with Plan of Care  Patient       Patient will benefit from skilled therapeutic intervention in order to improve the following deficits and impairments:  Abnormal gait, Pain, Decreased mobility, Decreased balance, Decreased range of motion, Impaired flexibility, Improper body mechanics, Difficulty walking, Decreased strength  Visit Diagnosis: Pain in right hip - Plan: PT plan of care cert/re-cert  Stiffness of right hip, not elsewhere classified - Plan: PT plan of care cert/re-cert  Difficulty in walking, not elsewhere classified - Plan: PT plan of care cert/re-cert  Problem List Patient Active Problem List   Diagnosis Date Noted  . Status post total replacement of right hip 11/18/2018  . Unilateral primary osteoarthritis, right hip 09/29/2018  . Bilateral primary osteoarthritis of hip 05/25/2018  . Spinal stenosis of lumbar region with neurogenic claudication 04/20/2018  . Impingement syndrome of right shoulder 04/20/2018  . Essential hypertension, benign 02/20/2016  . Elevated blood pressure reading 09/29/2011  . Chronic back pain 09/29/2011  . Leg edema 09/29/2011  . Obesity 09/29/2011    Guss Bunde, PT, DPT 01/19/2019, 11:35 AM  [facility] [address] [place], [place], [address] Phone: [phone]   Fax:   [phone]  Name: [name] [name] MRN: [id number] Date of Birth: 11/08/1950

## 2019-01-24 ENCOUNTER — Ambulatory Visit: Payer: Medicare HMO | Admitting: Physical Therapy

## 2019-01-24 ENCOUNTER — Other Ambulatory Visit: Payer: Self-pay

## 2019-01-24 ENCOUNTER — Encounter: Payer: Self-pay | Admitting: Physical Therapy

## 2019-01-24 DIAGNOSIS — M25551 Pain in right hip: Secondary | ICD-10-CM

## 2019-01-24 DIAGNOSIS — R262 Difficulty in walking, not elsewhere classified: Secondary | ICD-10-CM

## 2019-01-24 DIAGNOSIS — M25651 Stiffness of right hip, not elsewhere classified: Secondary | ICD-10-CM

## 2019-01-24 NOTE — Therapy (Signed)
[facility] [place], [place], [address] Phone: [phone]   Fax:  [phone]  Physical Therapy Treatment  Patient Details  Name: TARSHIA KOT MRN: [id number] Date of Birth: April 11, 1950 Referring Provider (PT): Jean Rosenthal, MD    Encounter Date: 01/24/2019  PT End of Session - 01/24/19 0855    Visit Number  2    Number of Visits  12    Date for PT Re-Evaluation  03/16/19    Authorization Type  FOTO, progress note every 10th visit, KX modifier at 15th visit    PT Start Time  0815    PT Stop Time  0902    PT Time Calculation (min)  47 min    Activity Tolerance  Patient tolerated treatment well    Behavior During Therapy  [facility] for tasks assessed/performed       Past Medical History:  Diagnosis Date  . Allergy   . Anemia   . Arthritis   . Asthma    as child only  . Back pain   . Diabetes mellitus without complication ([place])   . Hypertension   . Hypothyroidism     Past Surgical History:  Procedure Laterality Date  . ABDOMINAL HYSTERECTOMY  1989   fibroid-no longer needs pap  . [place]   DDD  . CARPAL TUNNEL RELEASE Left 05/28/2016   Procedure: CARPAL TUNNEL RELEASE;  Surgeon: Daryll Brod, MD;  Location: [facility];  Service: Orthopedics;  Laterality: Left;  . CARPAL TUNNEL RELEASE Right 03/17/2018   Procedure: CARPAL TUNNEL RELEASE;  Surgeon: Daryll Brod, MD;  Location: [facility];  Service: Orthopedics;  Laterality: Right;  . TOTAL HIP ARTHROPLASTY Right 11/18/2018   Procedure: RIGHT TOTAL HIP ARTHROPLASTY ANTERIOR APPROACH;  Surgeon: Mcarthur Rossetti, MD;  Location: [facility];  Service: Orthopedics;  Laterality: Right;  . TUBAL LIGATION      There were no vitals filed for this visit.  Subjective Assessment - 01/24/19 0853    Subjective  COVID-19 screening performed upon arrival. Patient arrives stating she has ongoing soreness upon standing. She was  out in the yard on Saturday and did a lot more bending than she liked as her cousin kept dropping the rake and making her pick it up.    Pertinent History  Right total hip replacement 11/18/2018; chronic back pain, HTN, DM    Limitations  House hold activities;Sitting    How long can you sit comfortably?  15 mins    How long can you stand comfortably?  15-20 mins    How long can you walk comfortably?  15-20 mins    Diagnostic tests  X-ray September 2018    Patient Stated Goals  walk without pain, sit without pain    Currently in Pain?  Yes    Pain Score  6     Pain Location  Hip    Pain Orientation  Right    Pain Descriptors / Indicators  Sore    Pain Type  Acute pain    Pain Onset  More than a month ago    Pain Frequency  Intermittent         [facility] PT Assessment - 01/24/19 0001      Assessment   Medical Diagnosis  History of total hip replacement, right    Referring Provider (PT)  Jean Rosenthal, MD    Onset Date/Surgical Date  11/18/18    Next MD Visit  02/01/2019  Prior Therapy  home health PT      Precautions   Precautions  Anterior Hip                   [facility] Adult PT Treatment/Exercise - 01/24/19 0001      Exercises   Exercises  Knee/Hip      Knee/Hip Exercises: Aerobic   Nustep  Level 3 x12 minutes seat 10      Knee/Hip Exercises: Standing   Hip Flexion  AROM;Both;2 sets;10 reps;Knee bent    Hip Abduction  AROM;Both;2 sets;10 reps;Knee straight    Hip Extension  AROM;Both;2 sets;10 reps;Knee straight    Rocker Board  3 minutes      Knee/Hip Exercises: Supine   Bridges  AROM;Both;2 sets;10 reps    Other Supine Knee/Hip Exercises  clams 2x10 red theraband      Modalities   Modalities  Cryotherapy;Electrical Stimulation      Cryotherapy   Number Minutes Cryotherapy  10 Minutes    Cryotherapy Location  Hip    Type of Cryotherapy  Ice pack      Electrical Stimulation   Electrical Stimulation Location  right anterior hip    Electrical  Stimulation Action  IFC    Electrical Stimulation Parameters  80-150 hz x10 mins    Electrical Stimulation Goals  Pain                  PT Long Term Goals - 01/19/19 0939      PT LONG TERM GOAL #1   Title  Patient will be independent with HEP.    Time  6    Period  Weeks    Status  New      PT LONG TERM GOAL #2   Title  Patient will report ability to sit for 30 minutes or greater with right hip pain and soreness less than or equal to 3/10    Time  6    Period  Weeks    Status  New      PT LONG TERM GOAL #3   Title  Patient will demonstrate 4+/5 or greater right hip MMT to improve stability during functional tasks.    Time  6    Period  Weeks    Status  New      PT LONG TERM GOAL #4   Title  Patient will report ability to [employer] community distances with no AD and pain less than or equal to 3/10    Time  6    Period  Weeks    Status  New      PT LONG TERM GOAL #5   Title  Patient will demonstrate reciprocal stair negotiation with one rail to safely enter and exit her home.    Time  6    Period  Weeks    Status  New            Plan - 01/24/19 0856    Clinical Impression Statement  Patient responded well to therapy but with ongoing pain particularly with sit to stands. Patient demonstrated good form with all exercises after explanation and demonstration. Patient and PT discussed scar massaging and mobilization to help improve scar mobility. Normal response to modalities upon removal.    Personal Factors and Comorbidities  Age;Comorbidity 2    Comorbidities  HTN, DM, R anterior THA 11/18/2018    Examination-Activity Limitations  Transfers;Stand    Stability/Clinical Decision Making  Evolving/Moderate complexity    Clinical  Decision Making  Low    Rehab Potential  Good    PT Frequency  2x / week    PT Duration  8 weeks    PT Treatment/Interventions  ADLs/Self Care Home Management;Dry needling;Taping;Cryotherapy;Electrical Stimulation;Iontophoresis 4mg /ml  Dexamethasone;Moist Heat;Ultrasound;Traction;Balance training;Therapeutic exercise;Therapeutic activities;Functional mobility training;Stair training;Gait training;Patient/family education;Manual techniques;Passive range of motion    PT Next Visit Plan  Nustep, right LE stengthening, right hip stretching, modalities PRN for pain relief    Consulted and Agree with Plan of Care  Patient       Patient will benefit from skilled therapeutic intervention in order to improve the following deficits and impairments:  Abnormal gait, Pain, Decreased mobility, Decreased balance, Decreased range of motion, Impaired flexibility, Improper body mechanics, Difficulty walking, Decreased strength  Visit Diagnosis: Pain in right hip  Stiffness of right hip, not elsewhere classified  Difficulty in walking, not elsewhere classified     Problem List Patient Active Problem List   Diagnosis Date Noted  . Status post total replacement of right hip 11/18/2018  . Unilateral primary osteoarthritis, right hip 09/29/2018  . Bilateral primary osteoarthritis of hip 05/25/2018  . Spinal stenosis of lumbar region with neurogenic claudication 04/20/2018  . Impingement syndrome of right shoulder 04/20/2018  . Essential hypertension, benign 02/20/2016  . Elevated blood pressure reading 09/29/2011  . Chronic back pain 09/29/2011  . Leg edema 09/29/2011  . Obesity 09/29/2011    Guss BundeKrystle Jarrad Mclees, PT, DPT 01/24/2019, 9:15 AM  [facility] [address] [place], [place], [address] Phone: [phone]   Fax:  [phone]  Name: Audria NineVirginia A Hoard MRN: [id number] Date of Birth: 08/16/1950

## 2019-01-26 ENCOUNTER — Encounter: Payer: Self-pay | Admitting: Physical Therapy

## 2019-01-26 ENCOUNTER — Other Ambulatory Visit: Payer: Self-pay

## 2019-01-26 ENCOUNTER — Ambulatory Visit: Payer: Medicare HMO | Admitting: Physical Therapy

## 2019-01-26 DIAGNOSIS — M25651 Stiffness of right hip, not elsewhere classified: Secondary | ICD-10-CM

## 2019-01-26 DIAGNOSIS — R262 Difficulty in walking, not elsewhere classified: Secondary | ICD-10-CM

## 2019-01-26 DIAGNOSIS — M25551 Pain in right hip: Secondary | ICD-10-CM | POA: Diagnosis not present

## 2019-01-26 NOTE — Therapy (Addendum)
[facility] [place], [place], [address] Phone: [phone]   Fax:  [phone]  Physical Therapy Treatment PHYSICAL THERAPY DISCHARGE SUMMARY  Visits from Start of Care: 3  Current functional level related to goals / functional outcomes: See below   Remaining deficits: See goals   Education / Equipment: HEP Plan: Patient agrees to discharge.  Patient goals were not met. Patient is being discharged due to not returning since the last visit.  ?????   Gabriela Eves, PT, DPT  Patient Details  Name: [name] [name] MRN: [id number] Date of Birth: 1950-10-22 Referring Provider (PT): Jean Rosenthal, MD   Encounter Date: 01/26/2019  PT End of Session - 01/26/19 0850    Visit Number  3    Number of Visits  12    Date for PT Re-Evaluation  03/16/19    Authorization Type  FOTO, progress note every 10th visit, KX modifier at 15th visit    PT Start Time  0815    PT Stop Time  0859    PT Time Calculation (min)  44 min    Activity Tolerance  Patient tolerated treatment well    Behavior During Therapy  [facility] for tasks assessed/performed       Past Medical History:  Diagnosis Date  . Allergy   . Anemia   . Arthritis   . Asthma    as child only  . Back pain   . Diabetes mellitus without complication ([place])   . Hypertension   . Hypothyroidism     Past Surgical History:  Procedure Laterality Date  . ABDOMINAL HYSTERECTOMY  1989   fibroid-no longer needs pap  . [place]   DDD  . CARPAL TUNNEL RELEASE Left 05/28/2016   Procedure: CARPAL TUNNEL RELEASE;  Surgeon: Daryll Brod, MD;  Location: [facility];  Service: Orthopedics;  Laterality: Left;  . CARPAL TUNNEL RELEASE Right 03/17/2018   Procedure: CARPAL TUNNEL RELEASE;  Surgeon: Daryll Brod, MD;  Location: [facility];  Service: Orthopedics;  Laterality: Right;  . TOTAL HIP ARTHROPLASTY Right 11/18/2018   Procedure:  RIGHT TOTAL HIP ARTHROPLASTY ANTERIOR APPROACH;  Surgeon: Mcarthur Rossetti, MD;  [facility];  Service: Orthopedics;  Laterality: Right;  . TUBAL LIGATION      There were no vitals filed for this visit.  Subjective Assessment - 01/26/19 0821    Subjective  COVID-19 screening performed upon arrival. Patient arrived with reported ongoing soreness and stiffness    Pertinent History  Right total hip replacement 11/18/2018; chronic back pain, HTN, DM    Limitations  House hold activities;Sitting    How long can you sit comfortably?  15 mins    How long can you stand comfortably?  15-20 mins    How long can you walk comfortably?  15-20 mins    Diagnostic tests  X-ray September 2018    Currently in Pain?  Yes    Pain Score  6     Pain Location  Hip    Pain Orientation  Right    Pain Descriptors / Indicators  Sore    Pain Type  Acute pain    Pain Onset  More than a month ago    Pain Frequency  Intermittent    Aggravating Factors   prolong sitting or standing    Pain Relieving Factors  meds and rest  [place] Adult PT Treatment/Exercise - 01/26/19 0001      Knee/Hip Exercises: Aerobic   Nustep  Level 3 x12 minutes seat 10      Knee/Hip Exercises: Standing   Hip Flexion  AROM;Both;2 sets;10 reps;Knee bent    Hip Abduction  AROM;Both;2 sets;10 reps;Knee straight    Hip Extension  AROM;Both;2 sets;10 reps;Knee straight    Lateral Step Up  Right;2 sets;10 reps;Step Height: 4"    Forward Step Up  Right;Step Height: 4";10 reps;2 sets    Rocker Board  3 minutes      Knee/Hip Exercises: Supine   Bridges  AROM;Both;2 sets;10 reps    Straight Leg Raises  Strengthening;Right;2 sets;10 reps;Both      Cryotherapy   Number Minutes Cryotherapy  10 Minutes    Cryotherapy Location  Hip    Type of Cryotherapy  Ice pack      Electrical Stimulation   Electrical Stimulation Location  right anterior hip    Electrical Stimulation Action  IFC     Electrical Stimulation Parameters  80-150hzx69mn    Electrical Stimulation Goals  Pain                  PT Long Term Goals - 01/26/19 0829      PT LONG TERM GOAL #1   Title  Patient will be independent with HEP.    Time  6    Period  Weeks    Status  On-going      PT LONG TERM GOAL #2   Title  Patient will report ability to sit for 30 minutes or greater with right hip pain and soreness less than or equal to 3/10    Time  6    Period  Weeks    Status  On-going      PT LONG TERM GOAL #3   Title  Patient will demonstrate 4+/5 or greater right hip MMT to improve stability during functional tasks.    Time  6    Period  Weeks    Status  On-going      PT LONG TERM GOAL #4   Title  Patient will report ability to amublate community distances with no AD and pain less than or equal to 3/10    Period  Weeks    Status  On-going      PT LONG TERM GOAL #5   Title  Patient will demonstrate reciprocal stair negotiation with one rail to safely enter and exit her home.    Time  6    Period  Weeks    Status  On-going            Plan - 01/26/19 07510   Clinical Impression Statement  Patient tolerated treatment well today. Patient able to progress with exercises today with no increased discomfort. Patient has reported more pain with prolong positions which causes stiffness and pain that eases off after movement. Patient responded well upon removal of modalities. Current goals ongoing at this time. Slight antalgic gait today using SPC for assist device.    Personal Factors and Comorbidities  Age;Comorbidity 2    Comorbidities  HTN, DM, R anterior THA 11/18/2018    Examination-Activity Limitations  Transfers;Stand    Stability/Clinical Decision Making  Evolving/Moderate complexity    Rehab Potential  Good    PT Frequency  2x / week    PT Duration  8 weeks    PT Treatment/Interventions  ADLs/Self Care Home Management;Dry needling;Taping;Cryotherapy;Electrical  Stimulation;Iontophoresis 435mml  Dexamethasone;Moist Heat;Ultrasound;Traction;Balance training;Therapeutic exercise;Therapeutic activities;Functional mobility training;Stair training;Gait training;Patient/family education;Manual techniques;Passive range of motion    PT Next Visit Plan  cont with POC forNustep, right LE stengthening, right hip stretching, modalities PRN for pain relief    Consulted and Agree with Plan of Care  Patient       Patient will benefit from skilled therapeutic intervention in order to improve the following deficits and impairments:  Abnormal gait, Pain, Decreased mobility, Decreased balance, Decreased range of motion, Impaired flexibility, Improper body mechanics, Difficulty walking, Decreased strength  Visit Diagnosis: Pain in right hip  Stiffness of right hip, not elsewhere classified  Difficulty in walking, not elsewhere classified     Problem List Patient Active Problem List   Diagnosis Date Noted  . Status post total replacement of right hip 11/18/2018  . Unilateral primary osteoarthritis, right hip 09/29/2018  . Bilateral primary osteoarthritis of hip 05/25/2018  . Spinal stenosis of lumbar region with neurogenic claudication 04/20/2018  . Impingement syndrome of right shoulder 04/20/2018  . Essential hypertension, benign 02/20/2016  . Elevated blood pressure reading 09/29/2011  . Chronic back pain 09/29/2011  . Leg edema 09/29/2011  . Obesity 09/29/2011    Phillips Climes, PTA 01/26/2019, 9:01 AM  [facility] [place], [place], [address] Phone: [phone]   Fax:  [phone]  Name: DENEENE TARVER MRN: [id number] Date of Birth: [birth year]

## 2019-01-31 ENCOUNTER — Ambulatory Visit: Payer: Medicare HMO | Admitting: *Deleted

## 2019-02-01 ENCOUNTER — Ambulatory Visit: Payer: Medicare HMO | Admitting: Orthopaedic Surgery

## 2019-02-02 ENCOUNTER — Ambulatory Visit: Payer: Medicare HMO | Admitting: Orthopaedic Surgery

## 2019-02-02 ENCOUNTER — Encounter: Payer: Medicare HMO | Admitting: Physical Therapy

## 2019-02-07 ENCOUNTER — Telehealth: Payer: Self-pay | Admitting: Internal Medicine

## 2019-02-07 NOTE — Telephone Encounter (Signed)
Pt called

## 2019-02-08 ENCOUNTER — Ambulatory Visit (INDEPENDENT_AMBULATORY_CARE_PROVIDER_SITE_OTHER): Payer: Medicare HMO | Admitting: Orthopaedic Surgery

## 2019-02-08 ENCOUNTER — Ambulatory Visit (INDEPENDENT_AMBULATORY_CARE_PROVIDER_SITE_OTHER): Payer: Medicare HMO

## 2019-02-08 ENCOUNTER — Encounter: Payer: Self-pay | Admitting: Orthopaedic Surgery

## 2019-02-08 ENCOUNTER — Other Ambulatory Visit: Payer: Self-pay

## 2019-02-08 DIAGNOSIS — Z96641 Presence of right artificial hip joint: Secondary | ICD-10-CM

## 2019-02-08 NOTE — Progress Notes (Signed)
HPI: [name] [name] returns today 82 days status post right total hip arthroplasty.  She states overall she is doing well except for soreness.  She has started physical therapy unsure if this is helping.  She ambulates with a cane mostly whenever she first starts walking and carries a cane.  She is requesting new x-rays of her hip today.  Physical exam: Right hip fluid motion limited external and internal rotation.  Calf supple nontender.  Dorsiflexion plantarflexion ankle intact.  Able to transfer from a seated position to standing on her own.  Uses cane to first ambulate in the walks without the cane.  Radiographs: AP pelvis and lateral view of the right hip show well-seated right total hip arthroplasty without complicating features.  No acute fractures.  Both hips well located.  Significant narrowing of the left hip.  Impression: Status post right total hip arthroplasty 11/18/2018  Plan: Reassurance is given that the stiffness should dissipate with time.  She will continue work with physical therapy on range of motion strengthening hip.  Follow-up with [place] in 3 months no x-rays at that time unless clinically indicated.

## 2019-02-14 ENCOUNTER — Ambulatory Visit: Payer: Medicare HMO | Admitting: *Deleted

## 2019-02-28 ENCOUNTER — Telehealth: Payer: Self-pay | Admitting: Orthopaedic Surgery

## 2019-02-28 NOTE — Telephone Encounter (Signed)
Returned call to patient left message on voicemail to return call concerning scheduling an appointment per pt request for next week with Dr Ninfa Linden or Artis Delay

## 2019-03-06 ENCOUNTER — Other Ambulatory Visit: Payer: Self-pay

## 2019-03-06 ENCOUNTER — Ambulatory Visit (INDEPENDENT_AMBULATORY_CARE_PROVIDER_SITE_OTHER): Payer: Medicare HMO | Admitting: Orthopaedic Surgery

## 2019-03-06 ENCOUNTER — Encounter: Payer: Self-pay | Admitting: Orthopaedic Surgery

## 2019-03-06 DIAGNOSIS — M7061 Trochanteric bursitis, right hip: Secondary | ICD-10-CM | POA: Diagnosis not present

## 2019-03-06 DIAGNOSIS — M1612 Unilateral primary osteoarthritis, left hip: Secondary | ICD-10-CM | POA: Insufficient documentation

## 2019-03-06 DIAGNOSIS — Z96641 Presence of right artificial hip joint: Secondary | ICD-10-CM

## 2019-03-06 MED ORDER — METHYLPREDNISOLONE ACETATE 40 MG/ML IJ SUSP
40.0000 mg | INTRAMUSCULAR | Status: AC | PRN
  Administered 2019-03-06: 10:00:00 40 mg via INTRA_ARTICULAR

## 2019-03-06 MED ORDER — LIDOCAINE HCL 1 % IJ SOLN
3.0000 mL | INTRAMUSCULAR | Status: AC | PRN
  Administered 2019-03-06: 3 mL

## 2019-03-06 NOTE — Progress Notes (Signed)
Office Visit Note   Patient: [name] [name]           Date of Birth: [birth year]           MRN: [id number] Visit Date: 03/06/2019              Requested by: Harvest Forest, MD [address] [name] [name] [place],  [place] [address] PCP: Harvest Forest, MD   Assessment & Plan: Visit Diagnoses:  1. Status post right hip replacement   2. Unilateral primary osteoarthritis, left hip   3. Trochanteric bursitis, right hip     Plan: Since she is far enough out from her initial right hip replacement I did recommend a trochanteric steroid injection of the right hip.  And she tolerated this well.  She understands the risk and limits of injections.  I do feel that her stiffness on the right side will improve with time but eventually she will need to have a left hip replacement because this would balance her better given the severity of arthritis on her left hip.  This could also help her posture and her back.  She understands this fully.  All question concerns were answered and addressed.  We will see her back in 3 months to see how she is doing but no x-rays are needed.  Follow-Up Instructions: Return in about 3 months (around 06/04/2019).   Orders:  Orders Placed This Encounter  Procedures  . Large Joint Inj   No orders of the defined types were placed in this encounter.     Procedures: Large Joint Inj: R greater trochanter on 03/06/2019 9:50 AM Indications: pain and diagnostic evaluation Details: 22 G 1.5 in needle, lateral approach  Arthrogram: No  Medications: 3 mL lidocaine 1 %; 40 mg methylPREDNISolone acetate 40 MG/ML Outcome: tolerated well, no immediate complications Procedure, treatment alternatives, risks and benefits explained, specific risks discussed. Consent was given by the patient. Immediately prior to procedure a time out was called to verify the correct patient, procedure, equipment, support staff and site/side marked as required. Patient was prepped and draped  in the usual sterile fashion.       Clinical Data: No additional findings.   Subjective: Chief Complaint  Patient presents with  . Right Hip - Pain  . Right Leg - Pain  The patient comes in today with continued right hip pain but it is more of the trochanteric area.  She had a right total hip arthroplasty done on August 14 of this year.  She does have severe arthritis in her left hip and degenerative changes in her lumbar spine to contribute to significant pain in her body in general.  She says it is hard to lay on her right side.  She denies any groin pain.  Surgery was successful.  X-rays from earlier this month of her right hip showed a well-seated implant on the right side with no complicating features.  She does have end-stage arthritis of her left hip.  She still walking with a considerable limp.  HPI  Review of Systems She currently denies any headache, chest pain, shortness of breath, fever, chills, nausea, vomiting  Objective: Vital Signs: There were no vitals taken for this visit.  Physical Exam She is alert and orient x3 and in no acute distress Ortho Exam Examination of her right hip shows that it is moving more fluidly.  Her left hip is very stiff with internal and external rotation.  She is tender to palpation  over the greater trochanteric area on the right side. Specialty Comments:  No specialty comments available.  Imaging: No results found.   PMFS History: Patient Active Problem List   Diagnosis Date Noted  . Unilateral primary osteoarthritis, left hip 03/06/2019  . Status post total replacement of right hip 11/18/2018  . Unilateral primary osteoarthritis, right hip 09/29/2018  . Bilateral primary osteoarthritis of hip 05/25/2018  . Spinal stenosis of lumbar region with neurogenic claudication 04/20/2018  . Impingement syndrome of right shoulder 04/20/2018  . Essential hypertension, benign 02/20/2016  . Elevated blood pressure reading 09/29/2011  .  Chronic back pain 09/29/2011  . Leg edema 09/29/2011  . Obesity 09/29/2011   Past Medical History:  Diagnosis Date  . Allergy   . Anemia   . Arthritis   . Asthma    as child only  . Back pain   . Diabetes mellitus without complication ([place])   . Hypertension   . Hypothyroidism     Family History  Problem Relation Age of Onset  . Asthma Mother   . Cancer Mother        AML  . Leukemia Mother   . Heart disease Father   . Hypertension Father   . Kidney disease Father   . Breast cancer Maternal Aunt     Past Surgical History:  Procedure Laterality Date  . ABDOMINAL HYSTERECTOMY  1989   fibroid-no longer needs pap  . [place]   DDD  . CARPAL TUNNEL RELEASE Left 05/28/2016   Procedure: CARPAL TUNNEL RELEASE;  Surgeon: Daryll Brod, MD;  Location: [facility];  Service: Orthopedics;  Laterality: Left;  . CARPAL TUNNEL RELEASE Right 03/17/2018   Procedure: CARPAL TUNNEL RELEASE;  Surgeon: Daryll Brod, MD;  Location: [facility];  Service: Orthopedics;  Laterality: Right;  . TOTAL HIP ARTHROPLASTY Right 11/18/2018   Procedure: RIGHT TOTAL HIP ARTHROPLASTY ANTERIOR APPROACH;  Surgeon: Mcarthur Rossetti, MD;  Location[facility];  Service: Orthopedics;  Laterality: Right;  . TUBAL LIGATION     Social History   Occupational History  . Not on file  Tobacco Use  . Smoking status: Never Smoker  . Smokeless tobacco: Never Used  Substance and Sexual Activity  . Alcohol use: Yes    Comment: social  . Drug use: No  . Sexual activity: Not Currently

## 2019-03-27 ENCOUNTER — Other Ambulatory Visit: Payer: Self-pay | Admitting: Internal Medicine

## 2019-03-27 DIAGNOSIS — Z1231 Encounter for screening mammogram for malignant neoplasm of breast: Secondary | ICD-10-CM

## 2019-04-09 ENCOUNTER — Other Ambulatory Visit: Payer: Self-pay

## 2019-04-09 ENCOUNTER — Encounter (HOSPITAL_COMMUNITY): Payer: Self-pay

## 2019-04-09 ENCOUNTER — Emergency Department (HOSPITAL_COMMUNITY): Payer: Medicare HMO

## 2019-04-09 ENCOUNTER — Emergency Department (HOSPITAL_COMMUNITY)
Admission: EM | Admit: 2019-04-09 | Discharge: 2019-04-09 | Disposition: A | Discharge status: Home / Self Care | Payer: Medicare HMO | Attending: Emergency Medicine | Admitting: Emergency Medicine

## 2019-04-09 DIAGNOSIS — Z7984 Long term (current) use of oral hypoglycemic drugs: Secondary | ICD-10-CM | POA: Diagnosis not present

## 2019-04-09 DIAGNOSIS — M542 Cervicalgia: Secondary | ICD-10-CM | POA: Diagnosis present

## 2019-04-09 DIAGNOSIS — E039 Hypothyroidism, unspecified: Secondary | ICD-10-CM | POA: Insufficient documentation

## 2019-04-09 DIAGNOSIS — M62838 Other muscle spasm: Secondary | ICD-10-CM | POA: Diagnosis not present

## 2019-04-09 DIAGNOSIS — Z79899 Other long term (current) drug therapy: Secondary | ICD-10-CM | POA: Diagnosis not present

## 2019-04-09 DIAGNOSIS — R05 Cough: Secondary | ICD-10-CM | POA: Diagnosis not present

## 2019-04-09 DIAGNOSIS — I10 Essential (primary) hypertension: Secondary | ICD-10-CM | POA: Insufficient documentation

## 2019-04-09 DIAGNOSIS — Z20822 Contact with and (suspected) exposure to covid-19: Secondary | ICD-10-CM | POA: Diagnosis not present

## 2019-04-09 DIAGNOSIS — Z7982 Long term (current) use of aspirin: Secondary | ICD-10-CM | POA: Insufficient documentation

## 2019-04-09 DIAGNOSIS — J069 Acute upper respiratory infection, unspecified: Secondary | ICD-10-CM | POA: Diagnosis not present

## 2019-04-09 DIAGNOSIS — E119 Type 2 diabetes mellitus without complications: Secondary | ICD-10-CM | POA: Insufficient documentation

## 2019-04-09 MED ORDER — BENZONATATE 100 MG PO CAPS: 100.0000 mg | ORAL_CAPSULE | Freq: Three times a day (TID) | ORAL | 0 refills | Status: DC

## 2019-04-09 MED ORDER — CYCLOBENZAPRINE HCL 10 MG PO TABS: 10.0000 mg | ORAL_TABLET | Freq: Every evening | ORAL | 0 refills | Status: DC | PRN

## 2019-04-09 NOTE — ED Provider Notes (Signed)
[facility] EMERGENCY DEPARTMENT Provider Note   CSN: [id number] Arrival date & time: 04/09/19  0841     History Chief Complaint  Patient presents with  . Neck Pain  . Cough    [name] [name] is a 69 y.o. female presenting to the emergency department with complaint of neck pain and cough.  Patient states she began having vaginal onset of left-sided neck pain on Friday.  Pain is located to the posterior neck and travels down between her scapula and spine.  Pain is worse with moving her head.  She is treated her symptoms with Tylenol and heating pad.  She also reports a cough productive of a yellow phlegm for about 1 week.  She is treated her symptoms with Mucinex DM.  She denies any other associated symptoms, including no fever, chills, shortness of breath, sore throat, congestion, changes in taste or smell.  No known sick contacts.  The history is provided by the patient.       Past Medical History:  Diagnosis Date  . Allergy   . Anemia   . Arthritis   . Asthma    as child only  . Back pain   . Diabetes mellitus without complication (HCC)   . Hypertension   . Hypothyroidism     Patient Active Problem List   Diagnosis Date Noted  . Unilateral primary osteoarthritis, left hip 03/06/2019  . Status post total replacement of right hip 11/18/2018  . Unilateral primary osteoarthritis, right hip 09/29/2018  . Bilateral primary osteoarthritis of hip 05/25/2018  . Spinal stenosis of lumbar region with neurogenic claudication 04/20/2018  . Impingement syndrome of right shoulder 04/20/2018  . Essential hypertension, benign 02/20/2016  . Elevated blood pressure reading 09/29/2011  . Chronic back pain 09/29/2011  . Leg edema 09/29/2011  . Obesity 09/29/2011    Past Surgical History:  Procedure Laterality Date  . ABDOMINAL HYSTERECTOMY  1989   fibroid-no longer needs pap  . BACK SURGERY  1987, 1985   DDD  . CARPAL TUNNEL RELEASE Left 05/28/2016   Procedure: CARPAL TUNNEL  RELEASE;  Surgeon: Cindee Salt, MD;  Location: [facility];  Service: Orthopedics;  Laterality: Left;  . CARPAL TUNNEL RELEASE Right 03/17/2018   Procedure: CARPAL TUNNEL RELEASE;  Surgeon: Cindee Salt, MD;  Location: [facility];  Service: Orthopedics;  Laterality: Right;  . TOTAL HIP ARTHROPLASTY Right 11/18/2018   Procedure: RIGHT TOTAL HIP ARTHROPLASTY ANTERIOR APPROACH;  Surgeon: Kathryne Hitch, MD;  Location: [facility];  Service: Orthopedics;  Laterality: Right;  . TUBAL LIGATION       OB History   No obstetric history on file.     Family History  Problem Relation Age of Onset  . Asthma Mother   . Cancer Mother        AML  . Leukemia Mother   . Heart disease Father   . Hypertension Father   . Kidney disease Father   . Breast cancer Maternal Aunt     Social History   Tobacco Use  . Smoking status: Never Smoker  . Smokeless tobacco: Never Used  Substance Use Topics  . Alcohol use: Yes    Comment: social  . Drug use: No    Home Medications Prior to Admission medications   Medication Sig Start Date End Date Taking? Authorizing Provider  amLODipine (NORVASC) 5 MG tablet Take 1 tablet (5 mg total) by mouth daily. 03/04/16   Dettinger, Elige Radon, MD  aspirin 81  MG chewable tablet Chew 1 tablet (81 mg total) by mouth 2 (two) times daily. 11/19/18   Mcarthur Rossetti, MD  atorvastatin (LIPITOR) 20 MG tablet Take 20 mg by mouth daily.  07/15/17   [provider]  benzonatate (TESSALON) 100 MG capsule Take 1 capsule (100 mg total) by mouth every 8 (eight) hours. 04/09/19   Reena Borromeo, [place] N, PA-C  Calcium Carb-Cholecalciferol (CALCIUM 600 + D PO) Take 1 tablet by mouth daily.    [provider]  cyclobenzaprine (FLEXERIL) 10 MG tablet Take 1 tablet (10 mg total) by mouth at bedtime as needed for muscle spasms. 04/09/19   Kayte Borchard, [place] N, PA-C  cyclobenzaprine (FLEXERIL) 5 MG tablet Please specify directions, refills and  quantity 11/21/18   Mcarthur Rossetti, MD  ibuprofen (ADVIL) 200 MG tablet Take 400 mg by mouth every 6 (six) hours as needed for moderate pain.  11/22/15   [provider]  levothyroxine (SYNTHROID, LEVOTHROID) 25 MCG tablet Take 25 mcg by mouth daily before breakfast.  07/20/17   [provider]  metFORMIN (GLUCOPHAGE) 500 MG tablet Take 500 mg by mouth 2 (two) times daily with a meal.     [provider]  oxyCODONE (OXY IR/ROXICODONE) 5 MG immediate release tablet Take 1-2 tablets (5-10 mg total) by mouth every 6 (six) hours as needed for moderate pain (pain score 4-6). 11/19/18   Mcarthur Rossetti, MD    Allergies    Flagyl [metronidazole]  Review of Systems   Review of Systems  Constitutional: Negative for fever.  Respiratory: Positive for cough. Negative for shortness of breath.   Musculoskeletal: Positive for neck pain.  All other systems reviewed and are negative.   Physical Exam Updated Vital Signs BP 118/73 (BP Location: Right Arm)   Pulse 87   Temp 98.6 F (37 C) (Oral)   Resp 20   Ht 5\' 8"  (1.727 m)   Wt 95.3 kg   SpO2 100%   BMI 31.93 kg/m   Physical Exam Vitals and nursing note reviewed.  Constitutional:      General: She is not in acute distress.    Appearance: She is well-developed. She is not ill-appearing.  HENT:     Head: Normocephalic and atraumatic.  Eyes:     Conjunctiva/sclera: Conjunctivae normal.  Cardiovascular:     Rate and Rhythm: Normal rate and regular rhythm.  Pulmonary:     Effort: Pulmonary effort is normal. No respiratory distress.     Breath sounds: Normal breath sounds.  Abdominal:     Palpations: Abdomen is soft.  Musculoskeletal:     Comments: Tenderness and palpable spasm to left paraspinal muscular of the C-spine, extending down through inferior trapezius muscle group bw scapula and spine. Nl ROM of neck and shoulder, though neck movement causes pain. No midline spinal TTP. No skin changes   Skin:    General: Skin is warm.  Neurological:     Mental Status: She is alert.  Psychiatric:        Behavior: Behavior normal.     ED Results / Procedures / Treatments   Labs (all labs ordered are listed, but only abnormal results are displayed) Labs Reviewed  NOVEL CORONAVIRUS, NAA (HOSP ORDER, SEND-OUT TO REF LAB; TAT 18-24 HRS)    EKG None  Radiology DG Chest 2 View  Result Date: 04/09/2019 CLINICAL DATA:  Cough EXAM: CHEST - 2 VIEW COMPARISON:  4/27/8 FINDINGS: Normal heart size. There is no pleural effusion identified. Mild chronic  appearing coarsened interstitial markings. No airspace consolidation or pulmonary edema. Chronic healed right anterior lower rib deformity. IMPRESSION: 1. No active cardiopulmonary abnormalities. 2. Chronic appearing interstitial coarsening. Electronically Signed   By: Signa Kell M.D.   On: 04/09/2019 09:57    Procedures Procedures (including critical care time)  Medications Ordered in ED Medications - No data to display  ED Course  I have reviewed the triage vital signs and the nursing notes.  Pertinent labs & imaging results that were available during my care of the patient were reviewed by me and considered in my medical decision making (see chart for details).    MDM Rules/Calculators/A&P                      [name] [name] was evaluated in Emergency Department on 04/09/2019 for the symptoms described in the history of present illness. She was evaluated in the context of the global COVID-19 pandemic, which necessitated consideration that the patient might be at risk for infection with the SARS-CoV-2 virus that causes COVID-19. Institutional protocols and algorithms that pertain to the evaluation of patients at risk for COVID-19 are in a state of rapid change based on information released by regulatory bodies including the CDC and federal and state organizations. These policies and algorithms were followed during the patient's care in  the ED.   Patients symptoms are consistent with URI, likely viral etiology. Pt also with neck pain and palpable spasm. Afebrile, tolerating secretions.  Lungs clear to auscultation bilaterally. CXR negative for acute infiltrate.  Discussed that antibiotics are not indicated for viral infections. Pt will be discharged with symptomatic treatment and instructions to f/u with PCP.  COVID test sent. Verbalizes understanding and is agreeable with plan. Pt is hemodynamically stable & in NAD prior to dc.  Discussed results, findings, treatment and follow up. Patient advised of return precautions. Patient verbalized understanding and agreed with plan.  Final Clinical Impression(s) / ED Diagnoses Final diagnoses:  Viral URI with cough  Neck muscle spasm    Rx / DC Orders ED Discharge Orders         Ordered    cyclobenzaprine (FLEXERIL) 10 MG tablet  At bedtime PRN     04/09/19 1142    benzonatate (TESSALON) 100 MG capsule  Every 8 hours     04/09/19 1142           Anastasios Melander, [place] N, [place] 04/09/19 1143    Maia Plan, MD 04/09/19 1849

## 2019-04-09 NOTE — ED Triage Notes (Signed)
Pt reports catch in her neck since Friday.  Denies injury.  Pt says hurts to move her head.  Reports productive cough since last Friday and has been taking mucinex dm.  Denies other symptoms.

## 2019-04-09 NOTE — Discharge Instructions (Addendum)
Please read instructions below.  You can take tylenol or ibuprofen as needed for pain. Drink plenty of water.  Use saline nasal spray for congestion. Follow up with your primary care provider.  You can take tessalon every 8 hours for cough. You can take flexeril at bedtime for muscle spasm. Return to the ER for inability to swallow liquids, difficulty breathing, or new or worsening symptoms.

## 2019-04-10 LAB — NOVEL CORONAVIRUS, NAA (HOSP ORDER, SEND-OUT TO REF LAB; TAT 18-24 HRS): SARS-CoV-2, NAA: NOT DETECTED

## 2019-04-11 ENCOUNTER — Telehealth: Payer: Self-pay | Admitting: Internal Medicine

## 2019-04-11 NOTE — Telephone Encounter (Signed)
Patient called in ands received her negative covid test result

## 2019-05-10 ENCOUNTER — Ambulatory Visit (INDEPENDENT_AMBULATORY_CARE_PROVIDER_SITE_OTHER): Payer: 59 | Admitting: Orthopaedic Surgery

## 2019-05-10 ENCOUNTER — Other Ambulatory Visit: Payer: Self-pay

## 2019-05-10 ENCOUNTER — Encounter: Payer: Self-pay | Admitting: Orthopaedic Surgery

## 2019-05-10 DIAGNOSIS — Z96641 Presence of right artificial hip joint: Secondary | ICD-10-CM | POA: Diagnosis not present

## 2019-05-10 MED ORDER — CELECOXIB 200 MG PO CAPS: 200.0000 mg | ORAL_CAPSULE | Freq: Every day | ORAL | 1 refills | Status: DC

## 2019-05-10 NOTE — Progress Notes (Signed)
Office Visit Note   Patient: [name] [name]           Date of Birth: 03-Sep-1950           MRN: [id number] Visit Date: 05/10/2019              Requested by: Harvest Forest, MD [address] [name],  [place] [address] PCP: Harvest Forest, MD   Assessment & Plan: Visit Diagnoses:  1. Status post right hip replacement     Plan: Recommend she avoid abduction exercises as shown by therapy.  She will work on IT band stretching exercises which I shown her today and had her demonstrate.  Would not recommend repeat trochanteric injection at this time due to the fact that is been just over 2 months.  However due to the fact that she did get some good relief with this sometime early March we could do a repeat injection if she continues have pain lateral aspect of the hip.  Otherwise we will see her back in 3 months see how she is progressing.  Questions were encouraged and answered.  Placed her on Celebrex 200 mg once daily.  Discontinue Advil.  Follow-Up Instructions: Return in about 3 months (around 08/07/2019).   Orders:  No orders of the defined types were placed in this encounter.  No orders of the defined types were placed in this encounter.     Procedures: No procedures performed   Clinical Data: No additional findings.   Subjective: Chief Complaint  Patient presents with  . Right Hip - Follow-up    HPI  [name] [name] returns today 5 months 20 days status post right total hip arthroplasty.  She states she has a pushing sensation in the groin area also pain lateral aspect of her hip.  She did not lose therapy is painful.  She no longer using a cane.  She notes that whenever she first begins to ambulate has.  Lateral aspect hip and some stiffness Ms. walks out after few seconds.  She is taking Advil for the pain.  She reports that she has been taken off of her Metformin is now taking 70 tablets due to the fact that her glucose levels are under such good control.   She is taking no pain medications or muscle relaxants.  No new injury to the right hip.  Review of Systems  Constitutional: Negative for chills and fever.  Respiratory: Negative for cough and shortness of breath.   Musculoskeletal: Positive for arthralgias.     Objective: Vital Signs: There were no vitals taken for this visit.  Physical Exam Constitutional:      General: She is not in acute distress.    Appearance: She is not ill-appearing or diaphoretic.  Pulmonary:     Effort: Pulmonary effort is normal.  Neurological:     Mental Status: She is alert and oriented to person, place, and time.  Psychiatric:        Behavior: Behavior normal.     Ortho Exam Right hip good range of motion slight decrease with internal and external rotation but no significant pain.  She has tenderness over the right trochanteric region.  She ambulates without any assistive device.  She is able to get on and off the exam table on her own. Specialty Comments:  No specialty comments available.  Imaging: No results found.   PMFS History: Patient Active Problem List   Diagnosis Date Noted  . Unilateral primary osteoarthritis,  left hip 03/06/2019  . Status post total replacement of right hip 11/18/2018  . Unilateral primary osteoarthritis, right hip 09/29/2018  . Bilateral primary osteoarthritis of hip 05/25/2018  . Spinal stenosis of lumbar region with neurogenic claudication 04/20/2018  . Impingement syndrome of right shoulder 04/20/2018  . Essential hypertension, benign 02/20/2016  . Elevated blood pressure reading 09/29/2011  . Chronic back pain 09/29/2011  . Leg edema 09/29/2011  . Obesity 09/29/2011   Past Medical History:  Diagnosis Date  . Allergy   . Anemia   . Arthritis   . Asthma    as child only  . Back pain   . Diabetes mellitus without complication ([place])   . Hypertension   . Hypothyroidism     Family History  Problem Relation Age of Onset  . Asthma Mother   . Cancer  Mother        AML  . Leukemia Mother   . Heart disease Father   . Hypertension Father   . Kidney disease Father   . Breast cancer Maternal Aunt     Past Surgical History:  Procedure Laterality Date  . ABDOMINAL HYSTERECTOMY  1989   fibroid-no longer needs pap  . [place]   DDD  . CARPAL TUNNEL RELEASE Left 05/28/2016   Procedure: CARPAL TUNNEL RELEASE;  Surgeon: Daryll Brod, MD;  Location: [place];  Service: Orthopedics;  Laterality: Left;  . CARPAL TUNNEL RELEASE Right 03/17/2018   Procedure: CARPAL TUNNEL RELEASE;  Surgeon: Daryll Brod, MD;  Location: [place] [facility];  Service: Orthopedics;  Laterality: Right;  . TOTAL HIP ARTHROPLASTY Right 11/18/2018   Procedure: RIGHT TOTAL HIP ARTHROPLASTY ANTERIOR APPROACH;  Surgeon: Mcarthur Rossetti, MD;  Location: [facility];  Service: Orthopedics;  Laterality: Right;  . TUBAL LIGATION     Social History   Occupational History  . Not on file  Tobacco Use  . Smoking status: Never Smoker  . Smokeless tobacco: Never Used  Substance and Sexual Activity  . Alcohol use: Yes    Comment: social  . Drug use: No  . Sexual activity: Not Currently

## 2019-05-11 ENCOUNTER — Ambulatory Visit: Payer: 59

## 2019-05-11 ENCOUNTER — Ambulatory Visit: Payer: Medicare HMO

## 2019-05-22 ENCOUNTER — Telehealth: Payer: Self-pay | Admitting: Orthopaedic Surgery

## 2019-05-22 NOTE — Telephone Encounter (Signed)
She is only 6 months out from surgery so MRI would not show [place] anything other than her hip replacement.  Usually if someone still having problems getting up and moving and having pain on the side of their hip, it is related to hip bursitis.  I would first recommend a course of outpatient physical therapy on her right hip so they can get it stronger and feeling better.  Try to set this up for her first.

## 2019-05-22 NOTE — Telephone Encounter (Signed)
Please advise 

## 2019-05-22 NOTE — Telephone Encounter (Signed)
Patient called asked if she can get set up for an MRI on her hip. Patient said she is having problem getting up and can not lay on the hip. The number to contact patient is [phone]

## 2019-05-23 NOTE — Telephone Encounter (Signed)
If it will make her happy, order a MRI to look at her hip replacement.  The sizes are not wrong and we make every effort before surgery and during surgery to put in the exact size for her making specific measurement.  Some people just unfortunately hurt for longer and there is nothing we can do but re-assure them that it will get better with time.  She can always go to someone lese for a second opinion as well if she would like to.

## 2019-05-23 NOTE — Telephone Encounter (Signed)
You may want to call her, she is really frustrated stating she has already has physical therapy. She states the celebrex is doing nothing. She states she is afraid the hip was the wrong size and she is in a lot of pain

## 2019-05-24 ENCOUNTER — Other Ambulatory Visit: Payer: Self-pay

## 2019-05-24 DIAGNOSIS — Z96641 Presence of right artificial hip joint: Secondary | ICD-10-CM

## 2019-05-24 NOTE — Telephone Encounter (Signed)
Patient aware that we are ordering an MRI for her

## 2019-06-05 ENCOUNTER — Ambulatory Visit: Payer: Medicare HMO

## 2019-06-08 ENCOUNTER — Other Ambulatory Visit: Payer: Self-pay

## 2019-06-08 ENCOUNTER — Ambulatory Visit
Admission: RE | Admit: 2019-06-08 | Discharge: 2019-06-08 | Disposition: A | Discharge status: Home / Self Care | Payer: 59 | Source: Ambulatory Visit | Attending: Internal Medicine | Admitting: Internal Medicine

## 2019-06-08 DIAGNOSIS — Z1231 Encounter for screening mammogram for malignant neoplasm of breast: Secondary | ICD-10-CM

## 2019-06-27 ENCOUNTER — Other Ambulatory Visit: Payer: 59

## 2019-07-10 ENCOUNTER — Ambulatory Visit: Payer: 59 | Admitting: Orthopaedic Surgery

## 2019-07-13 ENCOUNTER — Telehealth: Payer: Self-pay

## 2019-07-13 ENCOUNTER — Other Ambulatory Visit: Payer: Self-pay | Admitting: Physician Assistant

## 2019-07-13 MED ORDER — CELECOXIB 200 MG PO CAPS: 200.0000 mg | ORAL_CAPSULE | Freq: Every day | ORAL | 1 refills | Status: DC

## 2019-07-13 NOTE — Telephone Encounter (Signed)
Patient would like a Rf on Celebrex.

## 2019-07-13 NOTE — Telephone Encounter (Signed)
Sent in

## 2019-07-14 NOTE — Telephone Encounter (Signed)
Patient notified

## 2019-07-27 ENCOUNTER — Other Ambulatory Visit: Payer: Self-pay

## 2019-07-27 ENCOUNTER — Ambulatory Visit
Admission: RE | Admit: 2019-07-27 | Discharge: 2019-07-27 | Disposition: A | Discharge status: Home / Self Care | Payer: 59 | Source: Ambulatory Visit | Attending: Orthopaedic Surgery | Admitting: Orthopaedic Surgery

## 2019-07-27 DIAGNOSIS — Z96641 Presence of right artificial hip joint: Secondary | ICD-10-CM

## 2019-07-31 ENCOUNTER — Ambulatory Visit: Payer: 59 | Admitting: Orthopaedic Surgery

## 2019-08-07 ENCOUNTER — Other Ambulatory Visit: Payer: Self-pay

## 2019-08-07 ENCOUNTER — Ambulatory Visit (INDEPENDENT_AMBULATORY_CARE_PROVIDER_SITE_OTHER): Payer: 59 | Admitting: Orthopaedic Surgery

## 2019-08-07 ENCOUNTER — Encounter: Payer: Self-pay | Admitting: Orthopaedic Surgery

## 2019-08-07 DIAGNOSIS — Z96641 Presence of right artificial hip joint: Secondary | ICD-10-CM

## 2019-08-07 DIAGNOSIS — M25552 Pain in left hip: Secondary | ICD-10-CM

## 2019-08-07 DIAGNOSIS — M25551 Pain in right hip: Secondary | ICD-10-CM

## 2019-08-07 NOTE — Progress Notes (Signed)
The patient comes in today to go over an MRI of her right hip.  She has been dealing with right hip pain since we replaced her hip in August of last year.  She was about 9 months since her surgery.  She does have known severe stenosis at L3-L4 mainly to the right this was from an MRI in 2019.  She also has known osteoarthritis of her left hip.  Most of her pain though has been in the groin on this right side.  It hurts when she first stands up and sometimes with pivoting of the hip.  On inspection of her hip incision it looks good.  I can put her left and right hip through through range of motion and there is some pain in the groin.  MRI of the right hip and the plain films showed no worrisome features the implant itself.  There is no fluid collection or evidence of loosening or other complicating features.  At this point she is requesting physical therapy at least 2 days a week and I think this is a reasonable request.  We can have therapy work on strengthening her right hip as well as looking at her left hip and her low back and see if there is any modalities that we can do to improve her mobility and her strength.  I will see her back myself in about 6 weeks.  At that visit I would like a standing low AP pelvis and lateral of her right operative hip.

## 2019-08-24 ENCOUNTER — Ambulatory Visit: Payer: 59 | Admitting: Physical Therapy

## 2019-08-31 ENCOUNTER — Other Ambulatory Visit: Payer: Self-pay | Admitting: Physician Assistant

## 2019-09-01 NOTE — Telephone Encounter (Signed)
Left voice mail

## 2019-09-05 ENCOUNTER — Ambulatory Visit: Payer: 59

## 2019-09-14 ENCOUNTER — Ambulatory Visit: Payer: 59 | Attending: Orthopaedic Surgery | Admitting: Physical Therapy

## 2019-09-14 ENCOUNTER — Other Ambulatory Visit: Payer: Self-pay

## 2019-09-14 DIAGNOSIS — M25552 Pain in left hip: Secondary | ICD-10-CM | POA: Diagnosis present

## 2019-09-14 DIAGNOSIS — R262 Difficulty in walking, not elsewhere classified: Secondary | ICD-10-CM | POA: Diagnosis present

## 2019-09-14 DIAGNOSIS — M25651 Stiffness of right hip, not elsewhere classified: Secondary | ICD-10-CM | POA: Diagnosis present

## 2019-09-14 DIAGNOSIS — M25551 Pain in right hip: Secondary | ICD-10-CM | POA: Diagnosis present

## 2019-09-14 DIAGNOSIS — M25652 Stiffness of left hip, not elsewhere classified: Secondary | ICD-10-CM | POA: Insufficient documentation

## 2019-09-14 NOTE — Therapy (Addendum)
[place], [place], [address] Phone: [phone]   Fax:  [phone]  Physical Therapy Evaluation and Discharge  Patient Details  Name: [name] [name] MRN: [id number] Date of Birth: 1950/07/20 Referring Provider (PT): Mcarthur Rossetti, MD   No action required; pt requesting to cancel all future appointments and to d/c from PT.    Encounter Date: 09/14/2019   PT End of Session - 09/14/19 1004    Visit Number 1    Number of Visits 16    Date for PT Re-Evaluation 11/09/19    PT Start Time 1005    PT Stop Time 1054    PT Time Calculation (min) 49 min    Activity Tolerance Patient tolerated treatment well    Behavior During Therapy WFL for tasks assessed/performed           Past Medical History:  Diagnosis Date  . Allergy   . Anemia   . Arthritis   . Asthma    as child only  . Back pain   . Diabetes mellitus without complication ([place])   . Hypertension   . Hypothyroidism     Past Surgical History:  Procedure Laterality Date  . ABDOMINAL HYSTERECTOMY  1989   fibroid-no longer needs pap  . [place]   DDD  . CARPAL TUNNEL RELEASE Left 05/28/2016   Procedure: CARPAL TUNNEL RELEASE;  Surgeon: Daryll Brod, MD;  Location: [facility];  Service: Orthopedics;  Laterality: Left;  . CARPAL TUNNEL RELEASE Right 03/17/2018   Procedure: CARPAL TUNNEL RELEASE;  Surgeon: Daryll Brod, MD;  Location: [facility];  Service: Orthopedics;  Laterality: Right;  . TOTAL HIP ARTHROPLASTY Right 11/18/2018   Procedure: RIGHT TOTAL HIP ARTHROPLASTY ANTERIOR APPROACH;  Surgeon: Mcarthur Rossetti, MD;  Location: [facility];  Service: Orthopedics;  Laterality: Right;  . TUBAL LIGATION      There were no vitals filed for this visit.    Subjective Assessment - 09/14/19 1007    Subjective Pt reports she had hip replacement on Aug 2020. Since then she reports R side groin  pain with SLS and L buttock pain for shower transfers. R knee issues also exacerbates pt's problems. Pt had in home therapy for 6 weeks and got extended for 6 week. She went to OPPT after but then stopped due to not liking the clinic.    Pertinent History Right total hip replacement 11/18/2018; chronic back pain, HTN, DM    How long can you sit comfortably? 15 min    How long can you stand comfortably? 1 hr or 2 limited ude to back pain    Patient Stated Goals sit without pain, improve pain with shower transfers    Currently in Pain? Yes    Pain Score 4     Pain Location Hip    Pain Orientation Left    Pain Onset More than a month ago              [facility] PT Assessment - 09/14/19 0001      Assessment   Medical Diagnosis M25.551,M25.552 (ICD-10-CM) - Bilateral hip pain    Referring Provider (PT) Mcarthur Rossetti, MD    Onset Date/Surgical Date 11/18/18    Prior Therapy HHPT and OPPT      Precautions   Precautions Anterior Hip      Balance Screen   Has the patient fallen in the past 6 months No  [place] residence    Living Arrangements Alone    Available Help at Discharge Family    Type of [place] to enter    Entrance Stairs-Number of Steps [place] One level      Prior Function   Level of [place] work   elderly care     Observation/Other Assessments   Focus on Therapeutic Outcomes (FOTO)  55% limited      AROM   Right Hip Extension 20   Limited due to strength   Left Hip Extension 30   Limited due to strength   Lumbar Flexion WFL   Pull on left hip   Lumbar Extension WFL    Lumbar - Right Side Bend WFL    Lumbar - Left Side Bend [facility]      Strength   Right Hip Flexion 4/5    Right Hip Extension 3+/5    Right Hip ABduction 4-/5    Left Hip Flexion 4/5    Left Hip Extension 3+/5    Left Hip ABduction 4-/5      Flexibility   Hamstrings 160 deg  SLR bilat    Quadriceps 20 deg on L (Prone knee bend), 30 deg on R (prone knee bend)    Piriformis Limited L > R      Palpation   SI assessment  TTP      Special Tests   Leg length test  Apparent      FABER test   findings Positive    Side Right    Comment Back pain      Prone Knee Bend Test   Findings Negative    Comment No pain, just decreased flexibility noted      Straight Leg Raise   Findings Positive    Side  Right    Comment Anterior groin pain      Pelvic Dictraction   Findings Positive    Side  Right    Comment Feels in back      Pelvic Compression   Findings Negative      Sacral thrust    Findings Unable to test    Comments --   Pt cramping limiting testing     Gaenslen's test   Findings Positive    Side  Left      Apparent   Comments Supine: L shorter than R, long sit: L longer than R                      Objective measurements completed on examination: See above findings.       [facility] Adult PT Treatment/Exercise - 09/14/19 0001      Lumbar Exercises: Supine   Bridge 10 reps;Compliant      Knee/Hip Exercises: Stretches   Passive Hamstring Stretch 30 seconds;Both    Hip Flexor Stretch Both;30 seconds   in thomas position   Piriformis Stretch 2 reps;30 seconds;Both                  PT Education - 09/14/19 1056    Education Details Discussed possible SI involvement, back and hip pain. Discussed HEP    Person(s) Educated Patient    Methods Explanation;Demonstration;Handout    Comprehension Verbalized understanding;Returned demonstration            PT Short Term Goals - 09/14/19 1105  PT SHORT TERM GOAL #1   Title Pt will report decrease pain to 2/10 at rest    Baseline 4/10 upon presentation to clinic    Time 4    Period Weeks    Status New    Target Date 10/12/19             PT Long Term Goals - 09/14/19 1107      PT LONG TERM GOAL #1   Title Pt will be independent with HEP    Time 8    Period  Weeks    Status New    Target Date 11/09/19      PT LONG TERM GOAL #2   Title Pt will be able to sit for >30 minutes consistently with pain </=1/10    Baseline Only able to tolerate 15 minutes occasionally    Time 8    Period Weeks    Status New    Target Date 11/09/19      PT LONG TERM GOAL #3   Title Pt will have 5/5 bilateral hip strength for functional tasks    Baseline bilat hip ext 3+/5, hip abd 4-/5    Time 8    Period Weeks    Status New    Target Date 11/09/19      PT LONG TERM GOAL #4   Title Pt will be able to t/f with SLS in and out of shower with </=1/10 pain/discomfort    Baseline R anterior groin pain, L buttock pain with SLS when performing t/f    Time 8    Period Weeks    Status New    Target Date 11/09/19      PT LONG TERM GOAL #5   Title Pt will have improved FOTO score to 42% limitation    Baseline 55% limitation on FOTO    Time 8    Period Weeks    Status New    Target Date 11/09/19                  Plan - 09/14/19 1059    Clinical Impression Statement Pt is a 69 y/o F returning to [place] with c/o bilateral hip pain. Assessment found pt to have s/s consistent with posteriorly rotated L inominate vs R, with decreased bilateral hip and core strength, and decreased hamstring, piriformis and hip flexor flexibility affecting pt's ability to perform SLS for functional tasks such as transfers in/out of shower and prolonged standing for house work and community mobility. Pt would benefit from therapy to address these issues to maximize her level of function    Personal Factors and Comorbidities Age;Comorbidity 2    Comorbidities HTN, DM, R anterior THA 11/18/2018    Examination-Activity Limitations Transfers;Stand;Stairs;Sit    Examination-Participation Restrictions Art gallery manager;Yard Work    Biomedical scientist Low    Rehab Potential Good    PT Frequency 2x / week      PT Duration 8 weeks    PT Treatment/Interventions ADLs/Self Care Home Management;Dry needling;Taping;Cryotherapy;Electrical Stimulation;Iontophoresis 46m/ml Dexamethasone;Moist Heat;Ultrasound;Traction;Balance training;Therapeutic exercise;Therapeutic activities;Functional mobility training;Stair training;Gait training;Patient/family education;Manual techniques;Passive range of motion;Aquatic Therapy;Neuromuscular re-education    PT Next Visit Plan Assess response to HEP. Continue bilateral hip strengthening, core stabilization. Reassess for SI. Consider MET if indicated. Consider e-stim    PT Home Exercise Plan Access Code [email or web address]    Consulted and Agree with Plan of Care Patient  PHYSICAL THERAPY DISCHARGE SUMMARY  Visits from Start of Care: 0  Current functional level related to goals / functional outcomes: No change   Remaining deficits: See above   Education / Equipment: See above  Plan: Patient agrees to discharge.  Patient goals were not met. Patient is being discharged due to the patient's request.  Pt called front office and requested to cancel all future appointments. No change towards goals and no visits since initial eval.         Patient will benefit from skilled therapeutic intervention in order to improve the following deficits and impairments:  Abnormal gait, Pain, Decreased mobility, Decreased balance, Decreased range of motion, Impaired flexibility, Improper body mechanics, Difficulty walking, Decreased strength, Increased fascial restricitons, Decreased activity tolerance, Hypomobility, Postural dysfunction  Visit Diagnosis: Pain in right hip  Pain in left hip  Stiffness of left hip, not elsewhere classified  Stiffness of right hip, not elsewhere classified  Difficulty in walking, not elsewhere classified     Problem List Patient Active Problem List   Diagnosis Date Noted  . Unilateral primary osteoarthritis, left hip 03/06/2019  .  Status post total replacement of right hip 11/18/2018  . Unilateral primary osteoarthritis, right hip 09/29/2018  . Bilateral primary osteoarthritis of hip 05/25/2018  . Spinal stenosis of lumbar region with neurogenic claudication 04/20/2018  . Impingement syndrome of right shoulder 04/20/2018  . Essential hypertension, benign 02/20/2016  . Elevated blood pressure reading 09/29/2011  . Chronic back pain 09/29/2011  . Leg edema 09/29/2011  . Obesity 09/29/2011    [facility] April Ma L Teliah Buffalo PT, DPT 09/14/2019, 11:24 AM  [facility] [address] [place], [place], [address] Phone: [phone]   Fax:  [phone]  Name: CELINA SHILEY MRN: [id number] Date of Birth: 21-Mar-1951

## 2019-09-14 NOTE — Patient Instructions (Signed)
Access Code: [email or web address] URL: [email or web address] Date: 09/14/2019 Prepared by: Vernon Prey April Kirstie Peri  Exercises Seated Hamstring Stretch - 1 x daily - 7 x weekly - 2 sets - 1 reps - 30 sec hold Seated Piriformis Stretch - 1 x daily - 7 x weekly - 2 sets - 1 reps - 30 sec hold Supine Bridge - 1 x daily - 7 x weekly - 10 reps - 3 sets Modified Thomas Stretch - 1 x daily - 7 x weekly - 2 sets - 1 reps - 30 sec hold

## 2019-09-18 ENCOUNTER — Telehealth: Payer: Self-pay | Admitting: Physical Therapy

## 2019-09-18 NOTE — Telephone Encounter (Signed)
Pt request for PT to call her back due to scheduling.  Pt expressed concerns about the big lapse in her care between this week and next week as she wished for something more regular. She was hoping for 2x/wk and did not feel that such a big gap would be beneficial for her and requested to cancel all appointments all together if this were the case. PT discussed with pt that the clinic gets cancellations at times and asked if she would be willing to get called to fill in that slot. Pt states she would be agreeable to that.   Front office staff additionally notified of pt's request to be called in the case of cancellations to try and get her scheduled 2x this week.   April Nonato PT, DPT

## 2019-09-19 ENCOUNTER — Ambulatory Visit: Payer: 59 | Admitting: Physician Assistant

## 2019-09-28 ENCOUNTER — Encounter: Payer: Self-pay | Admitting: Emergency Medicine

## 2019-09-28 ENCOUNTER — Other Ambulatory Visit: Payer: Self-pay | Admitting: Physician Assistant

## 2019-09-28 ENCOUNTER — Ambulatory Visit: Payer: 59 | Admitting: Physical Therapy

## 2019-09-28 ENCOUNTER — Emergency Department (INDEPENDENT_AMBULATORY_CARE_PROVIDER_SITE_OTHER)
Admission: EM | Admit: 2019-09-28 | Discharge: 2019-09-28 | Disposition: A | Discharge status: Home / Self Care | Payer: 59 | Source: Home / Self Care

## 2019-09-28 ENCOUNTER — Other Ambulatory Visit: Payer: Self-pay

## 2019-09-28 DIAGNOSIS — M2550 Pain in unspecified joint: Secondary | ICD-10-CM | POA: Diagnosis not present

## 2019-09-28 DIAGNOSIS — D72829 Elevated white blood cell count, unspecified: Secondary | ICD-10-CM

## 2019-09-28 DIAGNOSIS — R252 Cramp and spasm: Secondary | ICD-10-CM

## 2019-09-28 DIAGNOSIS — R52 Pain, unspecified: Secondary | ICD-10-CM

## 2019-09-28 DIAGNOSIS — R358 Other polyuria: Secondary | ICD-10-CM

## 2019-09-28 DIAGNOSIS — R109 Unspecified abdominal pain: Secondary | ICD-10-CM

## 2019-09-28 DIAGNOSIS — R3589 Other polyuria: Secondary | ICD-10-CM

## 2019-09-28 LAB — POCT URINALYSIS DIP (MANUAL ENTRY)
Bilirubin, UA: NEGATIVE
Blood, UA: NEGATIVE
Glucose, UA: NEGATIVE mg/dL
Ketones, POC UA: NEGATIVE mg/dL
Leukocytes, UA: NEGATIVE
Nitrite, UA: NEGATIVE
Protein Ur, POC: NEGATIVE mg/dL
Spec Grav, UA: 1.025 (ref 1.010–1.025)
Urobilinogen, UA: 0.2 E.U./dL
pH, UA: 5.5 (ref 5.0–8.0)

## 2019-09-28 LAB — POCT FASTING CBG KUC MANUAL ENTRY: POCT Glucose (KUC): 98 mg/dL (ref 70–99)

## 2019-09-28 MED ORDER — METHYLPREDNISOLONE ACETATE 80 MG/ML IJ SUSP
80.0000 mg | Freq: Once | INTRAMUSCULAR | Status: AC
  Administered 2019-09-28: 80 mg via INTRAMUSCULAR

## 2019-09-28 MED ORDER — AMOXICILLIN-POT CLAVULANATE 875-125 MG PO TABS: 1.0000 | ORAL_TABLET | Freq: Two times a day (BID) | ORAL | 0 refills | Status: DC

## 2019-09-28 NOTE — ED Provider Notes (Signed)
[facility]    CSN: [id number] Arrival date & time: 09/28/19  1053      History   Chief Complaint Chief Complaint  Patient presents with  . Polyuria    HPI [name] is [name] 69 y.o. female.   HPI  [name] is [name] 69 y.o. female presenting to UC with c/o 1 week of polyuria, body aches including hand pain, leg cramps and lower abdominal cramping.  Pt does have [name] hx of chronic back pain but the muscle cramps in her legs, abdomen and joint pain in her hands is all new. She saw her PCP on 6/17, prescribed gabapentin and keflex for [name] [name] suspected UTI but was called by her PCP [name] few days later to stop taking her antibiotics as the culture did not show [name] UTI.  She has not received any pain relief from the gabapentin. Denies fever, chills, n/v/d. Body aches are 8/10.  Denies known hx of RA. No recent tick bites or exposure to possible ticks. No rashes.  No hx of kidney stones or diverticulitis.    Past Medical History:  Diagnosis Date  . Allergy   . Anemia   . Arthritis   . Asthma    as child only  . Back pain   . Diabetes mellitus without complication ([facility])   . Hypertension   . Hypothyroidism     Patient Active Problem List   Diagnosis Date Noted  . Unilateral primary osteoarthritis, left hip 03/06/2019  . Status post total replacement of right hip 11/18/2018  . Unilateral primary osteoarthritis, right hip 09/29/2018  . Bilateral primary osteoarthritis of hip 05/25/2018  . Spinal stenosis of lumbar region with neurogenic claudication 04/20/2018  . Impingement syndrome of right shoulder 04/20/2018  . Essential hypertension, benign 02/20/2016  . Elevated blood pressure reading 09/29/2011  . Chronic back pain 09/29/2011  . Leg edema 09/29/2011  . Obesity 09/29/2011    Past Surgical History:  Procedure Laterality Date  . ABDOMINAL HYSTERECTOMY  1989   fibroid-no longer needs pap  . BACK SURGERY  1987, 1985   DDD  . CARPAL TUNNEL RELEASE Left 05/28/2016    Procedure: CARPAL TUNNEL RELEASE;  Surgeon: Cindee Salt, MD;  Location: [facility];  Service: Orthopedics;  Laterality: Left;  . CARPAL TUNNEL RELEASE Right 03/17/2018   Procedure: CARPAL TUNNEL RELEASE;  Surgeon: Cindee Salt, MD;  Location: [facility];  Service: Orthopedics;  Laterality: Right;  . TOTAL HIP ARTHROPLASTY Right 11/18/2018   Procedure: RIGHT TOTAL HIP ARTHROPLASTY ANTERIOR APPROACH;  Surgeon: Kathryne Hitch, MD;  Location: [facility];  Service: Orthopedics;  Laterality: Right;  . TUBAL LIGATION      OB History   No obstetric history on file.      Home Medications    Prior to Admission medications   Medication Sig Start Date End Date Taking? Authorizing Provider  cephALEXin (KEFLEX) 500 MG capsule Take 500 mg by mouth 4 (four) times daily.   Yes [provider]  cyclobenzaprine (FLEXERIL) 5 MG tablet Take 5 mg by mouth 3 (three) times daily as needed for muscle spasms.   Yes [provider]  gabapentin (NEURONTIN) 100 MG capsule Take 100 mg by mouth 3 (three) times daily.   Yes [provider]  vitamin B-12 (CYANOCOBALAMIN) 100 MCG tablet Take 100 mcg by mouth daily.   Yes [provider]  amLODipine (NORVASC) 5 MG tablet Take 1 tablet (5 mg total) by mouth  daily. 03/04/16   Dettinger, Fransisca Kaufmann, MD  amoxicillin-clavulanate (AUGMENTIN) 875-125 MG tablet Take 1 tablet by mouth 2 (two) times daily. One po bid x 7 days 09/28/19   Noe Gens, PA-C  atorvastatin (LIPITOR) 20 MG tablet Take 20 mg by mouth daily.  07/15/17   [provider]  Calcium Carb-Cholecalciferol (CALCIUM 600 + D PO) Take 1 tablet by mouth daily.    [provider]  celecoxib (CELEBREX) 200 MG capsule TAKE 1 CAPSULE DAILY 09/28/19   Mcarthur Rossetti, MD  levothyroxine (SYNTHROID, LEVOTHROID) 25 MCG tablet Take 25 mcg by mouth daily before breakfast.  07/20/17   [provider]    Family History Family  History  Problem Relation Age of Onset  . Asthma Mother   . Cancer Mother        AML  . Leukemia Mother   . Heart disease Father   . Hypertension Father   . Kidney disease Father   . Breast cancer Maternal Aunt     Social History Social History   Tobacco Use  . Smoking status: Never Smoker  . Smokeless tobacco: Never Used  Vaping Use  . Vaping Use: Never used  Substance Use Topics  . Alcohol use: Yes    Comment: social  . Drug use: No     Allergies   Flagyl [metronidazole]   Review of Systems Review of Systems  Constitutional: Negative for chills and fever.  HENT: Negative for congestion, ear pain, sore throat, trouble swallowing and voice change.   Respiratory: Negative for cough and shortness of breath.   Cardiovascular: Negative for chest pain and palpitations.  Gastrointestinal: Positive for abdominal pain. Negative for diarrhea, nausea and vomiting.  Musculoskeletal: Negative for arthralgias, back pain and myalgias.  Skin: Negative for rash.  All other systems reviewed and are negative.    Physical Exam Triage Vital Signs ED Triage Vitals  Enc Vitals Group     BP 09/28/19 1122 126/80     Pulse Rate 09/28/19 1122 98     Resp 09/28/19 1128 18     Temp 09/28/19 1122 98.6 F (37 C)     Temp Source 09/28/19 1122 Oral     SpO2 09/28/19 1122 97 %     Weight 09/28/19 1129 206 lb (93.4 kg)     Height 09/28/19 1129 5\' 8"  (1.727 m)     Head Circumference --      Peak Flow --      Pain Score 09/28/19 1124 8     Pain Loc --      Pain Edu? --      Excl. in [place]? --    No data found.  Updated Vital Signs BP 126/80 (BP Location: Right Arm)   Pulse 98   Temp 98.6 F (37 C) (Oral)   Resp 18   Ht 5\' 8"  (1.727 m)   Wt 206 lb (93.4 kg)   SpO2 98%   BMI 31.32 kg/m   Visual Acuity Right Eye Distance:   Left Eye Distance:   Bilateral Distance:    Right Eye Near:   Left Eye Near:    Bilateral Near:     Physical Exam Vitals and nursing note reviewed.   Constitutional:      General: She is not in acute distress.    Appearance: Normal appearance. She is well-developed. She is not ill-appearing, toxic-appearing or diaphoretic.  HENT:     Head: Normocephalic and atraumatic.     Right  Ear: Tympanic membrane and ear canal normal.     Left Ear: Tympanic membrane and ear canal normal.     Mouth/Throat:     Mouth: Mucous membranes are moist.     Pharynx: Oropharynx is clear.  Cardiovascular:     Rate and Rhythm: Normal rate and regular rhythm.  Pulmonary:     Effort: Pulmonary effort is normal. No respiratory distress.     Breath sounds: Normal breath sounds. No stridor. No wheezing, rhonchi or rales.  Abdominal:     General: There is no distension.     Palpations: Abdomen is soft. There is no mass.     Tenderness: There is abdominal tenderness (minimal). There is no right CVA tenderness, left CVA tenderness, guarding or rebound.     Hernia: No hernia is present.  Musculoskeletal:        General: No swelling. Normal range of motion.     Cervical back: Normal range of motion.     Right lower leg: No edema.     Left lower leg: No edema.  Skin:    General: Skin is warm and dry.  Neurological:     Mental Status: She is alert and oriented to person, place, and time.  Psychiatric:        Behavior: Behavior normal.      UC Treatments / Results  Labs (all labs ordered are listed, but only abnormal results are displayed) Labs Reviewed  URINE CULTURE  COMPLETE METABOLIC PANEL WITH GFR  MAGNESIUM  CK  SEDIMENTATION RATE  C-REACTIVE PROTEIN  RHEUMATOID FACTOR  POCT URINALYSIS DIP (MANUAL ENTRY)  POCT FASTING CBG KUC MANUAL ENTRY  POCT CBC W AUTO DIFF ([facility])    EKG   Radiology No results found.  Procedures Procedures (including critical care time)  Medications Ordered in UC Medications  methylPREDNISolone acetate (DEPO-MEDROL) injection 80 mg (80 mg Intramuscular Given 09/28/19 1248)    Initial Impression  / Assessment and Plan / UC Course  I have reviewed the triage vital signs and the nursing notes.  Pertinent labs & imaging results that were available during my care of the patient were reviewed by me and considered in my medical decision making (see chart for details).     Pt c/o diffuse muscle cramps and joint pain as well as lower abdominal cramping. Minimal lower abdominal tenderness noted on exam. Abdomen is soft.  UA: unremarkable will check labs for potential RA or other inflammatory response. Depomedrol 80mg  IM given in UC  Pt does have elevated WBC- discussed pt with Dr. .  Discussed CT vs empiric tx of colitis with pt, will start pt on Augmentin, hold off on imaging at this time Encouraged close f/u with PCP by next week  Discussed symptoms that warrant emergent care in the ED. AVS given  Final Clinical Impressions(s) / UC Diagnoses   Final diagnoses:  Polyuria  Body aches  Cramps, muscle, general  Polyarthralgia  Leukocytosis, unspecified type  Abdominal cramping     Discharge Instructions      You may take 500mg  acetaminophen every 4-6 hours or in combination with ibuprofen 400-600mg  every 6-8 hours as needed for pain, inflammation, and fever.  Be sure to well hydrated with clear liquids and get at least 8 hours of sleep at night, preferably more while sick.   Please follow up with family medicine in 1 week if needed.  Call 911 or go to the hospital if symptoms worsening- worsening pain, fever, vomiting, blood in  stool, unable to keep down fluids, or other new concerning symptoms develop.     ED Prescriptions    Medication Sig Dispense Auth. Provider   amoxicillin-clavulanate (AUGMENTIN) 875-125 MG tablet Take 1 tablet by mouth 2 (two) times daily. One po bid x 7 days 14 tablet Lurene Shadow, [place]     PDMP not reviewed this encounter.   Lurene Shadow, [place] 09/28/19 1859

## 2019-09-28 NOTE — Discharge Instructions (Signed)
°  You may take 500mg  acetaminophen every 4-6 hours or in combination with ibuprofen 400-600mg  every 6-8 hours as needed for pain, inflammation, and fever.  Be sure to well hydrated with clear liquids and get at least 8 hours of sleep at night, preferably more while sick.   Please follow up with family medicine in 1 week if needed.  Call 911 or go to the hospital if symptoms worsening- worsening pain, fever, vomiting, blood in stool, unable to keep down fluids, or other new concerning symptoms develop.

## 2019-09-28 NOTE — ED Triage Notes (Addendum)
Polyuria x 1 week , body aches Saw her PCP on 6/17 and was rx'd Gabapentin and Keflex with no relief, was advised to come back next week if not better, but she said she was in too much pain to wait.

## 2019-09-29 LAB — COMPLETE METABOLIC PANEL WITH GFR
AG Ratio: 1.2 (calc) (ref 1.0–2.5)
ALT: 7 U/L (ref 6–29)
AST: 16 U/L (ref 10–35)
Albumin: 4.5 g/dL (ref 3.6–5.1)
Alkaline phosphatase (APISO): 54 U/L (ref 37–153)
BUN/Creatinine Ratio: 22 (calc) (ref 6–22)
BUN: 23 mg/dL (ref 7–25)
CO2: 26 mmol/L (ref 20–32)
Calcium: 10 mg/dL (ref 8.6–10.4)
Chloride: 101 mmol/L (ref 98–110)
Creat: 1.03 mg/dL — ABNORMAL HIGH (ref 0.50–0.99)
GFR, Est African American: 65 mL/min/{1.73_m2} (ref >=60)
GFR, Est Non African American: 56 mL/min/{1.73_m2} — ABNORMAL LOW (ref >=60)
Globulin: 3.7 g/dL (calc) (ref 1.9–3.7)
Glucose, Bld: 98 mg/dL (ref 65–99)
Potassium: 5.1 mmol/L (ref 3.5–5.3)
Sodium: 137 mmol/L (ref 135–146)
Total Bilirubin: 0.5 mg/dL (ref 0.2–1.2)
Total Protein: 8.2 g/dL — ABNORMAL HIGH (ref 6.1–8.1)

## 2019-09-29 LAB — POCT CBC W AUTO DIFF (K'VILLE URGENT CARE)

## 2019-09-29 LAB — CK: Total CK: 266 U/L — ABNORMAL HIGH (ref 29–143)

## 2019-09-29 LAB — URINE CULTURE
MICRO NUMBER:: 10633654
Result:: NO GROWTH
SPECIMEN QUALITY:: ADEQUATE

## 2019-09-29 LAB — SEDIMENTATION RATE: Sed Rate: 119 mm/h — ABNORMAL HIGH (ref 0–30)

## 2019-09-29 LAB — MAGNESIUM: Magnesium: 2.5 mg/dL (ref 1.5–2.5)

## 2019-09-29 LAB — RHEUMATOID FACTOR: Rheumatoid fact SerPl-aCnc: <14 IU/mL (ref <14)

## 2019-09-29 LAB — C-REACTIVE PROTEIN: CRP: 46.3 mg/L — ABNORMAL HIGH (ref <8.0)

## 2019-09-30 ENCOUNTER — Other Ambulatory Visit: Payer: Self-pay

## 2019-09-30 ENCOUNTER — Encounter (HOSPITAL_COMMUNITY): Payer: Self-pay | Admitting: Emergency Medicine

## 2019-09-30 ENCOUNTER — Emergency Department (HOSPITAL_COMMUNITY)
Admission: EM | Admit: 2019-09-30 | Discharge: 2019-09-30 | Disposition: A | Discharge status: Home / Self Care | Payer: 59 | Attending: Emergency Medicine | Admitting: Emergency Medicine

## 2019-09-30 DIAGNOSIS — E039 Hypothyroidism, unspecified: Secondary | ICD-10-CM | POA: Insufficient documentation

## 2019-09-30 DIAGNOSIS — G4762 Sleep related leg cramps: Secondary | ICD-10-CM | POA: Insufficient documentation

## 2019-09-30 DIAGNOSIS — R252 Cramp and spasm: Secondary | ICD-10-CM

## 2019-09-30 DIAGNOSIS — E119 Type 2 diabetes mellitus without complications: Secondary | ICD-10-CM | POA: Diagnosis not present

## 2019-09-30 DIAGNOSIS — Z7984 Long term (current) use of oral hypoglycemic drugs: Secondary | ICD-10-CM | POA: Diagnosis not present

## 2019-09-30 DIAGNOSIS — R109 Unspecified abdominal pain: Secondary | ICD-10-CM | POA: Diagnosis present

## 2019-09-30 DIAGNOSIS — I10 Essential (primary) hypertension: Secondary | ICD-10-CM | POA: Insufficient documentation

## 2019-09-30 LAB — CBC WITH DIFFERENTIAL/PLATELET
Abs Immature Granulocytes: 0.03 10*3/uL (ref 0.00–0.07)
Basophils Absolute: 0.1 10*3/uL (ref 0.0–0.1)
Basophils Relative: 0 %
Eosinophils Absolute: 0.5 10*3/uL (ref 0.0–0.5)
Eosinophils Relative: 4 %
HCT: 36.8 % (ref 36.0–46.0)
Hemoglobin: 11.4 g/dL — ABNORMAL LOW (ref 12.0–15.0)
Immature Granulocytes: 0 %
Lymphocytes Relative: 27 %
Lymphs Abs: 3.2 10*3/uL (ref 0.7–4.0)
MCH: 27 pg (ref 26.0–34.0)
MCHC: 31 g/dL (ref 30.0–36.0)
MCV: 87 fL (ref 80.0–100.0)
Monocytes Absolute: 0.9 10*3/uL (ref 0.1–1.0)
Monocytes Relative: 7 %
Neutro Abs: 7.2 10*3/uL (ref 1.7–7.7)
Neutrophils Relative %: 62 %
Platelets: 478 10*3/uL — ABNORMAL HIGH (ref 150–400)
RBC: 4.23 MIL/uL (ref 3.87–5.11)
RDW: 14.4 % (ref 11.5–15.5)
WBC: 11.8 10*3/uL — ABNORMAL HIGH (ref 4.0–10.5)
nRBC: 0 % (ref 0.0–0.2)

## 2019-09-30 LAB — COMPREHENSIVE METABOLIC PANEL
ALT: 10 U/L (ref 0–44)
AST: 18 U/L (ref 15–41)
Albumin: 4.4 g/dL (ref 3.5–5.0)
Alkaline Phosphatase: 53 U/L (ref 38–126)
Anion gap: 13 (ref 5–15)
BUN: 26 mg/dL — ABNORMAL HIGH (ref 8–23)
CO2: 24 mmol/L (ref 22–32)
Calcium: 9.8 mg/dL (ref 8.9–10.3)
Chloride: 101 mmol/L (ref 98–111)
Creatinine, Ser: 1.03 mg/dL — ABNORMAL HIGH (ref 0.44–1.00)
GFR calc Af Amer: >60 mL/min (ref >60)
GFR calc non Af Amer: 56 mL/min — ABNORMAL LOW (ref >60)
Glucose, Bld: 107 mg/dL — ABNORMAL HIGH (ref 70–99)
Potassium: 4.9 mmol/L (ref 3.5–5.1)
Sodium: 138 mmol/L (ref 135–145)
Total Bilirubin: 0.3 mg/dL (ref 0.3–1.2)
Total Protein: 9 g/dL — ABNORMAL HIGH (ref 6.5–8.1)

## 2019-09-30 LAB — URINALYSIS, ROUTINE W REFLEX MICROSCOPIC
Bilirubin Urine: NEGATIVE
Glucose, UA: NEGATIVE mg/dL
Hgb urine dipstick: NEGATIVE
Ketones, ur: NEGATIVE mg/dL
Leukocytes,Ua: NEGATIVE
Nitrite: NEGATIVE
Protein, ur: NEGATIVE mg/dL
Specific Gravity, Urine: 1.016 (ref 1.005–1.030)
pH: 5 (ref 5.0–8.0)

## 2019-09-30 LAB — TSH: TSH: 2.087 u[IU]/mL (ref 0.350–4.500)

## 2019-09-30 LAB — MAGNESIUM: Magnesium: 2.4 mg/dL (ref 1.7–2.4)

## 2019-09-30 MED ORDER — SODIUM CHLORIDE 0.9 % IV BOLUS
1000.0000 mL | Freq: Once | INTRAVENOUS | Status: AC
  Administered 2019-09-30: 1000 mL via INTRAVENOUS

## 2019-09-30 MED ORDER — DIPHENHYDRAMINE HCL 25 MG PO TABS
12.5000 mg | ORAL_TABLET | Freq: Every evening | ORAL | 0 refills | Status: AC | PRN
Start: 2019-09-30 — End: ?

## 2019-09-30 MED ORDER — PRENATAL VITAMIN AND MINERAL 28-0.8 MG PO TABS: 1.0000 | ORAL_TABLET | Freq: Every day | ORAL | 0 refills | Status: DC

## 2019-09-30 NOTE — Discharge Instructions (Signed)
Continue to hold Lipitor.  Hold Vitamin B, Vitamin D+Calcium, and Folic acid vitamins.  Take the prenatal vitamin daily instead. Take benadryl 12.5 mg at night.  You can increase this to 25 mg at night if needed. Nightly stretching/yoga for at least 15 minutes before bed.

## 2019-09-30 NOTE — ED Provider Notes (Signed)
[facility] Provider Note   CSN: [id number] Arrival date & time: 09/30/19  4888     History Chief Complaint  Patient presents with  . Abdominal Cramping    [name] [name] is a 69 y.o. female.  Pt presents to the ED today with cramps to hands, feet, legs, and abdomen for about 3 weeks.  She initially saw her pcp and was put on Neurontin.  She said that has not helped.  She stopped her Lipitor about a week ago and has not seen any changes.  She went to UC yesterday and had some blood work done.  Her CRP, ESR, and CK were all a little elevated.  She has had some urinary frequency, UA neg. She was told to take Azo pills.  Otherwise, she was not given any other medication.  Pt said the cramps are worse at night.  She has tried mustard without improvement.        Past Medical History:  Diagnosis Date  . Allergy   . Anemia   . Arthritis   . Asthma    as child only  . Back pain   . Diabetes mellitus without complication ([place])   . Hypertension   . Hypothyroidism     Patient Active Problem List   Diagnosis Date Noted  . Unilateral primary osteoarthritis, left hip 03/06/2019  . Status post total replacement of right hip 11/18/2018  . Unilateral primary osteoarthritis, right hip 09/29/2018  . Bilateral primary osteoarthritis of hip 05/25/2018  . Spinal stenosis of lumbar region with neurogenic claudication 04/20/2018  . Impingement syndrome of right shoulder 04/20/2018  . Essential hypertension, benign 02/20/2016  . Elevated blood pressure reading 09/29/2011  . Chronic back pain 09/29/2011  . Leg edema 09/29/2011  . Obesity 09/29/2011    Past Surgical History:  Procedure Laterality Date  . ABDOMINAL HYSTERECTOMY  1989   fibroid-no longer needs pap  . [place]   DDD  . CARPAL TUNNEL RELEASE Left 05/28/2016   Procedure: CARPAL TUNNEL RELEASE;  Surgeon: Daryll Brod, MD;  Location: [facility];  Service: Orthopedics;   Laterality: Left;  . CARPAL TUNNEL RELEASE Right 03/17/2018   Procedure: CARPAL TUNNEL RELEASE;  Surgeon: Daryll Brod, MD;  Location: [facility];  Service: Orthopedics;  Laterality: Right;  . TOTAL HIP ARTHROPLASTY Right 11/18/2018   Procedure: RIGHT TOTAL HIP ARTHROPLASTY ANTERIOR APPROACH;  Surgeon: Mcarthur Rossetti, MD;  Location: [facility];  Service: Orthopedics;  Laterality: Right;  . TUBAL LIGATION       OB History   No obstetric history on file.     Family History  Problem Relation Age of Onset  . Asthma Mother   . Cancer Mother        AML  . Leukemia Mother   . Heart disease Father   . Hypertension Father   . Kidney disease Father   . Breast cancer Maternal Aunt     Social History   Tobacco Use  . Smoking status: Never Smoker  . Smokeless tobacco: Never Used  Vaping Use  . Vaping Use: Never used  Substance Use Topics  . Alcohol use: Yes    Comment: social  . Drug use: No    Home Medications Prior to Admission medications   Medication Sig Start Date End Date Taking? Authorizing Provider  amLODipine (NORVASC) 5 MG tablet Take 1 tablet (5 mg total) by mouth daily. 03/04/16   Dettinger, Fransisca Kaufmann, MD  amoxicillin-clavulanate (AUGMENTIN) 875-125 MG tablet Take 1 tablet by mouth 2 (two) times daily. One po bid x 7 days 09/28/19   Noe Gens, PA-C  Calcium Carb-Cholecalciferol (CALCIUM 600 + D PO) Take 1 tablet by mouth daily.    [provider]  celecoxib (CELEBREX) 200 MG capsule TAKE 1 CAPSULE DAILY 09/28/19   Mcarthur Rossetti, MD  cephALEXin (KEFLEX) 500 MG capsule Take 500 mg by mouth 4 (four) times daily.    [provider]  cyclobenzaprine (FLEXERIL) 5 MG tablet Take 5 mg by mouth 3 (three) times daily as needed for muscle spasms.    [provider]  diphenhydrAMINE (BENADRYL) 25 MG tablet Take 0.5 tablets (12.5 mg total) by mouth at bedtime as needed (cramping). 09/30/19   Isla Pence, MD  levothyroxine  (SYNTHROID, LEVOTHROID) 25 MCG tablet Take 25 mcg by mouth daily before breakfast.  07/20/17   [provider]  Prenatal Vit-Fe Fumarate-FA (PRENATAL VITAMIN AND MINERAL) 28-0.8 MG TABS Take 1 tablet by mouth daily. 09/30/19   Isla Pence, MD  atorvastatin (LIPITOR) 20 MG tablet Take 20 mg by mouth daily.  07/15/17 09/30/19  [provider]    Allergies    Flagyl [metronidazole]  Review of Systems   Review of Systems  Musculoskeletal:       Muscle cramping  All other systems reviewed and are negative.   Physical Exam Updated Vital Signs BP 133/76   Pulse 77   Temp 98.5 F (36.9 C) (Oral)   Resp 18   Ht 5' 8.5" (1.74 m)   Wt 93.4 kg   SpO2 99%   BMI 30.87 kg/m   Physical Exam Vitals and nursing note reviewed.  Constitutional:      Appearance: Normal appearance.  HENT:     Head: Normocephalic and atraumatic.     Right Ear: External ear normal.     Left Ear: External ear normal.     Nose: Nose normal.     Mouth/Throat:     Mouth: Mucous membranes are moist.     Pharynx: Oropharynx is clear.  Eyes:     Extraocular Movements: Extraocular movements intact.     Conjunctiva/sclera: Conjunctivae normal.     Pupils: Pupils are equal, round, and reactive to light.  Cardiovascular:     Rate and Rhythm: Normal rate and regular rhythm.     Pulses: Normal pulses.     Heart sounds: Normal heart sounds.  Pulmonary:     Effort: Pulmonary effort is normal.     Breath sounds: Normal breath sounds.  Abdominal:     General: Abdomen is flat. Bowel sounds are normal.     Palpations: Abdomen is soft.  Musculoskeletal:        General: Normal range of motion.     Cervical back: Normal range of motion and neck supple.  Skin:    General: Skin is warm.     Capillary Refill: Capillary refill takes less than 2 seconds.  Neurological:     General: No focal deficit present.     Mental Status: She is alert and oriented to person, place, and time.  Psychiatric:         Mood and Affect: Mood normal.        Behavior: Behavior normal.        Thought Content: Thought content normal.        Judgment: Judgment normal.     ED Results / Procedures / Treatments   Labs (all labs  ordered are listed, but only abnormal results are displayed) Labs Reviewed  COMPREHENSIVE METABOLIC PANEL - Abnormal; Notable for the following components:      Result Value   Glucose, Bld 107 (*)    BUN 26 (*)    Creatinine, Ser 1.03 (*)    Total Protein 9.0 (*)    GFR calc non Af Amer 56 (*)    All other components within normal limits  CBC WITH DIFFERENTIAL/PLATELET - Abnormal; Notable for the following components:   WBC 11.8 (*)    Hemoglobin 11.4 (*)    Platelets 478 (*)    All other components within normal limits  URINALYSIS, ROUTINE W REFLEX MICROSCOPIC  TSH  MAGNESIUM    EKG None  Radiology No results found.  Procedures Procedures (including critical care time)  Medications Ordered in ED Medications  sodium chloride 0.9 % bolus 1,000 mL (0 mLs Intravenous Stopped 09/30/19 1200)    ED Course  I have reviewed the triage vital signs and the nursing notes.  Pertinent labs & imaging results that were available during my care of the patient were reviewed by me and considered in my medical decision making (see chart for details).    MDM Rules/Calculators/A&P                          Labs from UC reviewed.  I repeated labs which show nl calcium and other electrolytes.  TSH ok.  I will put pt on a prenatal vitamin to increase folic acid, iron, and vitamins.  She is encouraged to do yoga or stretching before bed.  Benadryl prn at night. Continue to hold lipitor.  Return if worse.  F/u with pcp.   Final Clinical Impression(s) / ED Diagnoses Final diagnoses:  Nocturnal muscle cramps    Rx / DC Orders ED Discharge Orders         Ordered    Prenatal Vit-Fe Fumarate-FA (PRENATAL VITAMIN AND MINERAL) 28-0.8 MG TABS  Daily     Discontinue  Reprint      09/30/19 1153    diphenhydrAMINE (BENADRYL) 25 MG tablet  At bedtime PRN     Discontinue  Reprint     09/30/19 1153           Isla Pence, MD 09/30/19 1354

## 2019-09-30 NOTE — ED Triage Notes (Signed)
Patient c/o cramping in hands, feet, legs, and abd. Patients states cramping x3 weeks, progressively getting worse. Patient taking potassium pills with no improvement. Denies any nausea, vomiting, diarrhea, or fevers. Per patient urinary frequency. Patient is borderline diabetic. Denies increased thirst. Per patient seen at [facility] yesterday and had blood work -denies diagnosis or any proscriptions given. Patient told top get AZO pills for the urination symptoms.

## 2019-10-02 ENCOUNTER — Encounter: Payer: 59 | Admitting: Physical Therapy

## 2019-10-05 ENCOUNTER — Encounter: Payer: 59 | Admitting: Physical Therapy

## 2019-10-28 ENCOUNTER — Other Ambulatory Visit: Payer: Self-pay | Admitting: Physician Assistant

## 2019-11-18 ENCOUNTER — Other Ambulatory Visit: Payer: Self-pay | Admitting: Physician Assistant

## 2019-12-01 DIAGNOSIS — E1142 Type 2 diabetes mellitus with diabetic polyneuropathy: Secondary | ICD-10-CM | POA: Insufficient documentation

## 2019-12-01 DIAGNOSIS — M609 Myositis, unspecified: Secondary | ICD-10-CM | POA: Insufficient documentation

## 2019-12-01 DIAGNOSIS — J4520 Mild intermittent asthma, uncomplicated: Secondary | ICD-10-CM | POA: Insufficient documentation

## 2019-12-21 ENCOUNTER — Other Ambulatory Visit: Payer: Self-pay | Admitting: Physician Assistant

## 2019-12-22 NOTE — Telephone Encounter (Signed)
Pls advise.  

## 2019-12-25 ENCOUNTER — Ambulatory Visit (INDEPENDENT_AMBULATORY_CARE_PROVIDER_SITE_OTHER): Payer: 59 | Admitting: Orthopaedic Surgery

## 2019-12-25 ENCOUNTER — Encounter: Payer: Self-pay | Admitting: Orthopaedic Surgery

## 2019-12-25 ENCOUNTER — Ambulatory Visit: Payer: Self-pay

## 2019-12-25 DIAGNOSIS — M545 Low back pain: Secondary | ICD-10-CM | POA: Diagnosis not present

## 2019-12-25 DIAGNOSIS — G8929 Other chronic pain: Secondary | ICD-10-CM

## 2019-12-25 NOTE — Progress Notes (Signed)
The patient comes in today with chief complaint of low back pain.  She does get pain going down both her anterior shins and the back of her legs as well as numbness and tingling on the right third and fourth toes.  I have replaced her right hip last year and that is done well.  She does have known severe arthritis on plain films of her left hip but she denies any groin pain or left hip pain.  She had MRI of her lumbar spine in 2019 showing severe stenosis at L3-L4 and moderate stenosis at L4-L5.  She also has a history of remote spine surgery years before that with no instrumentation her back.  On examination I can put her right operative hip through full internal and external rotation without any pain.  Her left hip actually moves smoothly as well in light of its arthritic changes on plain films.  Both feet have subjective numbness more so on the right foot than the left but there is no significant deficits in strength.  She is not walking with any assisted device.  2 views of lumbar spine are obtained and show severe degenerative changes at multiple levels.  She does report that she had interventions in her spine with injections years ago by Dr. Alvester Morin.  I cannot find that information but certainly she would like to see him again.  I feel that based on her MRI findings that likely she needs injections at the L3-L4 level which would be ESI's as opposed to facet injections based on what she is feeling with radicular symptoms.  We will work on getting her an appointment for injections with Dr. Alvester Morin.  He can then get her back to me several weeks after the injections.  All question concerns were answered and addressed.  She agrees with this treatment plan.

## 2019-12-26 ENCOUNTER — Other Ambulatory Visit: Payer: Self-pay

## 2019-12-26 DIAGNOSIS — G8929 Other chronic pain: Secondary | ICD-10-CM

## 2019-12-26 DIAGNOSIS — M545 Low back pain, unspecified: Secondary | ICD-10-CM

## 2020-01-16 ENCOUNTER — Encounter: Payer: Self-pay | Admitting: Physical Medicine and Rehabilitation

## 2020-01-16 ENCOUNTER — Ambulatory Visit (INDEPENDENT_AMBULATORY_CARE_PROVIDER_SITE_OTHER): Payer: 59 | Admitting: Physical Medicine and Rehabilitation

## 2020-01-16 ENCOUNTER — Other Ambulatory Visit: Payer: Self-pay

## 2020-01-16 ENCOUNTER — Ambulatory Visit: Payer: Self-pay

## 2020-01-16 VITALS — BP 126/84 | HR 83

## 2020-01-16 DIAGNOSIS — M5416 Radiculopathy, lumbar region: Secondary | ICD-10-CM | POA: Diagnosis not present

## 2020-01-16 DIAGNOSIS — M48062 Spinal stenosis, lumbar region with neurogenic claudication: Secondary | ICD-10-CM

## 2020-01-16 MED ORDER — METHYLPREDNISOLONE ACETATE 80 MG/ML IJ SUSP
80.0000 mg | Freq: Once | INTRAMUSCULAR | Status: AC
  Administered 2020-01-16: 80 mg

## 2020-01-16 NOTE — Progress Notes (Signed)
Pt states lower back pain that travels down her left leg and she also has numbness in her right first two toes. Pt state house work or climbing stairs makes the pain worse. Pt state heating pad and pain meds helps ease the pain.  Numeric Pain Rating Scale and Functional Assessment Average Pain 6   In the last MONTH (on 0-10 scale) has pain interfered with the following?  1. General activity like being  able to carry out your everyday physical activities such as walking, climbing stairs, carrying groceries, or moving a chair?  Rating(10)   +Driver, -BT, -Dye Allergies.

## 2020-01-22 ENCOUNTER — Other Ambulatory Visit: Payer: Self-pay | Admitting: Orthopaedic Surgery

## 2020-01-22 NOTE — Telephone Encounter (Signed)
Please advise 

## 2020-01-29 ENCOUNTER — Telehealth: Payer: Self-pay | Admitting: Physical Medicine and Rehabilitation

## 2020-01-29 NOTE — Procedures (Signed)
Lumbosacral Transforaminal Epidural Steroid Injection - Sub-Pedicular Approach with Fluoroscopic Guidance  Patient: [name] [name]      Date of Birth: 1951-01-31 MRN: [id number] PCP: Harvest Forest, MD      Visit Date: 01/16/2020   Universal Protocol:    Date/Time: 01/16/2020  Consent Given By: the patient  Position: PRONE  Additional Comments: Vital signs were monitored before and after the procedure. Patient was prepped and draped in the usual sterile fashion. The correct patient, procedure, and site was verified.   Injection Procedure Details:   Procedure diagnoses:  1. Lumbar radiculopathy   2. Spinal stenosis of lumbar region with neurogenic claudication      Meds Administered:  Meds ordered this encounter  Medications  . methylPREDNISolone acetate (DEPO-MEDROL) injection 80 mg    Laterality: Bilateral  Location/Site:  L3-L4  Needle size: 22 G  Needle type: Spinal  Needle Placement: Transforaminal  Findings:    -Comments: Excellent flow of contrast along the nerve, nerve root and into the epidural space.  Procedure Details: After squaring off the end-plates to get a true AP view, the C-arm was positioned so that an oblique view of the foramen as noted above was visualized. The target area is just inferior to the "nose of the scotty dog" or sub pedicular. The soft tissues overlying this structure were infiltrated with 2-3 ml. of 1% Lidocaine without Epinephrine.  The spinal needle was inserted toward the target using a "trajectory" view along the fluoroscope beam.  Under AP and lateral visualization, the needle was advanced so it did not puncture dura and was located close the 6 O'Clock position of the pedical in AP tracterory. Biplanar projections were used to confirm position. Aspiration was confirmed to be negative for CSF and/or blood. A 1-2 ml. volume of Isovue-250 was injected and flow of contrast was noted at each level. Radiographs were obtained for  documentation purposes.   After attaining the desired flow of contrast documented above, a 0.5 to 1.0 ml test dose of 0.25% Marcaine was injected into each respective transforaminal space.  The patient was observed for 90 seconds post injection.  After no sensory deficits were reported, and normal lower extremity motor function was noted,   the above injectate was administered so that equal amounts of the injectate were placed at each foramen (level) into the transforaminal epidural space.   Additional Comments:  The patient tolerated the procedure well Dressing: 2 x 2 sterile gauze and Band-Aid    Post-procedure details: Patient was observed during the procedure. Post-procedure instructions were reviewed.  Patient left the clinic in stable condition.

## 2020-01-29 NOTE — Telephone Encounter (Signed)
Pt called stating she would like to schedule a F/U appt from when she got her injections  [phone]

## 2020-01-29 NOTE — Progress Notes (Signed)
[name] [name] - 69 y.o. female MRN [id number]  Date of birth: 04-06-1951  Office Visit Note: Visit Date: 01/16/2020 PCP: Harvest Forest, MD Referred by: Harvest Forest, MD  Subjective: Chief Complaint  Patient presents with  . Lower Back - Pain  . Left Leg - Pain  . Right Leg - Pain, Numbness  . Right Foot - Numbness   HPI:  [name] [name] is a 69 y.o. female who comes in today at the request of Dr. Doneen Poisson for planned Bilateral L3-L4 Lumbar epidural steroid injection with fluoroscopic guidance.  The patient has failed conservative care including home exercise, medications, time and activity modification.  This injection will be diagnostic and hopefully therapeutic.  Please see requesting physician notes for further details and justification.  MRI reviewed with images and spine model.  MRI reviewed in the note below.   ROS Otherwise per HPI.  Assessment & Plan: Visit Diagnoses:  1. Lumbar radiculopathy   2. Spinal stenosis of lumbar region with neurogenic claudication     Plan: No additional findings.   Meds & Orders:  Meds ordered this encounter  Medications  . methylPREDNISolone acetate (DEPO-MEDROL) injection 80 mg    Orders Placed This Encounter  Procedures  . XR C-ARM NO REPORT  . Epidural Steroid injection    Follow-up: Return if symptoms worsen or fail to improve.   Procedures: No procedures performed  Lumbosacral Transforaminal Epidural Steroid Injection - Sub-Pedicular Approach with Fluoroscopic Guidance  Patient: [name] [name]      Date of Birth: 09/17/1950 MRN: [id number] PCP: Harvest Forest, MD      Visit Date: 01/16/2020   Universal Protocol:    Date/Time: 01/16/2020  Consent Given By: the patient  Position: PRONE  Additional Comments: Vital signs were monitored before and after the procedure. Patient was prepped and draped in the usual sterile fashion. The correct patient, procedure, and site was  verified.   Injection Procedure Details:   Procedure diagnoses:  1. Lumbar radiculopathy   2. Spinal stenosis of lumbar region with neurogenic claudication      Meds Administered:  Meds ordered this encounter  Medications  . methylPREDNISolone acetate (DEPO-MEDROL) injection 80 mg    Laterality: Bilateral  Location/Site:  L3-L4  Needle size: 22 G  Needle type: Spinal  Needle Placement: Transforaminal  Findings:    -Comments: Excellent flow of contrast along the nerve, nerve root and into the epidural space.  Procedure Details: After squaring off the end-plates to get a true AP view, the C-arm was positioned so that an oblique view of the foramen as noted above was visualized. The target area is just inferior to the "nose of the scotty dog" or sub pedicular. The soft tissues overlying this structure were infiltrated with 2-3 ml. of 1% Lidocaine without Epinephrine.  The spinal needle was inserted toward the target using a "trajectory" view along the fluoroscope beam.  Under AP and lateral visualization, the needle was advanced so it did not puncture dura and was located close the 6 O'Clock position of the pedical in AP tracterory. Biplanar projections were used to confirm position. Aspiration was confirmed to be negative for CSF and/or blood. A 1-2 ml. volume of Isovue-250 was injected and flow of contrast was noted at each level. Radiographs were obtained for documentation purposes.   After attaining the desired flow of contrast documented above, a 0.5 to 1.0 ml test dose of 0.25% Marcaine was injected into each respective transforaminal space.  The patient was observed for 90 seconds post injection.  After no sensory deficits were reported, and normal lower extremity motor function was noted,   the above injectate was administered so that equal amounts of the injectate were placed at each foramen (level) into the transforaminal epidural space.   Additional Comments:  The  patient tolerated the procedure well Dressing: 2 x 2 sterile gauze and Band-Aid    Post-procedure details: Patient was observed during the procedure. Post-procedure instructions were reviewed.  Patient left the clinic in stable condition.      Clinical History: MRI LUMBAR SPINE WITHOUT AND WITH CONTRAST  TECHNIQUE: Multiplanar and multiecho pulse sequences of the lumbar spine were obtained without and with intravenous contrast.  CONTRAST:  5mL MULTIHANCE GADOBENATE DIMEGLUMINE 529 MG/ML IV SOLN  COMPARISON:  Lumbar MRI 08/10/2009  FINDINGS: Segmentation:  Normal  Alignment: Mild retrolisthesis L1-2 and L2-3. 5 mm anterolisthesis L4-5 has progressed.  Vertebrae: Negative for fracture or mass. Fatty endplate changes at L5-S1 due to disc degeneration similar to the prior study.  Conus medullaris and cauda equina: Conus extends to the L1-2 level. Conus and cauda equina appear normal.  Paraspinal and other soft tissues: No retroperitoneal mass or adenopathy  Disc levels:  T12-L1: Small right paracentral disc protrusion without significant stenosis. Mild facet degeneration.  L1-2: Mild retrolisthesis. Disc bulging and facet degeneration without significant stenosis  L2-3: Progressive disc degeneration and retrolisthesis. Progressive facet degeneration. Moderate foraminal encroachment bilaterally left greater than right has progressed with potential for impingement of the L2 nerve roots especially on the left. Mild spinal stenosis has progressed.  L3-4: Diffuse disc bulging and facet hypertrophy has progressed. Severe spinal stenosis has progressed. Moderate right foraminal encroachment has progressed  L4-5: Progressive anterolisthesis. Severe facet degeneration has progressed. Moderate spinal stenosis similar to the prior study  L5-S1: Remote laminectomy on the right with epidural scarring around the right S1 nerve root and thecal sac unchanged.  No recurrent disc protrusion or stenosis.  IMPRESSION: Mild spinal stenosis L2-3 has progressed. Progressive disc and facet degeneration. Moderate foraminal encroachment left greater than right has progressed in the interval  Severe spinal stenosis L3-4 has progressed. Moderate right foraminal encroachment also has progressed  Progressive anterolisthesis at L4-5 with moderate spinal stenosis similar to the prior study  Remote laminectomy right L5-S1 with epidural scar on the right. No recurrent disc protrusion or stenosis.   Electronically Signed   By: Marlan Palau M.D.   On: 08/21/2017 13:06     Objective:  VS:  HT:    WT:   BMI:     BP:126/84  HR:83bpm  TEMP: ( )  RESP:  Physical Exam Constitutional:      General: She is not in acute distress.    Appearance: Normal appearance. She is not ill-appearing.  HENT:     Head: Normocephalic and atraumatic.     Right Ear: External ear normal.     Left Ear: External ear normal.  Eyes:     Extraocular Movements: Extraocular movements intact.  Cardiovascular:     Rate and Rhythm: Normal rate.     Pulses: Normal pulses.  Musculoskeletal:     Right lower leg: No edema.     Left lower leg: No edema.     Comments: Patient has good distal strength with no pain over the greater trochanters.  No clonus or focal weakness.  Skin:    Findings: No erythema, lesion or rash.  Neurological:     General: No  focal deficit present.     Mental Status: She is alert and oriented to person, place, and time.     Sensory: No sensory deficit.     Motor: No weakness or abnormal muscle tone.     Coordination: Coordination normal.  Psychiatric:        Mood and Affect: Mood normal.        Behavior: Behavior normal.      Imaging: No results found.

## 2020-01-30 NOTE — Telephone Encounter (Signed)
Patient had bilateral L3 TF on 10/12. She reports that she has had very good relief on the left and some relief on the right. She states that the right side does still hurt. Please advise.

## 2020-01-30 NOTE — Telephone Encounter (Signed)
Needs auth for [id number]. Scheduled for 11/8 with driver.

## 2020-01-30 NOTE — Telephone Encounter (Signed)
Pt not req Auth#. 

## 2020-01-30 NOTE — Telephone Encounter (Signed)
Do right Lumbar L3 or L4 or s1 TF esi depending on symptoms when I see her

## 2020-02-12 ENCOUNTER — Other Ambulatory Visit: Payer: Self-pay

## 2020-02-12 ENCOUNTER — Ambulatory Visit: Payer: Self-pay

## 2020-02-12 ENCOUNTER — Ambulatory Visit (INDEPENDENT_AMBULATORY_CARE_PROVIDER_SITE_OTHER): Payer: 59 | Admitting: Physical Medicine and Rehabilitation

## 2020-02-12 ENCOUNTER — Encounter: Payer: Self-pay | Admitting: Physical Medicine and Rehabilitation

## 2020-02-12 VITALS — BP 126/80 | HR 93

## 2020-02-12 DIAGNOSIS — M48062 Spinal stenosis, lumbar region with neurogenic claudication: Secondary | ICD-10-CM

## 2020-02-12 MED ORDER — METHYLPREDNISOLONE ACETATE 80 MG/ML IJ SUSP
80.0000 mg | Freq: Once | INTRAMUSCULAR | Status: AC
  Administered 2020-02-12: 80 mg

## 2020-02-12 NOTE — Progress Notes (Signed)
Pt state right lower back pain. Pt state when she standing at the kitchen sink makes her pain worse. Pt state she sit down to help ease the pain. Pt has hx of inj on 01/16/20 pt state it helped the left side.  Numeric Pain Rating Scale and Functional Assessment Average Pain 7   In the last MONTH (on 0-10 scale) has pain interfered with the following?  1. General activity like being  able to carry out your everyday physical activities such as walking, climbing stairs, carrying groceries, or moving a chair?  Rating(10)   +Driver, -BT, -Dye Allergies.

## 2020-02-12 NOTE — Procedures (Signed)
Lumbosacral Transforaminal Epidural Steroid Injection - Sub-Pedicular Approach with Fluoroscopic Guidance  Patient: [name] [name]      Date of Birth: [birth year] MRN: [id number] PCP: Harvest Forest, MD      Visit Date: 02/12/2020   Universal Protocol:    Date/Time: 02/12/2020  Consent Given By: the patient  Position: PRONE  Additional Comments: Vital signs were monitored before and after the procedure. Patient was prepped and draped in the usual sterile fashion. The correct patient, procedure, and site was verified.   Injection Procedure Details:   Procedure diagnoses:  1. Spinal stenosis of lumbar region with neurogenic claudication      Meds Administered:  Meds ordered this encounter  Medications  . methylPREDNISolone acetate (DEPO-MEDROL) injection 80 mg    Laterality: Right  Location/Site:  L4-L5  Needle:5.0 in., 22 ga.  Short bevel or Quincke spinal needle  Needle Placement: Transforaminal  Findings:    -Comments: Excellent flow of contrast along the nerve, nerve root and into the epidural space.  Procedure Details: After squaring off the end-plates to get a true AP view, the C-arm was positioned so that an oblique view of the foramen as noted above was visualized. The target area is just inferior to the "nose of the scotty dog" or sub pedicular. The soft tissues overlying this structure were infiltrated with 2-3 ml. of 1% Lidocaine without Epinephrine.  The spinal needle was inserted toward the target using a "trajectory" view along the fluoroscope beam.  Under AP and lateral visualization, the needle was advanced so it did not puncture dura and was located close the 6 O'Clock position of the pedical in AP tracterory. Biplanar projections were used to confirm position. Aspiration was confirmed to be negative for CSF and/or blood. A 1-2 ml. volume of Isovue-250 was injected and flow of contrast was noted at each level. Radiographs were obtained for  documentation purposes.   After attaining the desired flow of contrast documented above, a 0.5 to 1.0 ml test dose of 0.25% Marcaine was injected into each respective transforaminal space.  The patient was observed for 90 seconds post injection.  After no sensory deficits were reported, and normal lower extremity motor function was noted,   the above injectate was administered so that equal amounts of the injectate were placed at each foramen (level) into the transforaminal epidural space.   Additional Comments:  The patient tolerated the procedure well Dressing: 2 x 2 sterile gauze and Band-Aid    Post-procedure details: Patient was observed during the procedure. Post-procedure instructions were reviewed.  Patient left the clinic in stable condition.

## 2020-02-12 NOTE — Progress Notes (Signed)
[name] [name] - 69 y.o. female MRN [id number]  Date of birth: 1951/03/23  Office Visit Note: Visit Date: 02/12/2020 PCP: Harvest Forest, MD Referred by: Harvest Forest, MD  Subjective: Chief Complaint  Patient presents with  . Lower Back - Pain   HPI:  [name] [name] is a 69 y.o. female who comes in today at the request of Dr. Doneen Poisson for planned Right L4-L5 Lumbar epidural steroid injection with fluoroscopic guidance.  The patient has failed conservative care including home exercise, medications, time and activity modification.  This injection will be diagnostic and hopefully therapeutic.  Please see requesting physician notes for further details and justification.   Prior bilateral L3 transforaminal epidural steroid injection gave her really good relief on the left but not the right.  She had temporary relief on the right.  She has severe multifactorial stenosis at this level with moderate multifactorial stenosis at L4-5.  We will complete a right L4 transforaminal injection diagnostically.  Depending on relief could look at facet joint block versus surgical referral.  She can return to see Dr. Magnus Ivan from orthopedic standpoint as well.  ROS Otherwise per HPI.  Assessment & Plan: Visit Diagnoses:  1. Spinal stenosis of lumbar region with neurogenic claudication     Plan: No additional findings.   Meds & Orders:  Meds ordered this encounter  Medications  . methylPREDNISolone acetate (DEPO-MEDROL) injection 80 mg    Orders Placed This Encounter  Procedures  . XR C-ARM NO REPORT  . Epidural Steroid injection    Follow-up: Return if symptoms worsen or fail to improve.   Procedures: No procedures performed  Lumbosacral Transforaminal Epidural Steroid Injection - Sub-Pedicular Approach with Fluoroscopic Guidance  Patient: [name] [name]      Date of Birth: Mar 23, 1951 MRN: [id number] PCP: Harvest Forest, MD      Visit Date: 02/12/2020   Universal  Protocol:    Date/Time: 02/12/2020  Consent Given By: the patient  Position: PRONE  Additional Comments: Vital signs were monitored before and after the procedure. Patient was prepped and draped in the usual sterile fashion. The correct patient, procedure, and site was verified.   Injection Procedure Details:   Procedure diagnoses:  1. Spinal stenosis of lumbar region with neurogenic claudication      Meds Administered:  Meds ordered this encounter  Medications  . methylPREDNISolone acetate (DEPO-MEDROL) injection 80 mg    Laterality: Right  Location/Site:  L4-L5  Needle:5.0 in., 22 ga.  Short bevel or Quincke spinal needle  Needle Placement: Transforaminal  Findings:    -Comments: Excellent flow of contrast along the nerve, nerve root and into the epidural space.  Procedure Details: After squaring off the end-plates to get a true AP view, the C-arm was positioned so that an oblique view of the foramen as noted above was visualized. The target area is just inferior to the "nose of the scotty dog" or sub pedicular. The soft tissues overlying this structure were infiltrated with 2-3 ml. of 1% Lidocaine without Epinephrine.  The spinal needle was inserted toward the target using a "trajectory" view along the fluoroscope beam.  Under AP and lateral visualization, the needle was advanced so it did not puncture dura and was located close the 6 O'Clock position of the pedical in AP tracterory. Biplanar projections were used to confirm position. Aspiration was confirmed to be negative for CSF and/or blood. A 1-2 ml. volume of Isovue-250 was injected and flow of contrast was noted  at each level. Radiographs were obtained for documentation purposes.   After attaining the desired flow of contrast documented above, a 0.5 to 1.0 ml test dose of 0.25% Marcaine was injected into each respective transforaminal space.  The patient was observed for 90 seconds post injection.  After no  sensory deficits were reported, and normal lower extremity motor function was noted,   the above injectate was administered so that equal amounts of the injectate were placed at each foramen (level) into the transforaminal epidural space.   Additional Comments:  The patient tolerated the procedure well Dressing: 2 x 2 sterile gauze and Band-Aid    Post-procedure details: Patient was observed during the procedure. Post-procedure instructions were reviewed.  Patient left the clinic in stable condition.       Clinical History: MRI LUMBAR SPINE WITHOUT AND WITH CONTRAST  TECHNIQUE: Multiplanar and multiecho pulse sequences of the lumbar spine were obtained without and with intravenous contrast.  CONTRAST:  34mL MULTIHANCE GADOBENATE DIMEGLUMINE 529 MG/ML IV SOLN  COMPARISON:  Lumbar MRI 08/10/2009  FINDINGS: Segmentation:  Normal  Alignment: Mild retrolisthesis L1-2 and L2-3. 5 mm anterolisthesis L4-5 has progressed.  Vertebrae: Negative for fracture or mass. Fatty endplate changes at L5-S1 due to disc degeneration similar to the prior study.  Conus medullaris and cauda equina: Conus extends to the L1-2 level. Conus and cauda equina appear normal.  Paraspinal and other soft tissues: No retroperitoneal mass or adenopathy  Disc levels:  T12-L1: Small right paracentral disc protrusion without significant stenosis. Mild facet degeneration.  L1-2: Mild retrolisthesis. Disc bulging and facet degeneration without significant stenosis  L2-3: Progressive disc degeneration and retrolisthesis. Progressive facet degeneration. Moderate foraminal encroachment bilaterally left greater than right has progressed with potential for impingement of the L2 nerve roots especially on the left. Mild spinal stenosis has progressed.  L3-4: Diffuse disc bulging and facet hypertrophy has progressed. Severe spinal stenosis has progressed. Moderate right foraminal encroachment  has progressed  L4-5: Progressive anterolisthesis. Severe facet degeneration has progressed. Moderate spinal stenosis similar to the prior study  L5-S1: Remote laminectomy on the right with epidural scarring around the right S1 nerve root and thecal sac unchanged. No recurrent disc protrusion or stenosis.  IMPRESSION: Mild spinal stenosis L2-3 has progressed. Progressive disc and facet degeneration. Moderate foraminal encroachment left greater than right has progressed in the interval  Severe spinal stenosis L3-4 has progressed. Moderate right foraminal encroachment also has progressed  Progressive anterolisthesis at L4-5 with moderate spinal stenosis similar to the prior study  Remote laminectomy right L5-S1 with epidural scar on the right. No recurrent disc protrusion or stenosis.   Electronically Signed   By: Marlan Palau M.D.   On: 08/21/2017 13:06     Objective:  VS:  HT:    WT:   BMI:     BP:126/80  HR:93bpm  TEMP: ( )  RESP:  Physical Exam Constitutional:      General: She is not in acute distress.    Appearance: Normal appearance. She is not ill-appearing.  HENT:     Head: Normocephalic and atraumatic.     Right Ear: External ear normal.     Left Ear: External ear normal.  Eyes:     Extraocular Movements: Extraocular movements intact.  Cardiovascular:     Rate and Rhythm: Normal rate.     Pulses: Normal pulses.  Musculoskeletal:     Right lower leg: No edema.     Left lower leg: No edema.  Comments: Patient has good distal strength with no pain over the greater trochanters.  No clonus or focal weakness.  Skin:    Findings: No erythema, lesion or rash.  Neurological:     General: No focal deficit present.     Mental Status: She is alert and oriented to person, place, and time.     Sensory: No sensory deficit.     Motor: No weakness or abnormal muscle tone.     Coordination: Coordination normal.  Psychiatric:        Mood and Affect:  Mood normal.        Behavior: Behavior normal.      Imaging: XR C-ARM NO REPORT  Result Date: 02/12/2020 Please see Notes tab for imaging impression.

## 2020-02-22 ENCOUNTER — Other Ambulatory Visit: Payer: Self-pay | Admitting: Orthopaedic Surgery

## 2020-02-22 NOTE — Telephone Encounter (Signed)
Please advise ok to refill?  

## 2020-03-15 ENCOUNTER — Other Ambulatory Visit: Payer: 59

## 2020-03-23 ENCOUNTER — Other Ambulatory Visit: Payer: Self-pay | Admitting: Orthopaedic Surgery

## 2020-03-25 NOTE — Telephone Encounter (Signed)
Ok to refill 

## 2020-04-18 ENCOUNTER — Other Ambulatory Visit: Payer: Self-pay | Admitting: Orthopaedic Surgery

## 2020-04-18 NOTE — Telephone Encounter (Signed)
Ok to refill 

## 2020-04-26 ENCOUNTER — Ambulatory Visit (HOSPITAL_COMMUNITY): Payer: 59 | Admitting: Hematology and Oncology

## 2020-05-15 ENCOUNTER — Other Ambulatory Visit: Payer: Self-pay | Admitting: Orthopaedic Surgery

## 2020-05-15 ENCOUNTER — Ambulatory Visit: Payer: Self-pay

## 2020-05-15 ENCOUNTER — Ambulatory Visit (INDEPENDENT_AMBULATORY_CARE_PROVIDER_SITE_OTHER): Payer: 59 | Admitting: Orthopaedic Surgery

## 2020-05-15 ENCOUNTER — Encounter: Payer: Self-pay | Admitting: Orthopaedic Surgery

## 2020-05-15 VITALS — Ht 68.5 in | Wt 206.0 lb

## 2020-05-15 DIAGNOSIS — M25552 Pain in left hip: Secondary | ICD-10-CM

## 2020-05-15 DIAGNOSIS — M1712 Unilateral primary osteoarthritis, left knee: Secondary | ICD-10-CM

## 2020-05-15 NOTE — Progress Notes (Signed)
Office Visit Note   Patient: [name] [name]           Date of Birth: Mar 08, 1951           MRN: [id number] Visit Date: 05/15/2020              Requested by: Harvest Forest, MD [address] [name],  [place] [address] PCP: Harvest Forest, MD   Assessment & Plan: Visit Diagnoses:  1. Pain in left hip   2. Unilateral primary osteoarthritis, left knee     Plan: I did review the patient's x-rays with her and talked her about the arthritis she does have in her left hip.  At some point she may consider hip replacement surgery.  However, she would like to at least try an injection in that hip and I believe this is reasonable.  We will see if she can be seen by Dr. Prince Rome for an ultrasound-guided intra-articular left hip injection.  I would then see her actually back as needed because she knows that if things worsen at all she can come see [place] to consider hip replacement surgery.  All questions and concerns were answered and addressed.  Follow-Up Instructions: Return if symptoms worsen or fail to improve.   Orders:  Orders Placed This Encounter  Procedures  . XR HIP UNILAT W OR W/O PELVIS 1V LEFT   No orders of the defined types were placed in this encounter.     Procedures: No procedures performed   Clinical Data: No additional findings.   Subjective: Chief Complaint  Patient presents with  . Left Hip - Pain  The patient is well-known to me.  She has a previous right total hip arthroplasty.  Her left hip previous films of shown arthritis in the left hip as well as an MRI confirming this.  Over the last 6 weeks she has been developing worsening left hip pain.  She is not ready for hip replacement surgery just yet she states.  She has had no other acute change in her medical status.  HPI  Review of Systems She currently denies any headache, chest pain, shortness of breath, fever, chills, nausea, vomiting  Objective: Vital Signs: Ht 5' 8.5" (1.74 m)   Wt  206 lb (93.4 kg)   BMI 30.87 kg/m   Physical Exam She is alert and orient x3 and in no acute distress Ortho Exam Examination of her right operative hip shows it moves smoothly and fluidly.  Examination of her left hip does show good range of motion but it does hurt in the groin with range of motion.  There is no blocks to motion. Specialty Comments:  No specialty comments available.  Imaging: XR HIP UNILAT W OR W/O PELVIS 1V LEFT  Result Date: 05/15/2020 An AP pelvis lateral left hip shows a total hip arthroplasty on the right but appears normal.  The left hip does have severe end-stage arthritis with complete loss of the joint space and flattening of the femoral head.    PMFS History: Patient Active Problem List   Diagnosis Date Noted  . Unilateral primary osteoarthritis, left knee 05/15/2020  . Unilateral primary osteoarthritis, left hip 03/06/2019  . Status post total replacement of right hip 11/18/2018  . Unilateral primary osteoarthritis, right hip 09/29/2018  . Bilateral primary osteoarthritis of hip 05/25/2018  . Spinal stenosis of lumbar region with neurogenic claudication 04/20/2018  . Impingement syndrome of right shoulder 04/20/2018  . Essential hypertension, benign 02/20/2016  .  Elevated blood pressure reading 09/29/2011  . Chronic back pain 09/29/2011  . Leg edema 09/29/2011  . Obesity 09/29/2011   Past Medical History:  Diagnosis Date  . Allergy   . Anemia   . Arthritis   . Asthma    as child only  . Back pain   . Diabetes mellitus without complication (HCC)   . Hypertension   . Hypothyroidism     Family History  Problem Relation Age of Onset  . Asthma Mother   . Cancer Mother        AML  . Leukemia Mother   . Heart disease Father   . Hypertension Father   . Kidney disease Father   . Breast cancer Maternal Aunt     Past Surgical History:  Procedure Laterality Date  . ABDOMINAL HYSTERECTOMY  1989   fibroid-no longer needs pap  . BACK SURGERY   1987, 1985   DDD  . CARPAL TUNNEL RELEASE Left 05/28/2016   Procedure: CARPAL TUNNEL RELEASE;  Surgeon: Cindee Salt, MD;  Location: [facility];  Service: Orthopedics;  Laterality: Left;  . CARPAL TUNNEL RELEASE Right 03/17/2018   Procedure: CARPAL TUNNEL RELEASE;  Surgeon: Cindee Salt, MD;  Location: [facility];  Service: Orthopedics;  Laterality: Right;  . TOTAL HIP ARTHROPLASTY Right 11/18/2018   Procedure: RIGHT TOTAL HIP ARTHROPLASTY ANTERIOR APPROACH;  Surgeon: Kathryne Hitch, MD;  Location: [facility];  Service: Orthopedics;  Laterality: Right;  . TUBAL LIGATION     Social History   Occupational History  . Not on file  Tobacco Use  . Smoking status: Never Smoker  . Smokeless tobacco: Never Used  Vaping Use  . Vaping Use: Never used  Substance and Sexual Activity  . Alcohol use: Yes    Comment: social  . Drug use: No  . Sexual activity: Not Currently

## 2020-05-15 NOTE — Progress Notes (Signed)
Subjective: Patient is here for ultrasound-guided intra-articular left hip injection.   Pain from DJD.  Objective:  Pain with passive IR.  Procedure: Ultrasound guided injection is preferred based studies that show increased duration, increased effect, greater accuracy, decreased procedural pain, increased response rate, and decreased cost with ultrasound guided versus blind injection.   Verbal informed consent obtained.  Time-out conducted.  Noted no overlying erythema, induration, or other signs of local infection. Ultrasound-guided left hip injection: After sterile prep with Betadine, injected 4 cc 0.25% bupivacaine without epinephrine and 6 mg betamethasone using a 22-gauge spinal needle, passing the needle through the iliofemoral ligament into the femoral head/neck junction.  Injectate seen filling joint capsule.    

## 2020-05-24 ENCOUNTER — Other Ambulatory Visit: Payer: Self-pay

## 2020-05-24 ENCOUNTER — Encounter (HOSPITAL_COMMUNITY): Payer: Self-pay | Admitting: Hematology and Oncology

## 2020-05-24 ENCOUNTER — Inpatient Hospital Stay (HOSPITAL_COMMUNITY): Payer: 59

## 2020-05-24 ENCOUNTER — Inpatient Hospital Stay (HOSPITAL_COMMUNITY): Payer: 59 | Attending: Hematology and Oncology | Admitting: Hematology and Oncology

## 2020-05-24 VITALS — BP 116/86 | HR 97 | Temp 99.9°F | Resp 17 | Wt 207.4 lb

## 2020-05-24 DIAGNOSIS — Z841 Family history of disorders of kidney and ureter: Secondary | ICD-10-CM | POA: Diagnosis not present

## 2020-05-24 DIAGNOSIS — D649 Anemia, unspecified: Secondary | ICD-10-CM

## 2020-05-24 DIAGNOSIS — Z888 Allergy status to other drugs, medicaments and biological substances status: Secondary | ICD-10-CM | POA: Insufficient documentation

## 2020-05-24 DIAGNOSIS — Z79899 Other long term (current) drug therapy: Secondary | ICD-10-CM

## 2020-05-24 DIAGNOSIS — I10 Essential (primary) hypertension: Secondary | ICD-10-CM | POA: Insufficient documentation

## 2020-05-24 DIAGNOSIS — Z806 Family history of leukemia: Secondary | ICD-10-CM | POA: Diagnosis not present

## 2020-05-24 DIAGNOSIS — M1990 Unspecified osteoarthritis, unspecified site: Secondary | ICD-10-CM

## 2020-05-24 DIAGNOSIS — Z836 Family history of other diseases of the respiratory system: Secondary | ICD-10-CM

## 2020-05-24 DIAGNOSIS — Z8249 Family history of ischemic heart disease and other diseases of the circulatory system: Secondary | ICD-10-CM

## 2020-05-24 DIAGNOSIS — Z808 Family history of malignant neoplasm of other organs or systems: Secondary | ICD-10-CM | POA: Insufficient documentation

## 2020-05-24 DIAGNOSIS — Z803 Family history of malignant neoplasm of breast: Secondary | ICD-10-CM | POA: Insufficient documentation

## 2020-05-24 DIAGNOSIS — Z881 Allergy status to other antibiotic agents status: Secondary | ICD-10-CM | POA: Insufficient documentation

## 2020-05-24 LAB — CBC WITH DIFFERENTIAL/PLATELET
Abs Immature Granulocytes: 0.04 10*3/uL (ref 0.00–0.07)
Basophils Absolute: 0 10*3/uL (ref 0.0–0.1)
Basophils Relative: 0 %
Eosinophils Absolute: 0.3 10*3/uL (ref 0.0–0.5)
Eosinophils Relative: 3 %
HCT: 35 % — ABNORMAL LOW (ref 36.0–46.0)
Hemoglobin: 10.6 g/dL — ABNORMAL LOW (ref 12.0–15.0)
Immature Granulocytes: 0 %
Lymphocytes Relative: 33 %
Lymphs Abs: 3.3 10*3/uL (ref 0.7–4.0)
MCH: 26.8 pg (ref 26.0–34.0)
MCHC: 30.3 g/dL (ref 30.0–36.0)
MCV: 88.4 fL (ref 80.0–100.0)
Monocytes Absolute: 0.8 10*3/uL (ref 0.1–1.0)
Monocytes Relative: 8 %
Neutro Abs: 5.5 10*3/uL (ref 1.7–7.7)
Neutrophils Relative %: 56 %
Platelets: 408 10*3/uL — ABNORMAL HIGH (ref 150–400)
RBC: 3.96 MIL/uL (ref 3.87–5.11)
RDW: 14.9 % (ref 11.5–15.5)
WBC: 10 10*3/uL (ref 4.0–10.5)
nRBC: 0 % (ref 0.0–0.2)

## 2020-05-24 LAB — CMP (CANCER CENTER ONLY)
ALT: 14 U/L (ref 0–44)
AST: 20 U/L (ref 15–41)
Albumin: 4.3 g/dL (ref 3.5–5.0)
Alkaline Phosphatase: 44 U/L (ref 38–126)
Anion gap: 9 (ref 5–15)
BUN: 12 mg/dL (ref 8–23)
CO2: 23 mmol/L (ref 22–32)
Calcium: 9.3 mg/dL (ref 8.9–10.3)
Chloride: 107 mmol/L (ref 98–111)
Creatinine: 0.75 mg/dL (ref 0.44–1.00)
GFR, Estimated: >60 mL/min (ref >60)
Glucose, Bld: 114 mg/dL — ABNORMAL HIGH (ref 70–99)
Potassium: 3.7 mmol/L (ref 3.5–5.1)
Sodium: 139 mmol/L (ref 135–145)
Total Bilirubin: 0.5 mg/dL (ref 0.3–1.2)
Total Protein: 7.8 g/dL (ref 6.5–8.1)

## 2020-05-24 LAB — RETICULOCYTES
Immature Retic Fract: 11.5 % (ref 2.3–15.9)
RBC.: 3.91 MIL/uL (ref 3.87–5.11)
Retic Count, Absolute: 64.9 10*3/uL (ref 19.0–186.0)
Retic Ct Pct: 1.7 % (ref 0.4–3.1)

## 2020-05-24 LAB — LACTATE DEHYDROGENASE: LDH: 150 U/L (ref 98–192)

## 2020-05-24 NOTE — Progress Notes (Signed)
[place] CONSULT NOTE  Patient Care Team: Audley Hose, MD as PCP - General (Internal Medicine)  CHIEF COMPLAINTS/PURPOSE OF CONSULTATION:  Anemia  ASSESSMENT & PLAN:  No problem-specific Assessment & Plan notes found for this encounter.  No orders of the defined types were placed in this encounter.  This is a very pleasant 70 yr old female patient with normocytic normochromic anemia  1. Normocytic normochromic anemia. Possible differential given the chronicity is anemia of chronic disease. Will rule out MM since this may present with normocytic normochromic anemia. I have also ordered labs to rule out hemolysis. If anemia persists, she may need repeat colonoscopy. She is agreeable to these recommendations. I dont believe there is an immediate need for BMB  2.Essential HTN, well controlled Continue current medication  3. Age appropriate cancer screening   HISTORY OF PRESENTING ILLNESS:   [name] [name] 70 y.o. female is here because of anemia.  Ms Branagan arrived to the appointment by herself. She reports no symptoms from this anemia. She doesn't remember being treated for anemia. She has not noted any blood in stool or melena. She denies any vaginal bleeding. She had hip replacement last yr. No dietary restrictions. She feels well, energy and appetite are normal. No new neurologic symptoms. No change in breathing, bowel habits and urinary habits. Rest of the pertinent 10 point ROS reviewed and negative.  REVIEW OF SYSTEMS:   Constitutional: Denies fevers, chills or abnormal night sweats Eyes: Denies blurriness of vision, double vision or watery eyes Ears, nose, mouth, throat, and face: Denies mucositis or sore throat Respiratory: Denies cough, dyspnea or wheezes Cardiovascular: Denies palpitation, chest discomfort or lower extremity swelling Gastrointestinal:  Denies nausea, heartburn or change in bowel habits Skin: Denies abnormal skin  rashes Lymphatics: Denies new lymphadenopathy or easy bruising Neurological:Denies numbness, tingling or new weaknesses Behavioral/Psych: Mood is stable, no new changes  All other systems were reviewed with the patient and are negative.  MEDICAL HISTORY:  Past Medical History:  Diagnosis Date  . Allergy   . Anemia   . Arthritis   . Asthma    as child only  . Back pain   . Diabetes mellitus without complication ([place])   . Hypertension   . Hypothyroidism     SURGICAL HISTORY: Past Surgical History:  Procedure Laterality Date  . ABDOMINAL HYSTERECTOMY  1989   fibroid-no longer needs pap  . [place]   DDD  . CARPAL TUNNEL RELEASE Left 05/28/2016   Procedure: CARPAL TUNNEL RELEASE;  Surgeon: Daryll Brod, MD;  Location: [facility];  Service: Orthopedics;  Laterality: Left;  . CARPAL TUNNEL RELEASE Right 03/17/2018   Procedure: CARPAL TUNNEL RELEASE;  Surgeon: Daryll Brod, MD;  Location: [facility];  Service: Orthopedics;  Laterality: Right;  . TOTAL HIP ARTHROPLASTY Right 11/18/2018   Procedure: RIGHT TOTAL HIP ARTHROPLASTY ANTERIOR APPROACH;  Surgeon: Mcarthur Rossetti, MD;  Location: [facility];  Service: Orthopedics;  Laterality: Right;  . TUBAL LIGATION      SOCIAL HISTORY: Social History   Socioeconomic History  . Marital status: Divorced    Spouse name: Not on file  . Number of children: Not on file  . Years of education: Not on file  . Highest education level: Not on file  Occupational History  . Not on file  Tobacco Use  . Smoking status: Never Smoker  . Smokeless tobacco: Never Used  Vaping Use  . Vaping Use:  Never used  Substance and Sexual Activity  . Alcohol use: Yes    Comment: social  . Drug use: No  . Sexual activity: Not Currently  Other Topics Concern  . Not on file  Social History Narrative   High school graduate, disabled from back pain. Divorced, has an adult son in [place].   Social  Determinants of Health   Financial Resource Strain: Not on file  Food Insecurity: Not on file  Transportation Needs: Not on file  Physical Activity: Inactive  . Days of Exercise per Week: 0 days  . Minutes of Exercise per Session: 0 min  Stress: Not on file  Social Connections: Not on file  Intimate Partner Violence: Not At Risk  . Fear of Current or Ex-Partner: No  . Emotionally Abused: No  . Physically Abused: No  . Sexually Abused: No    FAMILY HISTORY: Family History  Problem Relation Age of Onset  . Asthma Mother   . Cancer Mother        AML  . Leukemia Mother   . Heart disease Father   . Hypertension Father   . Kidney disease Father   . Breast cancer Maternal Aunt     ALLERGIES:  is allergic to gabapentin, celecoxib, metformin, and flagyl [metronidazole].  MEDICATIONS:  Current Outpatient Medications  Medication Sig Dispense Refill  . Acetaminophen-Codeine 300-30 MG tablet acetaminophen 300 mg-codeine 30 mg tablet    . albuterol (VENTOLIN HFA) 108 (90 Base) MCG/ACT inhaler INHALE 2 PUFFS EVERY 4THOURS AS NEEDED    . amLODipine (NORVASC) 10 MG tablet Take 10 mg by mouth daily.    . calcium carbonate (OSCAL) 1500 (600 Ca) MG TABS tablet Take by mouth.    . celecoxib (CELEBREX) 200 MG capsule TAKE 1 CAPSULE DAILY 30 capsule 0  . cyanocobalamin 1000 MCG tablet cyanocobalamin (vit B-12) 1,000 mcg tablet  Take 1 tablet every day by oral route for 90 days.    . cyclobenzaprine (FLEXERIL) 5 MG tablet Take 5 mg by mouth 3 (three) times daily as needed for muscle spasms.    . diphenhydrAMINE (BENADRYL) 25 MG tablet Take 0.5 tablets (12.5 mg total) by mouth at bedtime as needed (cramping). 30 tablet 0  . glimepiride (AMARYL) 1 MG tablet glimepiride 1 mg tablet  TAKE 1/2 TABLET EVERY DAY    . icosapent Ethyl (VASCEPA) 1 g capsule TAKE 2 CAPSULES BY MOUTH 2 TIMES DAILY.    . iron polysaccharides (NIFEREX) 150 MG capsule iFerex 150 150 mg iron capsule    . levothyroxine  (SYNTHROID) 75 MCG tablet levothyroxine 75 mcg tablet    . Multiple Vitamins-Minerals (CERTAVITE SENIOR/ANTIOXIDANT) TABS Take by mouth.    Marland Kitchen MYRBETRIQ 25 MG TB24 tablet Take 25 mg by mouth daily.    . nabumetone (RELAFEN) 500 MG tablet nabumetone 500 mg tablet    . pramipexole (MIRAPEX) 0.125 MG tablet     . pregabalin (LYRICA) 50 MG capsule pregabalin 50 mg capsule  TAKE 1 CAPSULE BY MOUTH AT BEDTIME.    Marland Kitchen rOPINIRole (REQUIP) 1 MG tablet ropinirole 1 mg tablet  Take 1 tablet 3 times a day by oral route for 30 days.    . Zoster Vaccine Adjuvanted [facility]) injection Shingrix (PF) 50 mcg/0.5 mL intramuscular suspension, kit     No current facility-administered medications for this visit.     PHYSICAL EXAMINATION: ECOG PERFORMANCE STATUS: 0 - Asymptomatic  Vitals:   05/24/20 1102  BP: 116/86  Pulse: 97  Resp: 17  Temp: 99.9 F (37.7 C)  SpO2: 99%   Filed Weights   05/24/20 1102  Weight: 207 lb 7 oz (94.1 kg)    GENERAL:alert, no distress and comfortable SKIN: skin color, texture, turgor are normal, no rashes or significant lesions EYES: normal, conjunctiva are pink and non-injected, sclera clear OROPHARYNX:no exudate, no erythema and lips, buccal mucosa, and tongue normal  NECK: supple, thyroid normal size, non-tender, without nodularity LYMPH:  no palpable lymphadenopathy in the cervical, axillary or inguinal LUNGS: clear to auscultation and percussion with normal breathing effort HEART: regular rate & rhythm and no murmurs and no lower extremity edema ABDOMEN:abdomen soft, non-tender and normal bowel sounds Musculoskeletal:no cyanosis of digits and no clubbing  PSYCH: alert & oriented x 3 with fluent speech NEURO: no focal motor/sensory deficits  LABORATORY DATA:  I have reviewed the data as listed Lab Results  Component Value Date   WBC 11.8 (H) 09/30/2019   HGB 11.4 (L) 09/30/2019   HCT 36.8 09/30/2019   MCV 87.0 09/30/2019   PLT 478 (H) 09/30/2019      Chemistry      Component Value Date/Time   NA 138 09/30/2019 1030   NA 138 03/11/2016 1114   K 4.9 09/30/2019 1030   CL 101 09/30/2019 1030   CO2 24 09/30/2019 1030   BUN 26 (H) 09/30/2019 1030   BUN 20 03/11/2016 1114   CREATININE 1.03 (H) 09/30/2019 1030   CREATININE 1.03 (H) 09/28/2019 1249      Component Value Date/Time   CALCIUM 9.8 09/30/2019 1030   ALKPHOS 53 09/30/2019 1030   AST 18 09/30/2019 1030   ALT 10 09/30/2019 1030   BILITOT 0.3 09/30/2019 1030   BILITOT 0.2 02/20/2016 0833       RADIOGRAPHIC STUDIES: I have personally reviewed the radiological images as listed and agreed with the findings in the report. US Guided Needle Placement - No Linked Charges  Result Date: 05/15/2020 Ultrasound guided injection is preferred based studies that show increased duration, increased effect, greater accuracy, decreased procedural pain, increased response rate, and decreased cost with ultrasound guided versus blind injection.   Verbal informed consent obtained.  Time-out conducted.  Noted no overlying erythema, induration, or other signs of local infection. Ultrasound-guided left hip injection: After sterile prep with Betadine, injected 4 cc 0.25% bupivacaine without epinephrine and 6 mg betamethasone using a 22-gauge spinal needle, passing the needle through the iliofemoral ligament into the femoral head/neck junction.  Injectate seen filling joint capsule.    XR HIP UNILAT W OR W/O PELVIS 1V LEFT  Result Date: 05/15/2020 An AP pelvis lateral left hip shows a total hip arthroplasty on the right but appears normal.  The left hip does have severe end-stage arthritis with complete loss of the joint space and flattening of the femoral head.   I have reviewed labs over the past several yrs. Her baseline Hb is 10-11 for the past 4/5 yrs Most recent iron panel not indicative of IDA, ferritin is 88, if at all mild iron deficiency. B12 and folate normal.   All questions were answered.  The patient knows to call the clinic with any problems, questions or concerns. I spent 45 minutes in the care of this patient including H and P, review of records, counseling and coordination of care.     Benay Pike, MD 05/24/2020 11:16 AM

## 2020-05-27 LAB — KAPPA/LAMBDA LIGHT CHAINS
Kappa free light chain: 26.1 mg/L — ABNORMAL HIGH (ref 3.3–19.4)
Kappa, lambda light chain ratio: 1.63 (ref 0.26–1.65)
Lambda free light chains: 16 mg/L (ref 5.7–26.3)

## 2020-05-27 LAB — PROTEIN ELECTROPHORESIS, SERUM
A/G Ratio: 1 (ref 0.7–1.7)
Albumin ELP: 3.7 g/dL (ref 2.9–4.4)
Alpha-1-Globulin: 0.2 g/dL (ref 0.0–0.4)
Alpha-2-Globulin: 1.1 g/dL — ABNORMAL HIGH (ref 0.4–1.0)
Beta Globulin: 1.4 g/dL — ABNORMAL HIGH (ref 0.7–1.3)
Gamma Globulin: 1 g/dL (ref 0.4–1.8)
Globulin, Total: 3.7 g/dL (ref 2.2–3.9)
Total Protein ELP: 7.4 g/dL (ref 6.0–8.5)

## 2020-06-07 ENCOUNTER — Encounter (HOSPITAL_COMMUNITY): Payer: Self-pay | Admitting: Hematology and Oncology

## 2020-06-07 ENCOUNTER — Other Ambulatory Visit: Payer: Self-pay

## 2020-06-07 ENCOUNTER — Inpatient Hospital Stay (HOSPITAL_COMMUNITY): Payer: 59 | Attending: Hematology and Oncology | Admitting: Hematology and Oncology

## 2020-06-07 VITALS — BP 144/85 | HR 91 | Temp 97.1°F | Resp 16 | Wt 205.6 lb

## 2020-06-07 DIAGNOSIS — Z841 Family history of disorders of kidney and ureter: Secondary | ICD-10-CM | POA: Diagnosis not present

## 2020-06-07 DIAGNOSIS — Z836 Family history of other diseases of the respiratory system: Secondary | ICD-10-CM | POA: Insufficient documentation

## 2020-06-07 DIAGNOSIS — I10 Essential (primary) hypertension: Secondary | ICD-10-CM | POA: Diagnosis not present

## 2020-06-07 DIAGNOSIS — Z888 Allergy status to other drugs, medicaments and biological substances status: Secondary | ICD-10-CM | POA: Insufficient documentation

## 2020-06-07 DIAGNOSIS — Z803 Family history of malignant neoplasm of breast: Secondary | ICD-10-CM | POA: Insufficient documentation

## 2020-06-07 DIAGNOSIS — Z79899 Other long term (current) drug therapy: Secondary | ICD-10-CM | POA: Insufficient documentation

## 2020-06-07 DIAGNOSIS — Z8249 Family history of ischemic heart disease and other diseases of the circulatory system: Secondary | ICD-10-CM | POA: Diagnosis not present

## 2020-06-07 DIAGNOSIS — D649 Anemia, unspecified: Secondary | ICD-10-CM | POA: Diagnosis present

## 2020-06-07 DIAGNOSIS — Z808 Family history of malignant neoplasm of other organs or systems: Secondary | ICD-10-CM | POA: Diagnosis not present

## 2020-06-07 DIAGNOSIS — Z806 Family history of leukemia: Secondary | ICD-10-CM | POA: Diagnosis not present

## 2020-06-07 DIAGNOSIS — Z881 Allergy status to other antibiotic agents status: Secondary | ICD-10-CM | POA: Diagnosis not present

## 2020-06-07 NOTE — Progress Notes (Signed)
[name] CONSULT NOTE  Patient Care Team: Audley Hose, MD as PCP - General (Internal Medicine)  CHIEF COMPLAINTS/PURPOSE OF CONSULTATION:  Anemia  ASSESSMENT & PLAN:  No problem-specific Assessment & Plan notes found for this encounter.  No orders of the defined types were placed in this encounter.  This is a very pleasant 70 yr old female patient with normocytic normochromic anemia  1. Normocytic normochromic anemia. Possible differential given the chronicity is anemia of chronic disease. No evidence of hemolysis or monoclonal protein. No concern for CKD I would recommend FU with GI for consideration of repeat scope. She is agreeable to these recommendations. I dont believe there is an immediate need for BMB RTC in 3 months  2.Essential HTN, well controlled Continue current medication  3. Age appropriate cancer screening   HISTORY OF PRESENTING ILLNESS:   [name] [name] 70 y.o. female is here because of anemia.  [name] [name] arrived to the appointment by herself. She reports no symptoms from this anemia. She doesn't remember being treated for anemia. She has not noted any blood in stool or melena. She denies any vaginal bleeding. She had hip replacement last yr. No dietary restrictions. She feels well, energy and appetite are normal. No new neurologic symptoms. No change in breathing, bowel habits and urinary habits.  Since last visit, she went to see her PCP. No other complaints, she feels well, energy is great. Rest of the pertinent 10 point ROS reviewed and negative.  REVIEW OF SYSTEMS:   Constitutional: Denies fevers, chills or abnormal night sweats Eyes: Denies blurriness of vision, double vision or watery eyes Ears, nose, mouth, throat, and face: Denies mucositis or sore throat Respiratory: Denies cough, dyspnea or wheezes Cardiovascular: Denies palpitation, chest discomfort or lower extremity swelling Gastrointestinal:  Denies nausea,  heartburn or change in bowel habits Skin: Denies abnormal skin rashes Lymphatics: Denies new lymphadenopathy or easy bruising Neurological:Denies numbness, tingling or new weaknesses Behavioral/Psych: Mood is stable, no new changes  All other systems were reviewed with the patient and are negative.  MEDICAL HISTORY:  Past Medical History:  Diagnosis Date  . Allergy   . Anemia   . Arthritis   . Asthma    as child only  . Back pain   . Diabetes mellitus without complication ([place])   . Hypertension   . Hypothyroidism     SURGICAL HISTORY: Past Surgical History:  Procedure Laterality Date  . ABDOMINAL HYSTERECTOMY  1989   fibroid-no longer needs pap  . [place]   DDD  . CARPAL TUNNEL RELEASE Left 05/28/2016   Procedure: CARPAL TUNNEL RELEASE;  Surgeon: Daryll Brod, MD;  Location: [facility];  Service: Orthopedics;  Laterality: Left;  . CARPAL TUNNEL RELEASE Right 03/17/2018   Procedure: CARPAL TUNNEL RELEASE;  Surgeon: Daryll Brod, MD;  Location: [facility];  Service: Orthopedics;  Laterality: Right;  . TOTAL HIP ARTHROPLASTY Right 11/18/2018   Procedure: RIGHT TOTAL HIP ARTHROPLASTY ANTERIOR APPROACH;  Surgeon: Mcarthur Rossetti, MD;  Location: [facility];  Service: Orthopedics;  Laterality: Right;  . TUBAL LIGATION      SOCIAL HISTORY: Social History   Socioeconomic History  . Marital status: Divorced    Spouse name: Not on file  . Number of children: Not on file  . Years of education: Not on file  . Highest education level: Not on file  Occupational History  . Not on file  Tobacco Use  . Smoking  status: Never Smoker  . Smokeless tobacco: Never Used  Vaping Use  . Vaping Use: Never used  Substance and Sexual Activity  . Alcohol use: Yes    Comment: social  . Drug use: No  . Sexual activity: Not Currently  Other Topics Concern  . Not on file  Social History Narrative   High school graduate, disabled from back pain.  Divorced, has an adult son in [place].   Social Determinants of Health   Financial Resource Strain: Not on file  Food Insecurity: Not on file  Transportation Needs: Not on file  Physical Activity: Inactive  . Days of Exercise per Week: 0 days  . Minutes of Exercise per Session: 0 min  Stress: Not on file  Social Connections: Not on file  Intimate Partner Violence: Not At Risk  . Fear of Current or Ex-Partner: No  . Emotionally Abused: No  . Physically Abused: No  . Sexually Abused: No    FAMILY HISTORY: Family History  Problem Relation Age of Onset  . Asthma Mother   . Cancer Mother        AML  . Leukemia Mother   . Heart disease Father   . Hypertension Father   . Kidney disease Father   . Breast cancer Maternal Aunt     ALLERGIES:  is allergic to gabapentin, celecoxib, metformin, and flagyl [metronidazole].  MEDICATIONS:  Current Outpatient Medications  Medication Sig Dispense Refill  . Acetaminophen-Codeine 300-30 MG tablet acetaminophen 300 mg-codeine 30 mg tablet    . albuterol (VENTOLIN HFA) 108 (90 Base) MCG/ACT inhaler INHALE 2 PUFFS EVERY 4THOURS AS NEEDED    . amLODipine (NORVASC) 10 MG tablet Take 10 mg by mouth daily.    . calcium carbonate (OSCAL) 1500 (600 Ca) MG TABS tablet Take by mouth.    . celecoxib (CELEBREX) 200 MG capsule TAKE 1 CAPSULE DAILY 30 capsule 0  . cyanocobalamin 1000 MCG tablet cyanocobalamin (vit B-12) 1,000 mcg tablet  Take 1 tablet every day by oral route for 90 days.    . cyclobenzaprine (FLEXERIL) 5 MG tablet Take 5 mg by mouth 3 (three) times daily as needed for muscle spasms.    . diphenhydrAMINE (BENADRYL) 25 MG tablet Take 0.5 tablets (12.5 mg total) by mouth at bedtime as needed (cramping). 30 tablet 0  . glimepiride (AMARYL) 1 MG tablet glimepiride 1 mg tablet  TAKE 1/2 TABLET EVERY DAY    . icosapent Ethyl (VASCEPA) 1 g capsule TAKE 2 CAPSULES BY MOUTH 2 TIMES DAILY.    . iron polysaccharides (NIFEREX) 150 MG capsule  iFerex 150 150 mg iron capsule    . levothyroxine (SYNTHROID) 75 MCG tablet levothyroxine 75 mcg tablet    . Multiple Vitamins-Minerals (CERTAVITE SENIOR/ANTIOXIDANT) TABS Take by mouth.    Marland Kitchen MYRBETRIQ 25 MG TB24 tablet Take 25 mg by mouth daily.    . nabumetone (RELAFEN) 500 MG tablet nabumetone 500 mg tablet    . pramipexole (MIRAPEX) 0.125 MG tablet     . pregabalin (LYRICA) 50 MG capsule pregabalin 50 mg capsule  TAKE 1 CAPSULE BY MOUTH AT BEDTIME.    Marland Kitchen Prenatal 27-1 MG TABS Take 1 tablet by mouth daily.    Marland Kitchen rOPINIRole (REQUIP) 1 MG tablet ropinirole 1 mg tablet  Take 1 tablet 3 times a day by oral route for 30 days.    . Zoster Vaccine Adjuvanted [facility]) injection Shingrix (PF) 50 mcg/0.5 mL intramuscular suspension, kit     No current facility-administered medications for  this visit.     PHYSICAL EXAMINATION: ECOG PERFORMANCE STATUS: 0 - Asymptomatic  Vitals:   06/07/20 1323  BP: (!) 144/85  Pulse: 91  Resp: 16  Temp: (!) 97.1 F (36.2 C)  SpO2: 96%   Filed Weights   06/07/20 1323  Weight: 205 lb 9.6 oz (93.3 kg)    GENERAL:alert, no distress and comfortable SKIN: skin color, texture, turgor are normal, no rashes or significant lesions EYES: normal, conjunctiva are pink and non-injected, sclera clear OROPHARYNX:no exudate, no erythema and lips, buccal mucosa, and tongue normal  NECK: supple, thyroid normal size, non-tender, without nodularity LYMPH:  no palpable lymphadenopathy in the cervical, axillary or inguinal LUNGS: clear to auscultation and percussion with normal breathing effort HEART: regular rate & rhythm and no murmurs and no lower extremity edema ABDOMEN:abdomen soft, non-tender and normal bowel sounds Musculoskeletal:no cyanosis of digits and no clubbing  PSYCH: alert & oriented x 3 with fluent speech NEURO: no focal motor/sensory deficits  LABORATORY DATA:  I have reviewed the data as listed Lab Results  Component Value Date   WBC 10.0  05/24/2020   HGB 10.6 (L) 05/24/2020   HCT 35.0 (L) 05/24/2020   MCV 88.4 05/24/2020   PLT 408 (H) 05/24/2020     Chemistry      Component Value Date/Time   NA 139 05/24/2020 1146   NA 138 03/11/2016 1114   K 3.7 05/24/2020 1146   CL 107 05/24/2020 1146   CO2 23 05/24/2020 1146   BUN 12 05/24/2020 1146   BUN 20 03/11/2016 1114   CREATININE 0.75 05/24/2020 1146   CREATININE 1.03 (H) 09/28/2019 1249      Component Value Date/Time   CALCIUM 9.3 05/24/2020 1146   ALKPHOS 44 05/24/2020 1146   AST 20 05/24/2020 1146   ALT 14 05/24/2020 1146   BILITOT 0.5 05/24/2020 1146       RADIOGRAPHIC STUDIES: I have personally reviewed the radiological images as listed and agreed with the findings in the report. US Guided Needle Placement - No Linked Charges  Result Date: 05/15/2020 Ultrasound guided injection is preferred based studies that show increased duration, increased effect, greater accuracy, decreased procedural pain, increased response rate, and decreased cost with ultrasound guided versus blind injection.   Verbal informed consent obtained.  Time-out conducted.  Noted no overlying erythema, induration, or other signs of local infection. Ultrasound-guided left hip injection: After sterile prep with Betadine, injected 4 cc 0.25% bupivacaine without epinephrine and 6 mg betamethasone using a 22-gauge spinal needle, passing the needle through the iliofemoral ligament into the femoral head/neck junction.  Injectate seen filling joint capsule.    XR HIP UNILAT W OR W/O PELVIS 1V LEFT  Result Date: 05/15/2020 An AP pelvis lateral left hip shows a total hip arthroplasty on the right but appears normal.  The left hip does have severe end-stage arthritis with complete loss of the joint space and flattening of the femoral head.   I have reviewed labs over the past several yrs. Her baseline Hb is 10-11 for the past 4/5 yrs Most recent iron panel not indicative of IDA, ferritin is 88, if at all  mild iron deficiency. B12 and folate normal. No hemolysis SPEP normal.  All questions were answered. The patient knows to call the clinic with any problems, questions or concerns. I spent 20 minutes in the care of this patient including H and P, review of records, counseling and coordination of care.     Benay Pike, MD  06/07/2020 1:40 PM

## 2020-06-10 ENCOUNTER — Telehealth: Payer: Self-pay | Admitting: Hematology and Oncology

## 2020-06-10 NOTE — Telephone Encounter (Signed)
Scheduled appointment per 03/04 schedule message. Contacted patient, patient is aware. 

## 2020-06-14 ENCOUNTER — Other Ambulatory Visit: Payer: Self-pay | Admitting: Orthopaedic Surgery

## 2020-06-27 ENCOUNTER — Ambulatory Visit (INDEPENDENT_AMBULATORY_CARE_PROVIDER_SITE_OTHER): Payer: 59 | Admitting: Internal Medicine

## 2020-06-27 ENCOUNTER — Other Ambulatory Visit: Payer: Self-pay

## 2020-06-27 ENCOUNTER — Other Ambulatory Visit: Payer: Self-pay | Admitting: Internal Medicine

## 2020-06-27 ENCOUNTER — Encounter: Payer: Self-pay | Admitting: Internal Medicine

## 2020-06-27 VITALS — BP 124/78 | HR 93 | Ht 67.5 in | Wt 198.8 lb

## 2020-06-27 DIAGNOSIS — M1712 Unilateral primary osteoarthritis, left knee: Secondary | ICD-10-CM

## 2020-06-27 DIAGNOSIS — M609 Myositis, unspecified: Secondary | ICD-10-CM | POA: Diagnosis not present

## 2020-06-27 DIAGNOSIS — E039 Hypothyroidism, unspecified: Secondary | ICD-10-CM

## 2020-06-27 DIAGNOSIS — M48062 Spinal stenosis, lumbar region with neurogenic claudication: Secondary | ICD-10-CM

## 2020-06-27 DIAGNOSIS — M5440 Lumbago with sciatica, unspecified side: Secondary | ICD-10-CM | POA: Diagnosis not present

## 2020-06-27 DIAGNOSIS — G8929 Other chronic pain: Secondary | ICD-10-CM

## 2020-06-27 DIAGNOSIS — E559 Vitamin D deficiency, unspecified: Secondary | ICD-10-CM

## 2020-06-27 MED ORDER — LIDOCAINE HCL 1 % IJ SOLN
3.0000 mL | INTRAMUSCULAR | Status: AC | PRN
  Administered 2020-06-27: 3 mL

## 2020-06-27 MED ORDER — TRIAMCINOLONE ACETONIDE 40 MG/ML IJ SUSP
40.0000 mg | INTRAMUSCULAR | Status: AC | PRN
  Administered 2020-06-27: 40 mg via INTRA_ARTICULAR

## 2020-06-27 NOTE — Patient Instructions (Addendum)
Joint Steroid Injection A joint steroid injection is a procedure to relieve swelling and pain in a joint. Steroids are medicines that reduce inflammation. In this procedure, your health care provider uses a syringe and a needle to inject a steroid medicine into a painful and inflamed joint. A pain-relieving medicine (anesthetic) may be injected along with the steroid. In some cases, your health care provider may use an imaging technique such as ultrasound or fluoroscopy to guide the injection. Joints that are often treated with steroid injections include the knee, shoulder, hip, and spine. These injections may also be used in the elbow, ankle, and joints of the hands or feet. You may have joint steroid injections as part of your treatment for inflammation caused by:  Gout.  Rheumatoid arthritis.  Advanced wear-and-tear arthritis (osteoarthritis).  Tendinitis.  Bursitis. Joint steroid injections may be repeated, but having them too often can damage a joint or the skin over the joint. You should not have joint steroid injections less than 6 weeks apart or more than four times a year. Tell a health care provider about:  Any allergies you have.  All medicines you are taking, including vitamins, herbs, eye drops, creams, and over-the-counter medicines.  Any problems you or family members have had with anesthetic medicines.  Any blood disorders you have.  Any surgeries you have had.  Any medical conditions you have.  Whether you are pregnant or may be pregnant. What are the risks? Generally, this is a safe treatment. However, problems may occur, including:  Infection.  Bleeding.  Allergic reactions to medicines.  Damage to the joint or tissues around the joint.  Thinning of skin or loss of skin color over the joint.  Temporary flushing of the face or chest.  Temporary increase in pain.  Temporary increase in blood sugar.  Failure to relieve inflammation or pain. What  happens before the treatment? Medicines Ask your health care provider about:  Changing or stopping your regular medicines. This is especially important if you are taking diabetes medicines or blood thinners.  Taking medicines such as aspirin and ibuprofen. These medicines can thin your blood. Do not take these medicines unless your health care provider tells you to take them.  Taking over-the-counter medicines, vitamins, herbs, and supplements. General instructions  You may have imaging tests of your joint.  Ask your health care provider if you can drive yourself home after the procedure. What happens during the treatment?  Your health care provider will position you for the injection and locate the injection site over your joint.  The skin over the joint will be cleaned with a germ-killing soap.  Your health care provider may: ? Spray a numbing solution (topical anesthetic) over the injection site. ? Inject a local anesthetic under the skin above your joint.  The needle will be placed through your skin into your joint. Your health care provider may use imaging to guide the needle to the right spot for the injection. If imaging is used, a special contrast dye may be injected to confirm that the needle is in the correct location.  The steroid medicine will be injected into your joint.  Anesthetic may be injected along with the steroid. This may be a medicine that relieves pain for a short time (short-acting anesthetic) or for a longer time (long-acting anesthetic).  The needle will be removed, and an adhesive bandage (dressing) will be placed over the injection site. The procedure may vary among health care providers and hospitals.  What can I expect after the treatment?  You will be able to go home after the treatment.  It is normal to feel slight flushing for a few days after the injection.  After the treatment, it is common to have an increase in joint pain after the  anesthetic has worn off. This may happen about an hour after a short-acting anesthetic or about 8 hours after a longer-acting anesthetic.  You should begin to feel relief from joint pain and swelling after 24 to 48 hours. Contact your health care provider if you do not begin to feel relief after 2 days. Follow these instructions at home: Injection site care  Leave the adhesive dressing over your injection site in place until your health care provider says you can remove it.  Check your injection site every day for signs of infection. Check for: ? More redness, swelling, or pain. ? Fluid or blood. ? Warmth. ? Pus or a bad smell. Activity  Return to your normal activities as told by your health care provider. Ask your health care provider what activities are safe for you. You may be asked to limit activities that put stress on the joint for a few days.  Do joint exercises as told by your health care provider.  Do not take baths, swim, or use a hot tub until your health care provider approves. Ask your health care provider if you may take showers. You may only be allowed to take sponge baths. Managing pain, stiffness, and swelling  If directed, put ice on the joint. To do this: ? Put ice in a plastic bag. ? Place a towel between your skin and the bag. ? Leave the ice on for 20 minutes, 2-3 times a day. ? Remove the ice if your skin turns bright red. This is very important. If you cannot feel pain, heat, or cold, you have a greater risk of damage to the area.  Raise (elevate) your joint above the level of your heart when you are sitting or lying down.   General instructions  Take over-the-counter and prescription medicines only as told by your health care provider.  Do not use any products that contain nicotine or tobacco, such as cigarettes, e-cigarettes, and chewing tobacco. These can delay joint healing. If you need help quitting, ask your health care provider.  If you have  diabetes, be aware that your blood sugar may be slightly elevated for several days after the injection.  Keep all follow-up visits. This is important. Contact a health care provider if you have:  Chills or a fever.  Any signs of infection at your injection site.  Increased pain or swelling or no relief after 2 days. Summary  A joint steroid injection is a treatment to relieve pain and swelling in a joint.  Steroids are medicines that reduce inflammation. Your health care provider may add an anesthetic along with the steroid.  You may have joint steroid injections as part of your arthritis treatment.  Joint steroid injections may be repeated, but having them too often can damage a joint or the skin over the joint.  Contact your health care provider if you have a fever, chills, or signs of infection, or if you get no relief from joint pain or swelling. This information is not intended to replace advice given to you by your health care provider. Make sure you discuss any questions you have with your health care provider. Document Revised: 09/01/2019 Document Reviewed: 09/01/2019 [employer] Patient Education  [place].    Aldolase Test Why am I having this test? An aldolase test is used to help diagnose and monitor disorders that affect the muscles or liver. What is being tested? This test measures aldolase in your blood. Aldolase is a natural chemical (enzyme) that is found in most tissues of the body. It helps to convert blood sugar (glucose) into energy. The amount of aldolase in the blood increases when there is muscle or liver damage. Many different medicines can also cause increased levels of this enzyme. What kind of sample is taken? A blood sample is required for this test. It is usually collected by inserting a needle into a blood vessel.   How do I prepare for this test?  Follow instructions from your health care provider about eating or drinking restrictions. You  may need to stop eating or drinking (may need to fast) for a short period of time before the test to get more accurate results.  On the day before the test and the day of the test, avoid activities and exercises that take a lot of effort. Tell a health care provider about:  All medicines you are taking, including vitamins, herbs, eye drops, creams, and over-the-counter medicines.  Any medical conditions you have. How are the results reported? Your test results will be reported as values. Your health care provider will compare your results to normal ranges that were established after testing a large group of people (reference ranges). Reference ranges may vary among labs and hospitals. For this test, common reference ranges are:  Adult: 1-7.5 units/L.  Children: ? Newborn to 30 days: 6-32 units/L. ? Age 82 month to 6 years: 3-12 units/L. ? Age 58-17 years: 3.3-9.7 units/L. What do the results mean? If results are within the reference range, they are considered normal.  Increased levels of aldolase may be a sign of:  A muscular disease, such as muscular dystrophy, dermatomyositis, or polymyositis.  Muscle injury.  Liver disease.  Heart attack.  Anemia.  An infection, such as trichinosis or mononucleosis. Decreased levels of aldolase may be a sign of:  Muscle-wasting diseases.  Being unable to absorb a natural sugar that is in things like fruit, fruit juices, and honey (hereditary fructose intolerance).  Late-stage muscular dystrophy (MD). Talk with your health care provider about what your results mean. Questions to ask your health care provider Ask your health care provider, or the department that is doing the test:  When will my results be ready?  How will I get my results?  What are my treatment options?  What other tests do I need?  What are my next steps? Summary  An aldolase test is used to help diagnose and monitor disorders that affect the muscles or  liver.  Aldolase is a natural chemical (enzyme) that is found in most tissues of the body. The amount of aldolase in the blood increases when there is muscle or liver damage.  For this test, a blood sample is usually collected by inserting a needle into a blood vessel.  Talk with your health care provider about what your results mean.    Erythrocyte Sedimentation Rate Test Why am I having this test? The erythrocyte sedimentation rate (ESR) test is used to help find illnesses related to:  Sudden (acute) or long-term (chronic) infections.  Inflammation.  The body's disease-fighting system attacking healthy cells (autoimmune diseases).  Cancer.  Tissue death. If you have symptoms that may be related to any of these illnesses, your  health care provider may do an ESR test before doing more specific tests. If you have an inflammatory immune disease, such as rheumatoid arthritis, you may have this test to help monitor your therapy. What is being tested? This test measures how long it takes for your red blood cells (erythrocytes) to settle in a solution over a certain amount of time (sedimentation rate). When you have an infection or inflammation, your red blood cells clump together and settle faster. The sedimentation rate provides information about how much inflammation is present in the body. What kind of sample is taken? A blood sample is required for this test. It is usually collected by inserting a needle into a blood vessel.   How do I prepare for this test? Follow any instructions from your health care provider about changing or stopping your regular medicines. Tell a health care provider about:  Any allergies you have.  All medicines you are taking, including vitamins, herbs, eye drops, creams, and over-the-counter medicines.  Any blood disorders you have.  Any surgeries you have had.  Any medical conditions you have, such as thyroid or kidney disease.  Whether you are  pregnant or may be pregnant. How are the results reported? Your results will be reported as a value that measures sedimentation rate in millimeters per hour (mm/hr). Your health care provider will compare your results to normal ranges that were established after testing a large group of people (reference values). Reference values may vary among labs and hospitals. For this test, common reference values, which vary by age and gender, are:  Newborn: 0-2 mm/hr.  Child, up to puberty: 0-10 mm/hr.  Female: ? Under 50 years: 0-20 mm/hr. ? 50-85 years: 0-30 mm/hr. ? Over 85 years: 0-42 mm/hr.  Female: ? Under 50 years: 0-15 mm/hr. ? 50-85 years: 0-20 mm/hr. ? Over 85 years: 0-30 mm/hr. Certain conditions or medicines may cause ESR levels to be falsely lower or higher, such as:  Pregnancy.  Obesity.  Steroids, birth control pills, and blood thinners.  Thyroid or kidney disease. What do the results mean? Results that are within reference values are considered normal, meaning that the level of inflammation in your body is healthy. High ESR levels mean that there is inflammation in your body. You will have more tests to help make a diagnosis. Inflammation may result from many different conditions or injuries. Talk with your health care provider about what your results mean. Questions to ask your health care provider Ask your health care provider, or the department that is doing the test:  When will my results be ready?  How will I get my results?  What are my treatment options?  What other tests do I need?  What are my next steps? Summary  The erythrocyte sedimentation rate (ESR) test is used to help find illnesses associated with sudden (acute) or long-term (chronic) infections, inflammation, autoimmune diseases, cancer, or tissue death.  If you have symptoms that may be related to any of these illnesses, your health care provider may do an ESR test before doing more specific tests.  If you have an inflammatory immune disease, such as rheumatoid arthritis, you may have this test to help monitor your therapy.  This test measures how long it takes for your red blood cells (erythrocytes) to settle in a solution over a certain amount of time (sedimentation rate). This provides information about how much inflammation is present in the body.    Vitamin D Deficiency Vitamin D deficiency is when  your body does not have enough vitamin D. Vitamin D is important to your body for many reasons:  It helps the body absorb two important minerals--calcium and phosphorus.  It plays a role in bone health.  It may help to prevent some diseases, such as diabetes and multiple sclerosis.  It plays a role in muscle function, including heart function. If vitamin D deficiency is severe, it can cause a condition in which your bones become soft. In adults, this condition is called osteomalacia. In children, this condition is called rickets. What are the causes? This condition may be caused by:  Not eating enough foods that contain vitamin D.  Not getting enough natural sun exposure.  Having certain digestive system diseases that make it difficult for your body to absorb vitamin D. These diseases include Crohn's disease, chronic pancreatitis, and cystic fibrosis.  Having a surgery in which a part of the stomach or a part of the small intestine is removed.  Having chronic kidney disease or liver disease. What increases the risk? You are more likely to develop this condition if you:  Are older.  Do not spend much time outdoors.  Live in a long-term care facility.  Have had broken bones.  Have weak or thin bones (osteoporosis).  Have a disease or condition that changes how the body absorbs vitamin D.  Have dark skin.  Take certain medicines, such as steroid medicines or certain seizure medicines.  Are overweight or obese. What are the signs or symptoms? In mild cases of vitamin  D deficiency, there may not be any symptoms. If the condition is severe, symptoms may include:  Bone pain.  Muscle pain.  Falling often.  Broken bones caused by a minor injury. How is this diagnosed? This condition may be diagnosed with blood tests. Imaging tests such as X-rays may also be done to look for changes in the bone. How is this treated? Treatment for this condition may depend on what caused the condition. Treatment options include:  Taking vitamin D supplements. Your health care provider will suggest what dose is best for you.  Taking a calcium supplement. Your health care provider will suggest what dose is best for you. Follow these instructions at home: Eating and drinking  Eat foods that contain vitamin D. Choices include: ? Fortified dairy products, cereals, or juices. Fortified means that vitamin D has been added to the food. Check the label on the package to see if the food is fortified. ? Fatty fish, such as salmon or trout. ? Eggs. ? Oysters. ? Mushrooms. The items listed above may not be a complete list of recommended foods and beverages. Contact a dietitian for more information.   General instructions  Take medicines and supplements only as told by your health care provider.  Get regular, safe exposure to natural sunlight.  Do not use a tanning bed.  Maintain a healthy weight. Lose weight if needed.  Keep all follow-up visits as told by your health care provider. This is important. How is this prevented? You can get vitamin D by:  Eating foods that naturally contain vitamin D.  Eating or drinking products that have been fortified with vitamin D, such as cereals, juices, and dairy products (including milk).  Taking a vitamin D supplement or a multivitamin supplement that contains vitamin D.  Being in the sun. Your body naturally makes vitamin D when your skin is exposed to sunlight. Your body changes the sunlight into a form of the vitamin that it  can use. Contact a health care provider if:  Your symptoms do not go away.  You feel nauseous or you vomit.  You have fewer bowel movements than usual or are constipated. Summary  Vitamin D deficiency is when your body does not have enough vitamin D.  Vitamin D is important to your body for good bone health and muscle function, and it may help prevent some diseases.  Vitamin D deficiency is primarily treated through supplementation. Your health care provider will suggest what dose is best for you.  You can get vitamin D by eating foods that contain vitamin D, by being in the sun, and by taking a vitamin D supplement or a multivitamin supplement that contains vitamin D.

## 2020-06-27 NOTE — Progress Notes (Addendum)
Office Visit Note  Patient: [name] [name]             Date of Birth: 09/17/1950           MRN: [id number]             PCP: Audley Hose, MD Referring: Audley Hose, MD Visit Date: 06/27/2020 Occupation: Retired Engineer, manufacturing systems  Subjective:  New Patient (Initial Visit) (Patient complains of neck, right shoulder, low back, left hip, and bilateral lower leg pain and stiffness. )   History of Present Illness: [name] [name] is a 70 y.o. female with a history of type 2 diabetes with associated peripheral sensory neuropathy, acquired hypothyroidism, and mild intermittent asthma here for evaluation of joint pain of multiple sites and myalgias with mildly elevated CK.  She has very longstanding degenerative joint disease especially in the low back dating back at least 30 years with multiple previous low back surgery.  She is also had replacement of the right hip between 1 to 2 years ago for severe osteoarthritis as discussed plan for future left hip replacement needed as well.  She is taken NSAIDs for long time which are only mildly helpful for her current symptoms.  She takes Cymbalta that is partially helpful for her neuropathy symptoms.  She worked with physical therapy after her hip replacement but not anymore she is not especially active due to increased joint pain with standing for long times.  She has not noticed any local joint weakness and denies any falls.  She has had a previous injections in her back and in her knee she says that one time the knee injection helped for about 3 years the other time it did not do anything.  During the past 6 months she has noticed an overall increase in her symptoms and also pain that extends down the joint to also have aching pain throughout the muscle areas 2.  Laboratory work-up has shown mildly elevated CK levels and inflammatory markers.  She has seen some swelling on the right lateral right ankle otherwise no visible changes.  She denies any  significant change in sensation or strength during these months. She denies any rashes.  Labs reviewed 05/2020 CK 196 ESR 75 CRP 26.1 CMP wnl CCP neg ANA neg  09/2019 CK 266  Activities of Daily Living:  Patient reports morning stiffness for several hours.   Patient Reports nocturnal pain.  Difficulty dressing/grooming: Denies Difficulty climbing stairs: Reports Difficulty getting out of chair: Reports Difficulty using hands for taps, buttons, cutlery, and/or writing: Denies  Review of Systems  Constitutional: Negative for fatigue.  HENT: Negative for mouth sores, mouth dryness and nose dryness.   Eyes: Positive for visual disturbance. Negative for pain, itching and dryness.  Respiratory: Positive for shortness of breath and difficulty breathing. Negative for cough and hemoptysis.   Cardiovascular: Negative for chest pain, palpitations and swelling in legs/feet.  Gastrointestinal: Negative for abdominal pain, blood in stool, constipation and diarrhea.  Endocrine: Negative for increased urination.  Genitourinary: Negative for painful urination.  Musculoskeletal: Positive for arthralgias, joint pain, joint swelling, myalgias, morning stiffness, muscle tenderness and myalgias. Negative for muscle weakness.  Skin: Negative for color change, rash and redness.  Allergic/Immunologic: Negative for susceptible to infections.  Neurological: Positive for numbness. Negative for dizziness, headaches, memory loss and weakness.  Hematological: Negative for swollen glands.  Psychiatric/Behavioral: Positive for sleep disturbance. Negative for confusion.    PMFS History:  Patient Active Problem List  Diagnosis Date Noted  . Vitamin D deficiency 06/27/2020  . Peripheral sensory neuropathy due to type 2 diabetes mellitus ([place]) 12/01/2019  . Mild intermittent asthma 12/01/2019  . Myositis 12/01/2019  . Unilateral primary osteoarthritis, left hip 03/06/2019  . Chronic low back pain 01/18/2019   . Status post total replacement of right hip 11/18/2018  . Unilateral primary osteoarthritis, right hip 09/29/2018  . Bilateral primary osteoarthritis of hip 05/25/2018  . Spinal stenosis of lumbar region with neurogenic claudication 04/20/2018  . Impingement syndrome of right shoulder 04/20/2018  . Type II diabetes mellitus, uncontrolled ([place]) 11/05/2017  . Mixed hyperlipidemia 06/04/2017  . Acquired hypothyroidism 03/05/2017  . Normocytic anemia 01/24/2017  . Bilateral carpal tunnel syndrome 05/06/2016  . Essential hypertension, benign 02/20/2016  . Primary osteoarthritis of left knee 05/24/2014  . Elevated blood pressure reading 09/29/2011  . Chronic back pain 09/29/2011  . Leg edema 09/29/2011  . Obesity with body mass index 30 or greater 09/29/2011    Past Medical History:  Diagnosis Date  . Allergy   . Anemia   . Arthritis   . Asthma    as child only  . Back pain   . Diabetes mellitus without complication ([place])   . Hypertension   . Hypothyroidism     Family History  Problem Relation Age of Onset  . Asthma Mother   . Cancer Mother        AML  . Leukemia Mother   . Heart disease Father   . Hypertension Father   . Kidney disease Father   . Heart attack Brother   . Breast cancer Maternal Aunt   . Multiple sclerosis Maternal Aunt    Past Surgical History:  Procedure Laterality Date  . ABDOMINAL HYSTERECTOMY  1989   fibroid-no longer needs pap  . [place]   DDD  . CARPAL TUNNEL RELEASE Left 05/28/2016   Procedure: CARPAL TUNNEL RELEASE;  Surgeon: Daryll Brod, MD;  Location: [facility];  Service: Orthopedics;  Laterality: Left;  . CARPAL TUNNEL RELEASE Right 03/17/2018   Procedure: CARPAL TUNNEL RELEASE;  Surgeon: Daryll Brod, MD;  Location: [facility];  Service: Orthopedics;  Laterality: Right;  . CARPAL TUNNEL RELEASE Left   . TOTAL HIP ARTHROPLASTY Right 11/18/2018   Procedure: RIGHT TOTAL HIP ARTHROPLASTY ANTERIOR  APPROACH;  Surgeon: Mcarthur Rossetti, MD;  Location: [facility];  Service: Orthopedics;  Laterality: Right;  . TUBAL LIGATION     Social History   Social History Narrative   High school graduate, disabled from back pain. Divorced, has an adult son in [place].   Immunization History  Administered Date(s) Administered  . Moderna Sars-Covid-2 Vaccination 06/26/2019, 07/24/2019, 02/05/2020, 02/27/2020  . Pneumococcal Polysaccharide-23 06/03/2017  . Tdap 06/03/2017  . Unspecified SARS-COV-2 Vaccination 06/26/2019, 07/24/2019  . Zoster Recombinat (Shingrix) 09/27/2018     Objective: Vital Signs: BP 124/78 (BP Location: Right Arm, Patient Position: Sitting, Cuff Size: Normal)   Pulse 93   Ht 5' 7.5" (1.715 m)   Wt 198 lb 12.8 oz (90.2 kg)   BMI 30.68 kg/m    Physical Exam Eyes:     Conjunctiva/sclera: Conjunctivae normal.  Cardiovascular:     Rate and Rhythm: Normal rate and regular rhythm.  Pulmonary:     Effort: Pulmonary effort is normal.     Breath sounds: Normal breath sounds.  Skin:    General: Skin is warm and dry.     Findings: No rash.  Neurological:  General: No focal deficit present.     Deep Tendon Reflexes: Reflexes abnormal.     Comments: Decreased knee jerk reflexes b/l  Psychiatric:        Mood and Affect: Mood normal.     Musculoskeletal Exam:  Neck full ROM, left tenderness to palpation at base of neck Shoulders full ROM no tenderness or swelling Elbows full ROM no tenderness or swelling Wrists full ROM no tenderness or swelling Fingers full ROM no tenderness or swelling Low back paraspinal muscle tenderness on right Right hip rotation normal, left hip severely reduced internal rotation Knees left flexion ROM very reduced to about 90 degrees, bony enlargement, no effusion palpable or seen on POCUS Ankles full ROM no tenderness or swelling   Investigation: No additional findings.  Imaging: No results found.  Recent Labs: Lab Results   Component Value Date   WBC 10.0 05/24/2020   HGB 10.6 (L) 05/24/2020   PLT 408 (H) 05/24/2020   NA 139 05/24/2020   K 3.7 05/24/2020   CL 107 05/24/2020   CO2 23 05/24/2020   GLUCOSE 114 (H) 05/24/2020   BUN 12 05/24/2020   CREATININE 0.75 05/24/2020   BILITOT 0.5 05/24/2020   ALKPHOS 44 05/24/2020   AST 20 05/24/2020   ALT 14 05/24/2020   PROT 7.8 05/24/2020   ALBUMIN 4.3 05/24/2020   CALCIUM 9.3 05/24/2020   GFRAA >60 09/30/2019    Speciality Comments: No specialty comments available.  Procedures:  Large Joint Inj: L knee on 06/27/2020 9:05 AM Indications: pain Details: 25 G needle, anteromedial approach Medications: 3 mL lidocaine 1 %; 40 mg triamcinolone acetonide 40 MG/ML Outcome: tolerated well, no immediate complications Consent was given by the patient. Immediately prior to procedure a time out was called to verify the correct patient, procedure, equipment, support staff and site/side marked as required. Patient was prepped and draped in the usual sterile fashion.     Allergies: Gabapentin, Celecoxib, Metformin, and Flagyl [metronidazole]   Assessment / Plan:     Visit Diagnoses: Myositis, unspecified myositis type, unspecified site - Plan: Rheumatoid factor, Sedimentation rate, Aldolase  Muscle pains in a wide distribution especially in back and shoulders but no weakness no swelling no abnormal reflexes. I do not see any changes suggestive for an inflammatory myopathy that would explain her elevated CK. Checking RF, ESR, aldolase.  Unilateral primary osteoarthritis, left knee - Plan: Large Joint Inj: L knee  Severely reduced left knee ROM with advanced OA in both hip and knee joints. Intrarticular steroid injection performed today.  Acquired hypothyroidism - Plan: TSH  Can be associated with myopathy with pain and abnormal CK values so checking TSH value.  Chronic bilateral low back pain with sciatica, sciatica laterality unspecified Spinal stenosis of  lumbar region with neurogenic claudication  Low back pain, chronic with degenerative changes seen on prior imaging. Symptoms are not suggestive for inflammatory back pain.  Vitamin D deficiency - Plan: VITAMIN D 25 Hydroxy (Vit-D Deficiency, Fractures)  History of low vitamin D and with advanced OA and symptoms will rechcek adequacy of replacement.  Orders: Orders Placed This Encounter  Procedures  . Large Joint Inj: L knee  . Rheumatoid factor  . Sedimentation rate  . Aldolase  . TSH  . VITAMIN D 25 Hydroxy (Vit-D Deficiency, Fractures)   No orders of the defined types were placed in this encounter.    Follow-Up Instructions: No follow-ups on file.   Collier Salina, MD  Note - This record has  been created using [employer].  Chart creation errors have been sought, but may not always  have been located. Such creation errors do not reflect on  the standard of medical care.

## 2020-06-28 LAB — TSH: TSH: 2.18 mIU/L (ref 0.40–4.50)

## 2020-06-28 LAB — ALDOLASE: Aldolase: 5.9 U/L (ref <=8.1)

## 2020-06-28 LAB — SEDIMENTATION RATE: Sed Rate: 46 mm/h — ABNORMAL HIGH (ref 0–30)

## 2020-06-28 LAB — VITAMIN D 25 HYDROXY (VIT D DEFICIENCY, FRACTURES): Vit D, 25-Hydroxy: 27 ng/mL — ABNORMAL LOW (ref 30–100)

## 2020-06-28 LAB — RHEUMATOID FACTOR: Rheumatoid fact SerPl-aCnc: <14 IU/mL (ref <14)

## 2020-07-01 NOTE — Progress Notes (Signed)
Test for rheumatoid arthritis is negative. The aldolase test is normal suggesting against current or ongoing muscle inflammation. Her sedimentation rate is trending downward compared to last year and last month. I do not see any specific evidence of an inflammatory condition needing new treatment at this time but we can take another look if symptoms worsen.  Vitamin D level is mildly low she should start taking 1000 units daily supplement or increase her current supplement by that amount.

## 2020-07-22 ENCOUNTER — Other Ambulatory Visit: Payer: Self-pay | Admitting: Orthopaedic Surgery

## 2020-07-22 NOTE — Telephone Encounter (Signed)
Ok to refill 

## 2020-07-30 ENCOUNTER — Telehealth: Payer: Self-pay | Admitting: Physical Medicine and Rehabilitation

## 2020-07-30 ENCOUNTER — Telehealth: Payer: Self-pay | Admitting: Orthopaedic Surgery

## 2020-07-30 NOTE — Telephone Encounter (Signed)
Patient called asked if Dr Alvester Morin give the gel injections in the hip? The number to contact patient is [phone]

## 2020-07-30 NOTE — Telephone Encounter (Signed)
Patient called asked if she can get the gel injection in her left hip?  The number to contact patient is [phone]

## 2020-07-30 NOTE — Telephone Encounter (Signed)
Unfortunately there are no insurance covered gel injections for the hip. Could talk to Lauren and see about a price to do the injection with gel (like what they do with knees) - better to have her talk to ortho surgeon.

## 2020-07-30 NOTE — Telephone Encounter (Signed)
Please advise 

## 2020-07-30 NOTE — Telephone Encounter (Signed)
No ma'am , insurance only covers knee

## 2020-07-31 ENCOUNTER — Telehealth: Payer: Self-pay | Admitting: Physical Medicine and Rehabilitation

## 2020-07-31 NOTE — Telephone Encounter (Signed)
Hey April, can you help me with this Auth#

## 2020-07-31 NOTE — Telephone Encounter (Signed)
Pt called and wasn't happy. She stated I didn't know what I was talking about because her cousin just received a gel injection in her hip with Dr. Alvester Morin this past Monday.  Pt said she was going to call and speak with her insurance company since they covered her cousin and she will give [place] a call back.

## 2020-07-31 NOTE — Telephone Encounter (Signed)
[name] [name] with the ptsinsurance called and would like for our office to send in a pre-autho for a gel injection in the hip. I made [name] [name] aware of the previous messages and she states she would like the pre-autho faxed so that if it is denied it will be documented that we tried everything.   Fax# [phone] CB# [phone]

## 2020-08-01 NOTE — Telephone Encounter (Signed)
We don't do gel injections for the hip

## 2020-08-19 ENCOUNTER — Telehealth: Payer: Self-pay | Admitting: Orthopaedic Surgery

## 2020-08-19 NOTE — Telephone Encounter (Signed)
Pt called stating someone from her insurance called our office and asked the name of the gel injection she was trying to get; and told our office to send the autho paperwork. Pt would like to know if this has been done, and if not she states she will just make an appt to speak with DR. Magnus Ivan himself.   [phone]

## 2020-08-20 NOTE — Telephone Encounter (Signed)
Talked with patient and she stated that authorization for hip injection needs to be sent to her insurance.  Stated that her insurance called to get the correct name of the hip injection.  Stated that she was saying the wrong injection.  Please advise.  Thank you

## 2020-08-22 NOTE — Telephone Encounter (Signed)
She just want [facility] to do the hip injection, not [facility] is what she said

## 2020-08-22 NOTE — Telephone Encounter (Signed)
I don't know what this patient wants. She has never seen Dr. Alvester Morin for her hip. She had an injection with Dr. Prince Rome. Does she need a repeat of that?

## 2020-08-22 NOTE — Telephone Encounter (Signed)
Patient had a left hip injection with ultrasound guidance on 2/22. Ok to schedule?

## 2020-08-22 NOTE — Telephone Encounter (Signed)
Please Advise

## 2020-08-23 NOTE — Telephone Encounter (Signed)
ok 

## 2020-08-23 NOTE — Telephone Encounter (Signed)
Scheduled for 6/2 at 1000.

## 2020-08-27 ENCOUNTER — Other Ambulatory Visit: Payer: Self-pay | Admitting: Internal Medicine

## 2020-08-27 ENCOUNTER — Other Ambulatory Visit: Payer: Self-pay | Admitting: Orthopaedic Surgery

## 2020-08-27 ENCOUNTER — Other Ambulatory Visit: Payer: Self-pay

## 2020-08-27 DIAGNOSIS — Z1231 Encounter for screening mammogram for malignant neoplasm of breast: Secondary | ICD-10-CM

## 2020-08-27 DIAGNOSIS — Z13820 Encounter for screening for osteoporosis: Secondary | ICD-10-CM

## 2020-08-27 MED ORDER — CELECOXIB 200 MG PO CAPS: 200.0000 mg | ORAL_CAPSULE | Freq: Every day | ORAL | 3 refills | Status: DC

## 2020-09-05 ENCOUNTER — Ambulatory Visit (INDEPENDENT_AMBULATORY_CARE_PROVIDER_SITE_OTHER): Payer: 59 | Admitting: Physical Medicine and Rehabilitation

## 2020-09-05 ENCOUNTER — Other Ambulatory Visit: Payer: Self-pay

## 2020-09-05 ENCOUNTER — Ambulatory Visit: Payer: Self-pay

## 2020-09-05 ENCOUNTER — Encounter: Payer: Self-pay | Admitting: Physical Medicine and Rehabilitation

## 2020-09-05 DIAGNOSIS — M25552 Pain in left hip: Secondary | ICD-10-CM | POA: Diagnosis not present

## 2020-09-05 MED ORDER — BUPIVACAINE HCL 0.25 % IJ SOLN
4.0000 mL | INTRAMUSCULAR | Status: AC | PRN
  Administered 2020-09-05: 4 mL via INTRA_ARTICULAR

## 2020-09-05 MED ORDER — TRIAMCINOLONE ACETONIDE 40 MG/ML IJ SUSP
60.0000 mg | INTRAMUSCULAR | Status: AC | PRN
  Administered 2020-09-05: 60 mg via INTRA_ARTICULAR

## 2020-09-05 NOTE — Progress Notes (Signed)
[name] [name] - 70 y.o. female MRN [id number]  Date of birth: 05-Jul-1950  Office Visit Note: Visit Date: 09/05/2020 PCP: Harvest Forest, MD Referred by: Harvest Forest, MD  Subjective: Chief Complaint  Patient presents with  . Left Hip - Pain   HPI:  [name] [name] is a 70 y.o. female who comes in today at the request of Dr. Doneen Poisson for planned Left anesthetic hip arthrogram with fluoroscopic guidance.  The patient has failed conservative care including home exercise, medications, time and activity modification.  This injection will be diagnostic and hopefully therapeutic.  Please see requesting physician notes for further details and justification.   ROS Otherwise per HPI.  Assessment & Plan: Visit Diagnoses:    ICD-10-CM   1. Pain in left hip  M25.552 Large Joint Inj: L hip joint    XR C-ARM NO REPORT    Plan: No additional findings.   Meds & Orders: No orders of the defined types were placed in this encounter.   Orders Placed This Encounter  Procedures  . Large Joint Inj: L hip joint  . XR C-ARM NO REPORT    Follow-up: Return if symptoms worsen or fail to improve, for Doneen Poisson, MD.   Procedures: Large Joint Inj: L hip joint on 09/05/2020 10:10 AM Indications: diagnostic evaluation and pain Details: 22 G 3.5 in needle, fluoroscopy-guided anterior approach  Arthrogram: No  Medications: 4 mL bupivacaine 0.25 %; 60 mg triamcinolone acetonide 40 MG/ML Outcome: tolerated well, no immediate complications  There was excellent flow of contrast producing a partial arthrogram of the hip. The patient did have relief of symptoms during the anesthetic phase of the injection. Procedure, treatment alternatives, risks and benefits explained, specific risks discussed. Consent was given by the patient. Immediately prior to procedure a time out was called to verify the correct patient, procedure, equipment, support staff and site/side marked as required.  Patient was prepped and draped in the usual sterile fashion.          Clinical History: MRI LUMBAR SPINE WITHOUT AND WITH CONTRAST  TECHNIQUE: Multiplanar and multiecho pulse sequences of the lumbar spine were obtained without and with intravenous contrast.  CONTRAST:  72mL MULTIHANCE GADOBENATE DIMEGLUMINE 529 MG/ML IV SOLN  COMPARISON:  Lumbar MRI 08/10/2009  FINDINGS: Segmentation:  Normal  Alignment: Mild retrolisthesis L1-2 and L2-3. 5 mm anterolisthesis L4-5 has progressed.  Vertebrae: Negative for fracture or mass. Fatty endplate changes at L5-S1 due to disc degeneration similar to the prior study.  Conus medullaris and cauda equina: Conus extends to the L1-2 level. Conus and cauda equina appear normal.  Paraspinal and other soft tissues: No retroperitoneal mass or adenopathy  Disc levels:  T12-L1: Small right paracentral disc protrusion without significant stenosis. Mild facet degeneration.  L1-2: Mild retrolisthesis. Disc bulging and facet degeneration without significant stenosis  L2-3: Progressive disc degeneration and retrolisthesis. Progressive facet degeneration. Moderate foraminal encroachment bilaterally left greater than right has progressed with potential for impingement of the L2 nerve roots especially on the left. Mild spinal stenosis has progressed.  L3-4: Diffuse disc bulging and facet hypertrophy has progressed. Severe spinal stenosis has progressed. Moderate right foraminal encroachment has progressed  L4-5: Progressive anterolisthesis. Severe facet degeneration has progressed. Moderate spinal stenosis similar to the prior study  L5-S1: Remote laminectomy on the right with epidural scarring around the right S1 nerve root and thecal sac unchanged. No recurrent disc protrusion or stenosis.  IMPRESSION: Mild spinal stenosis L2-3 has progressed. Progressive  disc and facet degeneration. Moderate foraminal encroachment left  greater than right has progressed in the interval  Severe spinal stenosis L3-4 has progressed. Moderate right foraminal encroachment also has progressed  Progressive anterolisthesis at L4-5 with moderate spinal stenosis similar to the prior study  Remote laminectomy right L5-S1 with epidural scar on the right. No recurrent disc protrusion or stenosis.   Electronically Signed   By: Marlan Palau M.D.   On: 08/21/2017 13:06     Objective:  VS:  HT:    WT:   BMI:     BP:   HR: bpm  TEMP: ( )  RESP:  Physical Exam   Imaging: XR C-ARM NO REPORT  Result Date: 09/05/2020 Please see Notes tab for imaging impression.

## 2020-09-05 NOTE — Progress Notes (Signed)
Pt state left hip pain. Pt state standing makes the pain worse. Pt state she takes over the counter pain meds to help ease her pain.  Numeric Pain Rating Scale and Functional Assessment Average Pain 8   In the last MONTH (on 0-10 scale) has pain interfered with the following?  1. General activity like being  able to carry out your everyday physical activities such as walking, climbing stairs, carrying groceries, or moving a chair?  Rating(9)    -BT, -Dye Allergies.

## 2020-09-09 ENCOUNTER — Telehealth: Payer: Self-pay

## 2020-09-09 NOTE — Telephone Encounter (Signed)
Pt last hip inj was on 09/05/20 Left hip pain. Please advise

## 2020-09-09 NOTE — Telephone Encounter (Signed)
Left hip injection 6/2. Please advise.

## 2020-09-09 NOTE — Telephone Encounter (Signed)
Patient called she stated she received a injection and she is still having pain, patient stated she isn't sure if she is having pain in her hip or back call back:[phone]

## 2020-09-10 NOTE — Telephone Encounter (Signed)
Is auth needed for L3 or L4 tf ? Scheduled for 6/16 with driver.

## 2020-09-18 ENCOUNTER — Ambulatory Visit: Payer: 59 | Admitting: Hematology and Oncology

## 2020-09-19 ENCOUNTER — Ambulatory Visit (INDEPENDENT_AMBULATORY_CARE_PROVIDER_SITE_OTHER): Payer: 59 | Admitting: Physical Medicine and Rehabilitation

## 2020-09-19 ENCOUNTER — Other Ambulatory Visit: Payer: Self-pay

## 2020-09-19 ENCOUNTER — Encounter: Payer: Self-pay | Admitting: Physical Medicine and Rehabilitation

## 2020-09-19 ENCOUNTER — Ambulatory Visit: Payer: Self-pay

## 2020-09-19 VITALS — BP 115/80 | HR 91

## 2020-09-19 DIAGNOSIS — M5416 Radiculopathy, lumbar region: Secondary | ICD-10-CM | POA: Diagnosis not present

## 2020-09-19 DIAGNOSIS — M48062 Spinal stenosis, lumbar region with neurogenic claudication: Secondary | ICD-10-CM | POA: Diagnosis not present

## 2020-09-19 MED ORDER — METHYLPREDNISOLONE ACETATE 80 MG/ML IJ SUSP
80.0000 mg | Freq: Once | INTRAMUSCULAR | Status: AC
  Administered 2020-09-19: 80 mg

## 2020-09-19 NOTE — Patient Instructions (Signed)

## 2020-09-19 NOTE — Procedures (Signed)
Lumbosacral Transforaminal Epidural Steroid Injection - Sub-Pedicular Approach with Fluoroscopic Guidance  Patient: [name] [name]      Date of Birth: 1950-08-19 MRN: [id number] PCP: Harvest Forest, MD      Visit Date: 09/19/2020   Universal Protocol:    Date/Time: 09/19/2020  Consent Given By: the patient  Position: PRONE  Additional Comments: Vital signs were monitored before and after the procedure. Patient was prepped and draped in the usual sterile fashion. The correct patient, procedure, and site was verified.   Injection Procedure Details:   Procedure diagnoses: Spinal stenosis of lumbar region with neurogenic claudication [M48.062]    Meds Administered:  Meds ordered this encounter  Medications   methylPREDNISolone acetate (DEPO-MEDROL) injection 80 mg    Laterality: Left  Location/Site:  L3-L4  Needle:5.0 in., 22 ga.  Short bevel or Quincke spinal needle  Needle Placement: Transforaminal  Findings:    -Comments: Excellent flow of contrast along the nerve, nerve root and into the epidural space.  Procedure Details: After squaring off the end-plates to get a true AP view, the C-arm was positioned so that an oblique view of the foramen as noted above was visualized. The target area is just inferior to the "nose of the scotty dog" or sub pedicular. The soft tissues overlying this structure were infiltrated with 2-3 ml. of 1% Lidocaine without Epinephrine.  The spinal needle was inserted toward the target using a "trajectory" view along the fluoroscope beam.  Under AP and lateral visualization, the needle was advanced so it did not puncture dura and was located close the 6 O'Clock position of the pedical in AP tracterory. Biplanar projections were used to confirm position. Aspiration was confirmed to be negative for CSF and/or blood. A 1-2 ml. volume of Isovue-250 was injected and flow of contrast was noted at each level. Radiographs were obtained for  documentation purposes.   After attaining the desired flow of contrast documented above, a 0.5 to 1.0 ml test dose of 0.25% Marcaine was injected into each respective transforaminal space.  The patient was observed for 90 seconds post injection.  After no sensory deficits were reported, and normal lower extremity motor function was noted,   the above injectate was administered so that equal amounts of the injectate were placed at each foramen (level) into the transforaminal epidural space.   Additional Comments:  The patient tolerated the procedure well Dressing: 2 x 2 sterile gauze and Band-Aid    Post-procedure details: Patient was observed during the procedure. Post-procedure instructions were reviewed.  Patient left the clinic in stable condition.

## 2020-09-19 NOTE — Progress Notes (Signed)
Pt state lower back pain that travels down her left leg. Pt state standing for a long time makes the pain worse. Pt state she takes pain meds to help ease her pain. Pt has hx of inj on 02/12/20 pt state it helped.   Numeric Pain Rating Scale and Functional Assessment Average Pain 7   In the last MONTH (on 0-10 scale) has pain interfered with the following?  1. General activity like being  able to carry out your everyday physical activities such as walking, climbing stairs, carrying groceries, or moving a chair?  Rating(9)   +Driver, -BT, -Dye Allergies.

## 2020-09-19 NOTE — Progress Notes (Signed)
[name] [name] - 70 y.o. female MRN [id number]  Date of birth: [birth year]  Office Visit Note: Visit Date: 09/19/2020 PCP: Harvest Forest, MD Referred by: Harvest Forest, MD  Subjective: No chief complaint on file.  HPI:  [name] [name] is a 70 y.o. female who comes in today at the request of Dr. Doneen Poisson for planned L3 Lumbar Transforaminal epidural steroid injection with fluoroscopic guidance.  The patient has failed conservative care including home exercise, medications, time and activity modification.  This injection will be diagnostic and hopefully therapeutic.  Please see requesting physician notes for further details and justification. MRI reviewed with images and spine model.  MRI reviewed in the note below.  ROS Otherwise per HPI.  Assessment & Plan: Visit Diagnoses:    ICD-10-CM   1. Spinal stenosis of lumbar region with neurogenic claudication  M48.062 XR C-ARM NO REPORT    Epidural Steroid injection    methylPREDNISolone acetate (DEPO-MEDROL) injection 80 mg    2. Lumbar radiculopathy  M54.16 XR C-ARM NO REPORT    Epidural Steroid injection    methylPREDNISolone acetate (DEPO-MEDROL) injection 80 mg      Plan: No additional findings.   Meds & Orders:  Meds ordered this encounter  Medications   methylPREDNISolone acetate (DEPO-MEDROL) injection 80 mg    Orders Placed This Encounter  Procedures   XR C-ARM NO REPORT   Epidural Steroid injection    Follow-up: Return if symptoms worsen or fail to improve.   Procedures: No procedures performed  Lumbosacral Transforaminal Epidural Steroid Injection - Sub-Pedicular Approach with Fluoroscopic Guidance  Patient: [name] [name]      Date of Birth: Jun 27, 1950 MRN: [id number] PCP: Harvest Forest, MD      Visit Date: 09/19/2020   Universal Protocol:    Date/Time: 09/19/2020  Consent Given By: the patient  Position: PRONE  Additional Comments: Vital signs were monitored before and after  the procedure. Patient was prepped and draped in the usual sterile fashion. The correct patient, procedure, and site was verified.   Injection Procedure Details:   Procedure diagnoses: Spinal stenosis of lumbar region with neurogenic claudication [M48.062]    Meds Administered:  Meds ordered this encounter  Medications   methylPREDNISolone acetate (DEPO-MEDROL) injection 80 mg    Laterality: Left  Location/Site:  L3-L4  Needle:5.0 in., 22 ga.  Short bevel or Quincke spinal needle  Needle Placement: Transforaminal  Findings:    -Comments: Excellent flow of contrast along the nerve, nerve root and into the epidural space.  Procedure Details: After squaring off the end-plates to get a true AP view, the C-arm was positioned so that an oblique view of the foramen as noted above was visualized. The target area is just inferior to the "nose of the scotty dog" or sub pedicular. The soft tissues overlying this structure were infiltrated with 2-3 ml. of 1% Lidocaine without Epinephrine.  The spinal needle was inserted toward the target using a "trajectory" view along the fluoroscope beam.  Under AP and lateral visualization, the needle was advanced so it did not puncture dura and was located close the 6 O'Clock position of the pedical in AP tracterory. Biplanar projections were used to confirm position. Aspiration was confirmed to be negative for CSF and/or blood. A 1-2 ml. volume of Isovue-250 was injected and flow of contrast was noted at each level. Radiographs were obtained for documentation purposes.   After attaining the desired flow of contrast documented above, a 0.5 to  1.0 ml test dose of 0.25% Marcaine was injected into each respective transforaminal space.  The patient was observed for 90 seconds post injection.  After no sensory deficits were reported, and normal lower extremity motor function was noted,   the above injectate was administered so that equal amounts of the injectate  were placed at each foramen (level) into the transforaminal epidural space.   Additional Comments:  The patient tolerated the procedure well Dressing: 2 x 2 sterile gauze and Band-Aid    Post-procedure details: Patient was observed during the procedure. Post-procedure instructions were reviewed.  Patient left the clinic in stable condition.   Clinical History: MRI LUMBAR SPINE WITHOUT AND WITH CONTRAST   TECHNIQUE: Multiplanar and multiecho pulse sequences of the lumbar spine were obtained without and with intravenous contrast.   CONTRAST:  91mL MULTIHANCE GADOBENATE DIMEGLUMINE 529 MG/ML IV SOLN   COMPARISON:  Lumbar MRI 08/10/2009   FINDINGS: Segmentation:  Normal   Alignment: Mild retrolisthesis L1-2 and L2-3. 5 mm anterolisthesis L4-5 has progressed.   Vertebrae: Negative for fracture or mass. Fatty endplate changes at L5-S1 due to disc degeneration similar to the prior study.   Conus medullaris and cauda equina: Conus extends to the L1-2 level. Conus and cauda equina appear normal.   Paraspinal and other soft tissues: No retroperitoneal mass or adenopathy   Disc levels:   T12-L1: Small right paracentral disc protrusion without significant stenosis. Mild facet degeneration.   L1-2: Mild retrolisthesis. Disc bulging and facet degeneration without significant stenosis   L2-3: Progressive disc degeneration and retrolisthesis. Progressive facet degeneration. Moderate foraminal encroachment bilaterally left greater than right has progressed with potential for impingement of the L2 nerve roots especially on the left. Mild spinal stenosis has progressed.   L3-4: Diffuse disc bulging and facet hypertrophy has progressed. Severe spinal stenosis has progressed. Moderate right foraminal encroachment has progressed   L4-5: Progressive anterolisthesis. Severe facet degeneration has progressed. Moderate spinal stenosis similar to the prior study   L5-S1: Remote  laminectomy on the right with epidural scarring around the right S1 nerve root and thecal sac unchanged. No recurrent disc protrusion or stenosis.   IMPRESSION: Mild spinal stenosis L2-3 has progressed. Progressive disc and facet degeneration. Moderate foraminal encroachment left greater than right has progressed in the interval   Severe spinal stenosis L3-4 has progressed. Moderate right foraminal encroachment also has progressed   Progressive anterolisthesis at L4-5 with moderate spinal stenosis similar to the prior study   Remote laminectomy right L5-S1 with epidural scar on the right. No recurrent disc protrusion or stenosis.     Electronically Signed   By: Marlan Palau M.D.   On: 08/21/2017 13:06     Objective:  VS:  HT:    WT:   BMI:     BP:   HR: bpm  TEMP: ( )  RESP:  Physical Exam Vitals and nursing note reviewed.  Constitutional:      General: She is not in acute distress.    Appearance: Normal appearance. She is not ill-appearing.  HENT:     Head: Normocephalic and atraumatic.     Right Ear: External ear normal.     Left Ear: External ear normal.  Eyes:     Extraocular Movements: Extraocular movements intact.  Cardiovascular:     Rate and Rhythm: Normal rate.     Pulses: Normal pulses.  Pulmonary:     Effort: Pulmonary effort is normal. No respiratory distress.  Abdominal:     General:  There is no distension.     Palpations: Abdomen is soft.  Musculoskeletal:        General: Tenderness present.     Cervical back: Neck supple.     Right lower leg: No edema.     Left lower leg: No edema.     Comments: Patient has good distal strength with no pain over the greater trochanters.  No clonus or focal weakness.  Skin:    Findings: No erythema, lesion or rash.  Neurological:     General: No focal deficit present.     Mental Status: She is alert and oriented to person, place, and time.     Sensory: No sensory deficit.     Motor: No weakness or  abnormal muscle tone.     Coordination: Coordination normal.  Psychiatric:        Mood and Affect: Mood normal.        Behavior: Behavior normal.     Imaging: No results found.

## 2020-11-18 ENCOUNTER — Ambulatory Visit: Payer: 59 | Admitting: Orthopedic Surgery

## 2020-11-19 ENCOUNTER — Other Ambulatory Visit: Payer: Self-pay

## 2020-11-19 ENCOUNTER — Ambulatory Visit (INDEPENDENT_AMBULATORY_CARE_PROVIDER_SITE_OTHER): Payer: 59 | Admitting: Orthopaedic Surgery

## 2020-11-19 ENCOUNTER — Encounter: Payer: Self-pay | Admitting: Orthopaedic Surgery

## 2020-11-19 VITALS — Ht 67.5 in | Wt 210.2 lb

## 2020-11-19 DIAGNOSIS — M1612 Unilateral primary osteoarthritis, left hip: Secondary | ICD-10-CM | POA: Diagnosis not present

## 2020-11-19 NOTE — Progress Notes (Addendum)
HPI: [name] [name] is a pleasant 70 year old female comes in today with left hip pain.  She is well-known to [name] service with a history of right total hip arthroplasty August 2020.  She states that her right hip is doing well.  She is having increasing left hip pain.  She underwent an intra-articular injection of the left hip by Dr. Alvester Morin on 05/15/2020.  She states this only gave her some relief for 2 weeks and it was less than 25%.  She has been taking ibuprofen and using Biofreeze which helps some.  She wants to discuss undergoing a left total hip arthroplasty in the near future.  She does not use a cane or assistive device.  Radiographs of her left hip show end-stage arthritis left hip with significant joint space loss.  She has cam like type impingement of the right hip.  She notes that she feels her leg lengths are equal.   Physical exam:  Height 5 foot 7.5 inches weight 210.2 pounds BMI 32.42  General well-developed well-nourished female no acute distress. Psych: Alert and oriented x3. Left hip she has limited external and internal rotation with internal rotation causing significant pain in the hip.  Review of systems: Positive for left hip pain otherwise negative.  Impression: Left hip end-stage arthritis  Plan: Per her request we will begin the scheduling process for her total hip arthroplasty.  She like to have this done in late September of this year if possible.  Risk benefits of surgery were discussed with patient at length.  She understands risk benefits of surgery as she is undergoing quite total hip implants.  Questions were encouraged and answered at length by [name] [name] and myself.  She will follow-up with [place] 2 weeks postop.

## 2020-11-25 ENCOUNTER — Ambulatory Visit: Payer: 59 | Admitting: Orthopedic Surgery

## 2020-12-05 ENCOUNTER — Other Ambulatory Visit: Payer: Self-pay

## 2020-12-27 ENCOUNTER — Other Ambulatory Visit: Payer: Self-pay | Admitting: Physician Assistant

## 2020-12-27 NOTE — Progress Notes (Signed)
Sent message, via epic in basket, requesting orders in epic from surgeon.  

## 2021-01-02 ENCOUNTER — Other Ambulatory Visit: Payer: Self-pay | Admitting: Physician Assistant

## 2021-01-02 NOTE — Patient Instructions (Signed)
DUE TO COVID-19 ONLY ONE VISITOR IS ALLOWED TO COME WITH YOU AND STAY IN THE WAITING ROOM ONLY DURING PRE OP AND PROCEDURE.   **NO VISITORS ARE ALLOWED IN THE SHORT STAY AREA OR RECOVERY ROOM!!**  IF YOU WILL BE ADMITTED INTO THE HOSPITAL YOU ARE ALLOWED ONLY TWO SUPPORT PEOPLE DURING VISITATION HOURS ONLY (10AM -8PM)   The support person(s) may change daily. The support person(s) must pass our screening, gel in and out, and wear a mask at all times, including in the patient's room. Patients must also wear a mask when staff or their support person are in the room.  No visitors under the age of 27. Any visitor under the age of 74 must be accompanied by an adult.    COVID SWAB TESTING MUST BE COMPLETED ON:  01/08/21 **MUST PRESENT COMPLETED FORM AT TESTING SITE**    [address] [place] (backside of the building) You are not required to quarantine, however you are required to wear a well-fitted mask when you are out and around people not in your household.  Hand Hygiene often Do NOT share personal items Notify your provider if you are in close contact with someone who has COVID or you develop fever 100.4 or greater, new onset of sneezing, cough, sore throat, shortness of breath or body aches.  [facility] Entrance [address], [address], must go inside of the hospital, NOT A DRIVE THRU!  (Must self quarantine after testing. Follow instructions on handout.)       Your procedure is scheduled on: 01/10/21   Report to [facility] Main Entrance    Report to admitting at: 9:30 AM   Call this number if you have problems the morning of surgery [phone]   Do not eat food :After Midnight.   May have liquids until: 9:15 AM    day of surgery  CLEAR LIQUID DIET  Foods Allowed                                                                     Foods Excluded  Water, Black Coffee and tea, regular and decaf                              liquids that you cannot  Plain Jell-O in any flavor  (No red)                                           see through such as: Fruit ices (not with fruit pulp)                                     milk, soups, orange juice              Iced Popsicles (No red)  All solid food                                   Apple juices Sports drinks like Gatorade (No red) Lightly seasoned clear broth or consume(fat free) Sugar, honey syrup  Sample Menu Breakfast                                Lunch                                     Supper Cranberry juice                    Beef broth                            Chicken broth Jell-O                                     Grape juice                           Apple juice Coffee or tea                        Jell-O                                      Popsicle                                                Coffee or tea                        Coffee or tea      Complete one Gatorade drink the morning of surgery at : 9:15 AM      the day of surgery.     The day of surgery:  Drink ONE (1) Pre-Surgery Clear Ensure or G2 by am the morning of surgery. Drink in one sitting. Do not sip.  This drink was given to you during your hospital  pre-op appointment visit. Nothing else to drink after completing the  Pre-Surgery Clear Ensure or G2.          If you have questions, please contact your surgeon's office.   Oral Hygiene is also important to reduce your risk of infection.                                    Remember - BRUSH YOUR TEETH THE MORNING OF SURGERY WITH YOUR REGULAR TOOTHPASTE   Do NOT smoke after Midnight   Take these medicines the morning of surgery with A SIP OF WATER: amlodipine,synthroid.  How to Manage Your Diabetes Before and After Surgery  Why is it important to control my blood sugar before and after surgery? Improving blood sugar levels  before and after surgery helps healing and can limit  problems. A way of improving blood sugar control is eating a healthy diet by:  Eating less sugar and carbohydrates  Increasing activity/exercise  Talking with your doctor about reaching your blood sugar goals High blood sugars (greater than 180 mg/dL) can raise your risk of infections and slow your recovery, so you will need to focus on controlling your diabetes during the weeks before surgery. Make sure that the doctor who takes care of your diabetes knows about your planned surgery including the date and location.  How do I manage my blood sugar before surgery? Check your blood sugar at least 4 times a day, starting 2 days before surgery, to make sure that the level is not too high or low. Check your blood sugar the morning of your surgery when you wake up and every 2 hours until you get to the Short Stay unit. If your blood sugar is less than 70 mg/dL, you will need to treat for low blood sugar: Do not take insulin. Treat a low blood sugar (less than 70 mg/dL) with  cup of clear juice (cranberry or apple), 4 glucose tablets, OR glucose gel. Recheck blood sugar in 15 minutes after treatment (to make sure it is greater than 70 mg/dL). If your blood sugar is not greater than 70 mg/dL on recheck, call [phone] for further instructions. Report your blood sugar to the short stay nurse when you get to Short Stay.  If you are admitted to the hospital after surgery: Your blood sugar will be checked by the staff and you will probably be given insulin after surgery (instead of oral diabetes medicines) to make sure you have good blood sugar levels. The goal for blood sugar control after surgery is 80-180 mg/dL.   WHAT DO I DO ABOUT MY DIABETES MEDICATION?  Do not take oral diabetes medicines (pills) the morning of surgery.  THE Day BEFORE SURGERY, take amaryl as usual in the AM.       THE MORNING OF SURGERY, DO NOT take amaryl.  DO NOT TAKE ANY ORAL DIABETIC MEDICATIONS DAY OF YOUR  SURGERY                              You may not have any metal on your body including hair pins, jewelry, and body piercing             Do not wear make-up, lotions, powders, perfumes/cologne, or deodorant  Do not wear nail polish including gel and S&S, artificial/acrylic nails, or any other type of covering on natural nails including finger and toenails. If you have artificial nails, gel coating, etc. that needs to be removed by a nail salon please have this removed prior to surgery or surgery may need to be canceled/ delayed if the surgeon/ anesthesia feels like they are unable to be safely monitored.   Do not shave  48 hours prior to surgery.    Do not bring valuables to the hospital. [facility] IS NOT             RESPONSIBLE   FOR VALUABLES.   Contacts, dentures or bridgework may not be worn into surgery.   Bring small overnight bag day of surgery.    Patients discharged on the day of surgery will not be allowed to drive home.   Special Instructions: Bring a copy of your healthcare power of attorney and living will  documents         the day of surgery if you haven't scanned them before.              Please read over the following fact sheets you were given: IF YOU HAVE QUESTIONS ABOUT YOUR PRE-OP INSTRUCTIONS PLEASE CALL [phone]   [facility] - Preparing for Surgery Before surgery, you can play an important role.  Because skin is not sterile, your skin needs to be as free of germs as possible.  You can reduce the number of germs on your skin by washing with CHG (chlorahexidine gluconate) soap before surgery.  CHG is an antiseptic cleaner which kills germs and bonds with the skin to continue killing germs even after washing. Please DO NOT use if you have an allergy to CHG or antibacterial soaps.  If your skin becomes reddened/irritated stop using the CHG and inform your nurse when you arrive at Short Stay. Do not shave (including legs and underarms) for at least 48 hours prior to  the first CHG shower.  You may shave your face/neck. Please follow these instructions carefully:  1.  Shower with CHG Soap the night before surgery and the  morning of Surgery.  2.  If you choose to wash your hair, wash your hair first as usual with your  normal  shampoo.  3.  After you shampoo, rinse your hair and body thoroughly to remove the  shampoo.                           4.  Use CHG as you would any other liquid soap.  You can apply chg directly  to the skin and wash                       Gently with a scrungie or clean washcloth.  5.  Apply the CHG Soap to your body ONLY FROM THE NECK DOWN.   Do not use on face/ open                           Wound or open sores. Avoid contact with eyes, ears mouth and genitals (private parts).                       Wash face,  Genitals (private parts) with your normal soap.             6.  Wash thoroughly, paying special attention to the area where your surgery  will be performed.  7.  Thoroughly rinse your body with warm water from the neck down.  8.  DO NOT shower/wash with your normal soap after using and rinsing off  the CHG Soap.                9.  Pat yourself dry with a clean towel.            10.  Wear clean pajamas.            11.  Place clean sheets on your bed the night of your first shower and do not  sleep with pets. Day of Surgery : Do not apply any lotions/deodorants the morning of surgery.  Please wear clean clothes to the hospital/surgery center.  FAILURE TO FOLLOW THESE INSTRUCTIONS MAY RESULT IN THE CANCELLATION OF YOUR SURGERY PATIENT SIGNATURE_________________________________  NURSE SIGNATURE__________________________________  ________________________________________________________________________   Incentive  Spirometer  An incentive spirometer is a tool that can help keep your lungs clear and active. This tool measures how well you are filling your lungs with each breath. Taking long deep breaths may help reverse or  decrease the chance of developing breathing (pulmonary) problems (especially infection) following: A long period of time when you are unable to move or be active. BEFORE THE PROCEDURE  If the spirometer includes an indicator to show your best effort, your nurse or respiratory therapist will set it to a desired goal. If possible, sit up straight or lean slightly forward. Try not to slouch. Hold the incentive spirometer in an upright position. INSTRUCTIONS FOR USE  Sit on the edge of your bed if possible, or sit up as far as you can in bed or on a chair. Hold the incentive spirometer in an upright position. Breathe out normally. Place the mouthpiece in your mouth and seal your lips tightly around it. Breathe in slowly and as deeply as possible, raising the piston or the ball toward the top of the column. Hold your breath for 3-5 seconds or for as long as possible. Allow the piston or ball to fall to the bottom of the column. Remove the mouthpiece from your mouth and breathe out normally. Rest for a few seconds and repeat Steps 1 through 7 at least 10 times every 1-2 hours when you are awake. Take your time and take a few normal breaths between deep breaths. The spirometer may include an indicator to show your best effort. Use the indicator as a goal to work toward during each repetition. After each set of 10 deep breaths, practice coughing to be sure your lungs are clear. If you have an incision (the cut made at the time of surgery), support your incision when coughing by placing a pillow or rolled up towels firmly against it. Once you are able to get out of bed, walk around indoors and cough well. You may stop using the incentive spirometer when instructed by your caregiver.  RISKS AND COMPLICATIONS Take your time so you do not get dizzy or light-headed. If you are in pain, you may need to take or ask for pain medication before doing incentive spirometry. It is harder to take a deep breath if you  are having pain. AFTER USE Rest and breathe slowly and easily. It can be helpful to keep track of a log of your progress. Your caregiver can provide you with a simple table to help with this. If you are using the spirometer at home, follow these instructions: SEEK MEDICAL CARE IF:  You are having difficultly using the spirometer. You have trouble using the spirometer as often as instructed. Your pain medication is not giving enough relief while using the spirometer. You develop fever of 100.5 F (38.1 C) or higher. SEEK IMMEDIATE MEDICAL CARE IF:  You cough up bloody sputum that had not been present before. You develop fever of 102 F (38.9 C) or greater. You develop worsening pain at or near the incision site. MAKE SURE YOU:  Understand these instructions. Will watch your condition. Will get help right away if you are not doing well or get worse. Document Released: 08/03/2006 Document Revised: 06/15/2011 Document Reviewed: 10/04/2006 [facility] Patient Information 2014 [place], [place].   ________________________________________________________________________

## 2021-01-02 NOTE — Progress Notes (Signed)
Can you please clarify the order for the surgical consent.Thanks

## 2021-01-03 ENCOUNTER — Other Ambulatory Visit: Payer: Self-pay

## 2021-01-03 ENCOUNTER — Encounter (HOSPITAL_COMMUNITY)
Admission: RE | Admit: 2021-01-03 | Discharge: 2021-01-03 | Disposition: A | Discharge status: Home / Self Care | Payer: 59 | Source: Ambulatory Visit | Attending: Orthopaedic Surgery | Admitting: Orthopaedic Surgery

## 2021-01-03 ENCOUNTER — Encounter (HOSPITAL_COMMUNITY): Payer: Self-pay

## 2021-01-03 DIAGNOSIS — Z01818 Encounter for other preprocedural examination: Secondary | ICD-10-CM | POA: Insufficient documentation

## 2021-01-03 LAB — BASIC METABOLIC PANEL
Anion gap: 8 (ref 5–15)
BUN: 14 mg/dL (ref 8–23)
CO2: 27 mmol/L (ref 22–32)
Calcium: 9 mg/dL (ref 8.9–10.3)
Chloride: 107 mmol/L (ref 98–111)
Creatinine, Ser: 0.88 mg/dL (ref 0.44–1.00)
GFR, Estimated: >60 mL/min (ref >60)
Glucose, Bld: 113 mg/dL — ABNORMAL HIGH (ref 70–99)
Potassium: 3.2 mmol/L — ABNORMAL LOW (ref 3.5–5.1)
Sodium: 142 mmol/L (ref 135–145)

## 2021-01-03 LAB — CBC
HCT: 34.8 % — ABNORMAL LOW (ref 36.0–46.0)
Hemoglobin: 11 g/dL — ABNORMAL LOW (ref 12.0–15.0)
MCH: 28 pg (ref 26.0–34.0)
MCHC: 31.6 g/dL (ref 30.0–36.0)
MCV: 88.5 fL (ref 80.0–100.0)
Platelets: 392 10*3/uL (ref 150–400)
RBC: 3.93 MIL/uL (ref 3.87–5.11)
RDW: 13.7 % (ref 11.5–15.5)
WBC: 9.2 10*3/uL (ref 4.0–10.5)
nRBC: 0 % (ref 0.0–0.2)

## 2021-01-03 LAB — HEMOGLOBIN A1C
Hgb A1c MFr Bld: 6.6 % — ABNORMAL HIGH (ref 4.8–5.6)
Mean Plasma Glucose: 142.72 mg/dL

## 2021-01-03 LAB — GLUCOSE, CAPILLARY: Glucose-Capillary: 123 mg/dL — ABNORMAL HIGH (ref 70–99)

## 2021-01-03 LAB — SURGICAL PCR SCREEN
MRSA, PCR: NEGATIVE
Staphylococcus aureus: NEGATIVE

## 2021-01-03 NOTE — Progress Notes (Signed)
COVID Vaccine Completed: Yes Date COVID Vaccine completed: 02/27/20. X 3 COVID vaccine manufacturer: Moderna     COVID Test: 01/08/21  PCP - Dr. Jamison Oka. Cardiologist -   Chest x-ray -  EKG -  Stress Test -  ECHO -  Cardiac Cath -  Pacemaker/ICD device last checked:  Sleep Study -  CPAP -   Fasting Blood Sugar - N/A Checks Blood Sugar ___0__ times a day  Blood Thinner Instructions: Aspirin Instructions: Last Dose:  Anesthesia review:   Patient denies shortness of breath, fever, cough and chest pain at PAT appointment   Patient verbalized understanding of instructions that were given to them at the PAT appointment. Patient was also instructed that they will need to review over the PAT instructions again at home before surgery.

## 2021-01-08 ENCOUNTER — Other Ambulatory Visit: Payer: Self-pay | Admitting: Orthopaedic Surgery

## 2021-01-08 LAB — SARS CORONAVIRUS 2 (TAT 6-24 HRS): SARS Coronavirus 2: NEGATIVE

## 2021-01-09 NOTE — H&P (Signed)
TOTAL HIP ADMISSION H&P  Patient is admitted for left total hip arthroplasty.  Subjective:  Chief Complaint: left hip pain  HPI: [name] [name], 70 y.o. female, has a history of pain and functional disability in the left hip(s) due to arthritis and patient has failed non-surgical conservative treatments for greater than 12 weeks to include NSAID's and/or analgesics, corticosteriod injections, flexibility and strengthening excercises, use of assistive devices, weight reduction as appropriate, and activity modification.  Onset of symptoms was gradual starting 2 years ago with gradually worsening course since that time.The patient noted no past surgery on the left hip(s).  Patient currently rates pain in the left hip at 10 out of 10 with activity. Patient has night pain, worsening of pain with activity and weight bearing, trendelenberg gait, pain that interfers with activities of daily living, and pain with passive range of motion. Patient has evidence of subchondral cysts, subchondral sclerosis, periarticular osteophytes, and joint space narrowing by imaging studies. This condition presents safety issues increasing the risk of falls.  There is no current active infection.  Patient Active Problem List   Diagnosis Date Noted   Vitamin D deficiency 06/27/2020   Peripheral sensory neuropathy due to type 2 diabetes mellitus (HCC) 12/01/2019   Mild intermittent asthma 12/01/2019   Myositis 12/01/2019   Unilateral primary osteoarthritis, left hip 03/06/2019   Chronic low back pain 01/18/2019   Status post total replacement of right hip 11/18/2018   Unilateral primary osteoarthritis, right hip 09/29/2018   Bilateral primary osteoarthritis of hip 05/25/2018   Spinal stenosis of lumbar region with neurogenic claudication 04/20/2018   Impingement syndrome of right shoulder 04/20/2018   Type II diabetes mellitus, uncontrolled 11/05/2017   Mixed hyperlipidemia 06/04/2017   Acquired hypothyroidism  03/05/2017   Normocytic anemia 01/24/2017   Bilateral carpal tunnel syndrome 05/06/2016   Essential hypertension, benign 02/20/2016   Primary osteoarthritis of left knee 05/24/2014   Elevated blood pressure reading 09/29/2011   Chronic back pain 09/29/2011   Leg edema 09/29/2011   Obesity with body mass index 30 or greater 09/29/2011   Past Medical History:  Diagnosis Date   Allergy    Anemia    Arthritis    Asthma    as child only   Back pain    Diabetes mellitus without complication (HCC)    Hypertension    Hypothyroidism     Past Surgical History:  Procedure Laterality Date   ABDOMINAL HYSTERECTOMY  1989   fibroid-no longer needs pap   BACK SURGERY  1987, 1985   DDD   CARPAL TUNNEL RELEASE Left 05/28/2016   Procedure: CARPAL TUNNEL RELEASE;  Surgeon: Cindee Salt, MD;  Location: [facility];  Service: Orthopedics;  Laterality: Left;   CARPAL TUNNEL RELEASE Right 03/17/2018   Procedure: CARPAL TUNNEL RELEASE;  Surgeon: Cindee Salt, MD;  Location: [facility];  Service: Orthopedics;  Laterality: Right;   CARPAL TUNNEL RELEASE Left    TOTAL HIP ARTHROPLASTY Right 11/18/2018   Procedure: RIGHT TOTAL HIP ARTHROPLASTY ANTERIOR APPROACH;  Surgeon: Kathryne Hitch, MD;  Location: [facility];  Service: Orthopedics;  Laterality: Right;   TUBAL LIGATION      No current facility-administered medications for this encounter.   Current Outpatient Medications  Medication Sig Dispense Refill Last Dose   amLODipine (NORVASC) 10 MG tablet Take 10 mg by mouth daily.      diphenhydrAMINE (BENADRYL) 25 MG tablet Take 0.5 tablets (12.5 mg total) by mouth at bedtime as needed (  cramping). 30 tablet 0    glimepiride (AMARYL) 1 MG tablet Take 0.5 mg by mouth daily with breakfast.      icosapent Ethyl (VASCEPA) 1 g capsule Take 2 g by mouth 2 (two) times daily.      ketoconazole (NIZORAL) 2 % cream Apply 1 application topically 2 (two) times daily.      levothyroxine  (SYNTHROID) 75 MCG tablet Take 75 mcg by mouth daily before breakfast.      Multiple Vitamins-Minerals (CERTAVITE SENIOR/ANTIOXIDANT) TABS Take 1 tablet by mouth daily.      nabumetone (RELAFEN) 500 MG tablet Take 500 mg by mouth 2 (two) times daily as needed for pain.      pregabalin (LYRICA) 50 MG capsule Take 50 mg by mouth at bedtime.      rosuvastatin (CRESTOR) 10 MG tablet Take 10 mg by mouth daily.      celecoxib (CELEBREX) 200 MG capsule Take 1 capsule (200 mg total) by mouth daily. (Patient not taking: No sig reported) 30 capsule 3 Not Taking   Allergies  Allergen Reactions   Gabapentin Nausea And Vomiting   Celecoxib     Other reaction(s): Wheezing Other reaction(s): Wheezing (ALLERGY/intolerance)   Metformin Diarrhea   Flagyl [Metronidazole] Rash    Social History   Tobacco Use   Smoking status: Never   Smokeless tobacco: Never  Substance Use Topics   Alcohol use: Yes    Comment: social    Family History  Problem Relation Age of Onset   Asthma Mother    Cancer Mother        AML   Leukemia Mother    Heart disease Father    Hypertension Father    Kidney disease Father    Heart attack Brother    Breast cancer Maternal Aunt    Multiple sclerosis Maternal Aunt      Review of Systems  All other systems reviewed and are negative.  Objective:  Physical Exam Vitals reviewed.  Constitutional:      Appearance: Normal appearance.  HENT:     Head: Normocephalic and atraumatic.  Eyes:     Extraocular Movements: Extraocular movements intact.     Pupils: Pupils are equal, round, and reactive to light.  Cardiovascular:     Rate and Rhythm: Normal rate and regular rhythm.  Pulmonary:     Effort: Pulmonary effort is normal.     Breath sounds: Normal breath sounds.  Musculoskeletal:     Cervical back: Normal range of motion and neck supple.     Left hip: Tenderness and bony tenderness present. Decreased range of motion. Decreased strength.  Neurological:      Mental Status: She is alert and oriented to person, place, and time.  Psychiatric:        Behavior: Behavior normal.    Vital signs in last 24 hours:    Labs:   Estimated body mass index is 32.08 kg/m as calculated from the following:   Height as of 01/03/21: 5\' 8"  (1.727 m).   Weight as of 01/03/21: 95.7 kg.   Imaging Review Plain radiographs demonstrate severe degenerative joint disease of the left hip(s). The bone quality appears to be excellent for age and reported activity level.      Assessment/Plan:  End stage arthritis, left hip(s)  The patient history, physical examination, clinical judgement of the provider and imaging studies are consistent with end stage degenerative joint disease of the left hip(s) and total hip arthroplasty is deemed medically necessary. The  treatment options including medical management, injection therapy, arthroscopy and arthroplasty were discussed at length. The risks and benefits of total hip arthroplasty were presented and reviewed. The risks due to aseptic loosening, infection, stiffness, dislocation/subluxation,  thromboembolic complications and other imponderables were discussed.  The patient acknowledged the explanation, agreed to proceed with the plan and consent was signed. Patient is being admitted for inpatient treatment for surgery, pain control, PT, OT, prophylactic antibiotics, VTE prophylaxis, progressive ambulation and ADL's and discharge planning.The patient is planning to be discharged home with home health services

## 2021-01-10 ENCOUNTER — Observation Stay (HOSPITAL_COMMUNITY): Payer: 59

## 2021-01-10 ENCOUNTER — Encounter (HOSPITAL_COMMUNITY)
Admission: RE | Disposition: A | Discharge status: Home / Self Care | Payer: Self-pay | Source: Ambulatory Visit | Attending: Orthopaedic Surgery

## 2021-01-10 ENCOUNTER — Ambulatory Visit (HOSPITAL_COMMUNITY): Payer: 59

## 2021-01-10 ENCOUNTER — Ambulatory Visit (HOSPITAL_COMMUNITY): Payer: 59 | Admitting: Anesthesiology

## 2021-01-10 ENCOUNTER — Observation Stay (HOSPITAL_COMMUNITY)
Admission: RE | Admit: 2021-01-10 | Discharge: 2021-01-11 | Disposition: A | Discharge status: Home / Self Care | Payer: 59 | Source: Ambulatory Visit | Attending: Orthopaedic Surgery | Admitting: Orthopaedic Surgery

## 2021-01-10 ENCOUNTER — Encounter (HOSPITAL_COMMUNITY): Payer: Self-pay | Admitting: Orthopaedic Surgery

## 2021-01-10 ENCOUNTER — Other Ambulatory Visit: Payer: Self-pay

## 2021-01-10 DIAGNOSIS — J45909 Unspecified asthma, uncomplicated: Secondary | ICD-10-CM | POA: Insufficient documentation

## 2021-01-10 DIAGNOSIS — M1612 Unilateral primary osteoarthritis, left hip: Secondary | ICD-10-CM | POA: Diagnosis present

## 2021-01-10 DIAGNOSIS — I10 Essential (primary) hypertension: Secondary | ICD-10-CM | POA: Insufficient documentation

## 2021-01-10 DIAGNOSIS — Z79899 Other long term (current) drug therapy: Secondary | ICD-10-CM | POA: Insufficient documentation

## 2021-01-10 DIAGNOSIS — E119 Type 2 diabetes mellitus without complications: Secondary | ICD-10-CM | POA: Diagnosis not present

## 2021-01-10 DIAGNOSIS — E039 Hypothyroidism, unspecified: Secondary | ICD-10-CM | POA: Diagnosis not present

## 2021-01-10 DIAGNOSIS — Z96642 Presence of left artificial hip joint: Secondary | ICD-10-CM

## 2021-01-10 DIAGNOSIS — Z419 Encounter for procedure for purposes other than remedying health state, unspecified: Secondary | ICD-10-CM

## 2021-01-10 HISTORY — PX: TOTAL HIP ARTHROPLASTY: SHX124

## 2021-01-10 LAB — GLUCOSE, CAPILLARY
Glucose-Capillary: 109 mg/dL — ABNORMAL HIGH (ref 70–99)
Glucose-Capillary: 98 mg/dL (ref 70–99)

## 2021-01-10 LAB — TYPE AND SCREEN
ABO/RH(D): A POS
Antibody Screen: NEGATIVE

## 2021-01-10 LAB — ABO/RH: ABO/RH(D): A POS

## 2021-01-10 SURGERY — ARTHROPLASTY, HIP, TOTAL, ANTERIOR APPROACH
Anesthesia: Spinal | Site: Hip | Laterality: Left

## 2021-01-10 MED ORDER — ALUM & MAG HYDROXIDE-SIMETH 200-200-20 MG/5ML PO SUSP: 30.0000 mL | ORAL | Status: DC | PRN

## 2021-01-10 MED ORDER — PROPOFOL 500 MG/50ML IV EMUL
INTRAVENOUS | Status: DC | PRN
  Administered 2021-01-10: 75 ug/kg/min via INTRAVENOUS

## 2021-01-10 MED ORDER — POLYETHYLENE GLYCOL 3350 17 G PO PACK: 17.0000 g | PACK | Freq: Every day | ORAL | Status: DC | PRN

## 2021-01-10 MED ORDER — SODIUM CHLORIDE 0.9 % IV SOLN: INTRAVENOUS | Status: DC

## 2021-01-10 MED ORDER — METOCLOPRAMIDE HCL 5 MG PO TABS
5.0000 mg | ORAL_TABLET | Freq: Three times a day (TID) | ORAL | Status: DC | PRN
Start: 2021-01-10 — End: 2021-01-11

## 2021-01-10 MED ORDER — LEVOTHYROXINE SODIUM 75 MCG PO TABS
75.0000 ug | ORAL_TABLET | Freq: Every day | ORAL | Status: DC
  Administered 2021-01-11: 75 ug via ORAL
  Filled 2021-01-10: qty 1

## 2021-01-10 MED ORDER — ASPIRIN 81 MG PO CHEW
81.0000 mg | CHEWABLE_TABLET | Freq: Two times a day (BID) | ORAL | Status: DC
  Administered 2021-01-10 – 2021-01-11 (×2): 81 mg via ORAL
  Filled 2021-01-10 (×2): qty 1

## 2021-01-10 MED ORDER — TRANEXAMIC ACID-NACL 1000-0.7 MG/100ML-% IV SOLN
INTRAVENOUS | Status: AC
  Filled 2021-01-10: qty 100

## 2021-01-10 MED ORDER — ICOSAPENT ETHYL 1 G PO CAPS
2.0000 g | ORAL_CAPSULE | Freq: Two times a day (BID) | ORAL | Status: DC
  Administered 2021-01-11: 2 g via ORAL
  Filled 2021-01-10 (×2): qty 2

## 2021-01-10 MED ORDER — CEFAZOLIN SODIUM-DEXTROSE 2-4 GM/100ML-% IV SOLN
INTRAVENOUS | Status: AC
  Filled 2021-01-10: qty 100

## 2021-01-10 MED ORDER — ORAL CARE MOUTH RINSE: 15.0000 mL | Freq: Once | OROMUCOSAL | Status: AC

## 2021-01-10 MED ORDER — HYDROMORPHONE HCL 1 MG/ML IJ SOLN: 0.2500 mg | INTRAMUSCULAR | Status: DC | PRN

## 2021-01-10 MED ORDER — ONDANSETRON HCL 4 MG PO TABS: 4.0000 mg | ORAL_TABLET | Freq: Four times a day (QID) | ORAL | Status: DC | PRN

## 2021-01-10 MED ORDER — ROSUVASTATIN CALCIUM 10 MG PO TABS
10.0000 mg | ORAL_TABLET | Freq: Every day | ORAL | Status: DC
  Administered 2021-01-11: 10 mg via ORAL
  Filled 2021-01-10: qty 1

## 2021-01-10 MED ORDER — ACETAMINOPHEN 500 MG PO TABS: 1000.0000 mg | ORAL_TABLET | Freq: Once | ORAL | Status: DC

## 2021-01-10 MED ORDER — DEXAMETHASONE SODIUM PHOSPHATE 10 MG/ML IJ SOLN
INTRAMUSCULAR | Status: AC
  Filled 2021-01-10: qty 1

## 2021-01-10 MED ORDER — CEFAZOLIN SODIUM-DEXTROSE 1-4 GM/50ML-% IV SOLN
1.0000 g | Freq: Four times a day (QID) | INTRAVENOUS | Status: AC
  Administered 2021-01-10 – 2021-01-11 (×2): 1 g via INTRAVENOUS
  Filled 2021-01-10 (×2): qty 50

## 2021-01-10 MED ORDER — MIDAZOLAM HCL 5 MG/5ML IJ SOLN
INTRAMUSCULAR | Status: DC | PRN
  Administered 2021-01-10: 2 mg via INTRAVENOUS

## 2021-01-10 MED ORDER — PROPOFOL 500 MG/50ML IV EMUL
INTRAVENOUS | Status: AC
  Filled 2021-01-10: qty 50

## 2021-01-10 MED ORDER — HYDROMORPHONE HCL 1 MG/ML IJ SOLN: 0.5000 mg | INTRAMUSCULAR | Status: DC | PRN

## 2021-01-10 MED ORDER — MIDAZOLAM HCL 2 MG/2ML IJ SOLN
INTRAMUSCULAR | Status: AC
  Filled 2021-01-10: qty 2

## 2021-01-10 MED ORDER — OXYCODONE HCL 5 MG PO TABS
5.0000 mg | ORAL_TABLET | ORAL | Status: DC | PRN
  Administered 2021-01-10: 5 mg via ORAL
  Administered 2021-01-11: 10 mg via ORAL
  Filled 2021-01-10: qty 1

## 2021-01-10 MED ORDER — DEXAMETHASONE SODIUM PHOSPHATE 10 MG/ML IJ SOLN
INTRAMUSCULAR | Status: DC | PRN
  Administered 2021-01-10: 8 mg via INTRAVENOUS

## 2021-01-10 MED ORDER — FENTANYL CITRATE (PF) 100 MCG/2ML IJ SOLN
INTRAMUSCULAR | Status: DC | PRN
  Administered 2021-01-10: 50 ug via INTRAVENOUS

## 2021-01-10 MED ORDER — ACETAMINOPHEN 10 MG/ML IV SOLN
INTRAVENOUS | Status: DC | PRN
  Administered 2021-01-10: 1000 mg via INTRAVENOUS

## 2021-01-10 MED ORDER — ACETAMINOPHEN 10 MG/ML IV SOLN
INTRAVENOUS | Status: AC
  Filled 2021-01-10: qty 100

## 2021-01-10 MED ORDER — PROPOFOL 10 MG/ML IV BOLUS
INTRAVENOUS | Status: AC
  Filled 2021-01-10: qty 20

## 2021-01-10 MED ORDER — MENTHOL 3 MG MT LOZG: 1.0000 | LOZENGE | OROMUCOSAL | Status: DC | PRN

## 2021-01-10 MED ORDER — METHOCARBAMOL 500 MG PO TABS
500.0000 mg | ORAL_TABLET | Freq: Four times a day (QID) | ORAL | Status: DC | PRN
  Administered 2021-01-10 – 2021-01-11 (×3): 500 mg via ORAL
  Filled 2021-01-10 (×3): qty 1

## 2021-01-10 MED ORDER — PHENYLEPHRINE 40 MCG/ML (10ML) SYRINGE FOR IV PUSH (FOR BLOOD PRESSURE SUPPORT)
PREFILLED_SYRINGE | INTRAVENOUS | Status: DC | PRN
  Administered 2021-01-10 (×4): 40 ug via INTRAVENOUS

## 2021-01-10 MED ORDER — LACTATED RINGERS IV SOLN: INTRAVENOUS | Status: DC

## 2021-01-10 MED ORDER — DOCUSATE SODIUM 100 MG PO CAPS
100.0000 mg | ORAL_CAPSULE | Freq: Two times a day (BID) | ORAL | Status: DC
  Administered 2021-01-10 – 2021-01-11 (×2): 100 mg via ORAL
  Filled 2021-01-10 (×2): qty 1

## 2021-01-10 MED ORDER — ONDANSETRON HCL 4 MG/2ML IJ SOLN: 4.0000 mg | Freq: Four times a day (QID) | INTRAMUSCULAR | Status: DC | PRN

## 2021-01-10 MED ORDER — PROPOFOL 10 MG/ML IV BOLUS
INTRAVENOUS | Status: DC | PRN
  Administered 2021-01-10: 20 mg via INTRAVENOUS

## 2021-01-10 MED ORDER — ACETAMINOPHEN 325 MG PO TABS: 325.0000 mg | ORAL_TABLET | Freq: Four times a day (QID) | ORAL | Status: DC | PRN

## 2021-01-10 MED ORDER — CEFAZOLIN SODIUM-DEXTROSE 2-4 GM/100ML-% IV SOLN
2.0000 g | INTRAVENOUS | Status: AC
  Administered 2021-01-10: 2 g via INTRAVENOUS

## 2021-01-10 MED ORDER — OXYCODONE HCL 5 MG PO TABS
10.0000 mg | ORAL_TABLET | ORAL | Status: DC | PRN
  Administered 2021-01-10 – 2021-01-11 (×2): 10 mg via ORAL
  Filled 2021-01-10 (×3): qty 2

## 2021-01-10 MED ORDER — 0.9 % SODIUM CHLORIDE (POUR BTL) OPTIME
TOPICAL | Status: DC | PRN
  Administered 2021-01-10: 1000 mL

## 2021-01-10 MED ORDER — PREGABALIN 50 MG PO CAPS
50.0000 mg | ORAL_CAPSULE | Freq: Every day | ORAL | Status: DC
  Administered 2021-01-10: 50 mg via ORAL
  Filled 2021-01-10: qty 1

## 2021-01-10 MED ORDER — MEPERIDINE HCL 50 MG/ML IJ SOLN: 6.2500 mg | INTRAMUSCULAR | Status: DC | PRN

## 2021-01-10 MED ORDER — CHLORHEXIDINE GLUCONATE 0.12 % MT SOLN
15.0000 mL | Freq: Once | OROMUCOSAL | Status: AC
  Administered 2021-01-10: 15 mL via OROMUCOSAL

## 2021-01-10 MED ORDER — AMLODIPINE BESYLATE 10 MG PO TABS
10.0000 mg | ORAL_TABLET | Freq: Every day | ORAL | Status: DC
  Administered 2021-01-11: 10 mg via ORAL
  Filled 2021-01-10: qty 1

## 2021-01-10 MED ORDER — GLIMEPIRIDE 1 MG PO TABS
0.5000 mg | ORAL_TABLET | Freq: Every day | ORAL | Status: DC
  Administered 2021-01-11: 0.5 mg via ORAL
  Filled 2021-01-10: qty 0.5

## 2021-01-10 MED ORDER — BUPIVACAINE IN DEXTROSE 0.75-8.25 % IT SOLN
INTRATHECAL | Status: DC | PRN
  Administered 2021-01-10: 1.8 mL via INTRATHECAL

## 2021-01-10 MED ORDER — PANTOPRAZOLE SODIUM 40 MG PO TBEC
40.0000 mg | DELAYED_RELEASE_TABLET | Freq: Every day | ORAL | Status: DC
  Administered 2021-01-11: 40 mg via ORAL
  Filled 2021-01-10: qty 1

## 2021-01-10 MED ORDER — PHENOL 1.4 % MT LIQD: 1.0000 | OROMUCOSAL | Status: DC | PRN

## 2021-01-10 MED ORDER — FENTANYL CITRATE (PF) 100 MCG/2ML IJ SOLN
INTRAMUSCULAR | Status: AC
  Filled 2021-01-10: qty 2

## 2021-01-10 MED ORDER — METHOCARBAMOL 500 MG IVPB - SIMPLE MED
500.0000 mg | Freq: Four times a day (QID) | INTRAVENOUS | Status: DC | PRN
  Filled 2021-01-10: qty 50

## 2021-01-10 MED ORDER — POVIDONE-IODINE 10 % EX SWAB
2.0000 "application " | Freq: Once | CUTANEOUS | Status: AC
  Administered 2021-01-10: 2 via TOPICAL

## 2021-01-10 MED ORDER — PHENYLEPHRINE 40 MCG/ML (10ML) SYRINGE FOR IV PUSH (FOR BLOOD PRESSURE SUPPORT)
PREFILLED_SYRINGE | INTRAVENOUS | Status: AC
  Filled 2021-01-10: qty 10

## 2021-01-10 MED ORDER — METOCLOPRAMIDE HCL 5 MG/ML IJ SOLN: 5.0000 mg | Freq: Three times a day (TID) | INTRAMUSCULAR | Status: DC | PRN

## 2021-01-10 MED ORDER — TRANEXAMIC ACID-NACL 1000-0.7 MG/100ML-% IV SOLN
1000.0000 mg | INTRAVENOUS | Status: AC
  Administered 2021-01-10: 1000 mg via INTRAVENOUS

## 2021-01-10 MED ORDER — PROMETHAZINE HCL 25 MG/ML IJ SOLN: 6.2500 mg | INTRAMUSCULAR | Status: DC | PRN

## 2021-01-10 MED ORDER — SODIUM CHLORIDE 0.9 % IR SOLN
Status: DC | PRN
  Administered 2021-01-10: 1000 mL

## 2021-01-10 SURGICAL SUPPLY — 39 items
ARTICULEZE HEAD (Hips) ×2 IMPLANT
BAG COUNTER SPONGE SURGICOUNT (BAG) ×2 IMPLANT
BAG ZIPLOCK 12X15 (MISCELLANEOUS) IMPLANT
BENZOIN TINCTURE PRP APPL 2/3 (GAUZE/BANDAGES/DRESSINGS) IMPLANT
BLADE SAW SGTL 18X1.27X75 (BLADE) ×2 IMPLANT
COVER PERINEAL POST (MISCELLANEOUS) ×2 IMPLANT
COVER SURGICAL LIGHT HANDLE (MISCELLANEOUS) ×2 IMPLANT
DRAPE FOOT SWITCH (DRAPES) ×2 IMPLANT
DRAPE STERI IOBAN 125X83 (DRAPES) ×2 IMPLANT
DRAPE U-SHAPE 47X51 STRL (DRAPES) ×4 IMPLANT
DRSG AQUACEL AG ADV 3.5X10 (GAUZE/BANDAGES/DRESSINGS) ×2 IMPLANT
DURAPREP 26ML APPLICATOR (WOUND CARE) ×2 IMPLANT
ELECT REM PT RETURN 15FT ADLT (MISCELLANEOUS) ×2 IMPLANT
GAUZE XEROFORM 1X8 LF (GAUZE/BANDAGES/DRESSINGS) ×2 IMPLANT
GLOVE SRG 8 PF TXTR STRL LF DI (GLOVE) ×2 IMPLANT
GLOVE SURG ENC MOIS LTX SZ7.5 (GLOVE) ×2 IMPLANT
GLOVE SURG NEOPR MICRO LF SZ8 (GLOVE) ×2 IMPLANT
GLOVE SURG UNDER POLY LF SZ8 (GLOVE) ×4
GOWN STRL REUS W/TWL XL LVL3 (GOWN DISPOSABLE) ×4 IMPLANT
HANDPIECE INTERPULSE COAX TIP (DISPOSABLE) ×2
HEAD ARTICULEZE (Hips) ×1 IMPLANT
HOLDER FOLEY CATH W/STRAP (MISCELLANEOUS) ×2 IMPLANT
KIT TURNOVER KIT A (KITS) ×2 IMPLANT
LINER ACETAB NEUTRAL 36ID 520D (Liner) ×2 IMPLANT
PACK ANTERIOR HIP CUSTOM (KITS) ×2 IMPLANT
PENCIL SMOKE EVACUATOR (MISCELLANEOUS) IMPLANT
PIN SECTOR W/GRIP ACE CUP 52MM (Hips) ×2 IMPLANT
SET HNDPC FAN SPRY TIP SCT (DISPOSABLE) ×1 IMPLANT
STAPLER VISISTAT 35W (STAPLE) IMPLANT
STEM FEM ACTIS HIGH SZ3 (Stem) ×2 IMPLANT
STRIP CLOSURE SKIN 1/2X4 (GAUZE/BANDAGES/DRESSINGS) IMPLANT
SUT ETHIBOND NAB CT1 #1 30IN (SUTURE) ×2 IMPLANT
SUT ETHILON 2 0 PS N (SUTURE) IMPLANT
SUT MNCRL AB 4-0 PS2 18 (SUTURE) IMPLANT
SUT VIC AB 0 CT1 36 (SUTURE) ×2 IMPLANT
SUT VIC AB 1 CT1 36 (SUTURE) ×2 IMPLANT
SUT VIC AB 2-0 CT1 27 (SUTURE) ×4
SUT VIC AB 2-0 CT1 TAPERPNT 27 (SUTURE) ×2 IMPLANT
TRAY FOLEY MTR SLVR 16FR STAT (SET/KITS/TRAYS/PACK) IMPLANT

## 2021-01-10 NOTE — Brief Op Note (Signed)
01/10/2021  1:59 PM  PATIENT:  [name] [name]  70 y.o. female  PRE-OPERATIVE DIAGNOSIS:  endstage osteoarthritis left hip  POST-OPERATIVE DIAGNOSIS:  endstage osteoarthritis left hip  PROCEDURE:  Procedure(s): LEFT TOTAL HIP ARTHROPLASTY ANTERIOR APPROACH (Left)  SURGEON:  Surgeon(s) and Role:    Kathryne Hitch, MD - Primary  PHYSICIAN ASSISTANT: Rexene Edison, Pa-c  ANESTHESIA:   spinal  EBL:  200 mL   DICTATION: .Other Dictation: Dictation Number [id number]  PLAN OF CARE: Admit for overnight observation  PATIENT DISPOSITION:  PACU - hemodynamically stable.   Delay start of Pharmacological VTE agent (>24hrs) due to surgical blood loss or risk of bleeding: no

## 2021-01-10 NOTE — Interval H&P Note (Signed)
History and Physical Interval Note: The patient understands fully that she is here today for a left hip replacement to treat her left hip osteoarthritis.  There has been no acute or interval change in her medical status.  Please see recent H&P.  The risks and benefits of surgery been explained in detail and informed consent is obtained.  The left operative hip has been marked.  01/10/2021 11:39 AM  Stepahnie A Shadowens  has presented today for surgery, with the diagnosis of endstage osteoarthritis left hip.  The various methods of treatment have been discussed with the patient and family. After consideration of risks, benefits and other options for treatment, the patient has consented to  Procedure(s): LEFT TOTAL HIP ARTHROPLASTY ANTERIOR APPROACH (Left) as a surgical intervention.  The patient's history has been reviewed, patient examined, no change in status, stable for surgery.  I have reviewed the patient's chart and labs.  Questions were answered to the patient's satisfaction.     Kathryne Hitch

## 2021-01-10 NOTE — Evaluation (Signed)
Physical Therapy Evaluation Patient Details Name: [name] [name] MRN: [id number] DOB: 09/21/1950 Today's Date: 01/10/2021  History of Present Illness  Patient is 70 y.o. female s/p Lt THA anterior approach on 01/10/21 with PMH significant for OA, asthma, back pain , DM, HTN, hypothyroidism, back surgery, Rt THA in 2020.    Clinical Impression  [name] [name] is a 70 y.o. female POD 0 s/p Lt THA. Patient reports independence with mobility at baseline. Patient is now limited by functional impairments (see PT problem list below) and requires min assist for transfers and gait with RW. Patient was able to ambulate ~100 feet with RW and min assist. Patient instructed in exercise to facilitate circulation to manage edema and reduce risk of DVT. Patient will benefit from continued skilled PT interventions to address impairments and progress towards PLOF. Acute PT will follow to progress mobility and stair training in preparation for safe discharge home.        Recommendations for follow up therapy are one component of a multi-disciplinary discharge planning process, led by the attending physician.  Recommendations may be updated based on patient status, additional functional criteria and insurance authorization.  Follow Up Recommendations Follow surgeon's recommendation for DC plan and follow-up therapies    Equipment Recommendations  None recommended by PT    Recommendations for Other Services       Precautions / Restrictions Precautions Precautions: Fall Restrictions Weight Bearing Restrictions: No LLE Weight Bearing: Weight bearing as tolerated Other Position/Activity Restrictions: WBAT      Mobility  Bed Mobility Overal bed mobility: Needs Assistance Bed Mobility: Supine to Sit     Supine to sit: Min assist;HOB elevated     General bed mobility comments: cues to use bed rail and light assist for Lt LE due to discomfort but pt able to initaite.    Transfers Overall transfer  level: Needs assistance Equipment used: Rolling walker (2 wheeled) Transfers: Sit to/from Stand Sit to Stand: Min assist         General transfer comment: cues for hand placement with RW, assist to complete rise. pt steady once standing.  Ambulation/Gait Ambulation/Gait assistance: Min assist;Min guard Gait Distance (Feet): 100 Feet Assistive device: Rolling walker (2 wheeled) Gait Pattern/deviations: Step-to pattern;Step-through pattern;Decreased stride length;Decreased weight shift to left Gait velocity: decr   General Gait Details: cues for step to pattern and proximity to RW. pt progressed to step through with no overt LOB and min guard for safety.  Stairs            Wheelchair Mobility    Modified Rankin (Stroke Patients Only)       Balance Overall balance assessment: Needs assistance Sitting-balance support: Feet supported Sitting balance-Leahy Scale: Good     Standing balance support: During functional activity;Bilateral upper extremity supported Standing balance-Leahy Scale: Poor                               Pertinent Vitals/Pain Pain Assessment: 0-10    Home Living Family/patient expects to be discharged to:: Private residence Living Arrangements: Other relatives Available Help at Discharge: Family Type of Home: House Home Access: Stairs to enter Entrance Stairs-Rails: Can reach both Entrance Stairs-Number of Steps: 3 Home Layout: One level Home Equipment: Walker - 2 wheels;Bedside commode      Prior Function Level of Independence: Independent               Hand Dominance   Dominant  Hand: Right    Extremity/Trunk Assessment   Upper Extremity Assessment Upper Extremity Assessment: Overall WFL for tasks assessed         Cervical / Trunk Assessment Cervical / Trunk Assessment: Normal  Communication   Communication: No difficulties  Cognition Arousal/Alertness: Awake/alert Behavior During Therapy: WFL for tasks  assessed/performed Overall Cognitive Status: Within Functional Limits for tasks assessed                                        General Comments      Exercises Total Joint Exercises Ankle Circles/Pumps: AROM;Both;20 reps;Seated   Assessment/Plan    PT Assessment Patient needs continued PT services  PT Problem List Decreased strength;Decreased range of motion;Decreased activity tolerance;Decreased mobility;Decreased knowledge of use of DME;Decreased knowledge of precautions;Pain;Decreased balance       PT Treatment Interventions DME instruction;Gait training;Stair training;Functional mobility training;Therapeutic activities;Therapeutic exercise;Balance training;Patient/family education    PT Goals (Current goals can be found in the Care Plan section)  Acute Rehab PT Goals Patient Stated Goal: get home and back to independence to do everything PT Goal Formulation: With patient Time For Goal Achievement: 01/17/21 Potential to Achieve Goals: Good    Frequency 7X/week   Barriers to discharge        Co-evaluation               AM-PAC PT "6 Clicks" Mobility  Outcome Measure Help needed turning from your back to your side while in a flat bed without using bedrails?: A Little Help needed moving from lying on your back to sitting on the side of a flat bed without using bedrails?: A Little Help needed moving to and from a bed to a chair (including a wheelchair)?: A Little Help needed standing up from a chair using your arms (e.g., wheelchair or bedside chair)?: A Little Help needed to walk in hospital room?: A Little Help needed climbing 3-5 steps with a railing? : A Little 6 Click Score: 18    End of Session Equipment Utilized During Treatment: Gait belt Activity Tolerance: Patient tolerated treatment well Patient left: in chair;with call bell/phone within reach;with chair alarm set Nurse Communication: Mobility status PT Visit Diagnosis: Other  abnormalities of gait and mobility (R26.89);Muscle weakness (generalized) (M62.81);Difficulty in walking, not elsewhere classified (R26.2)    Time: 3419-6222 PT Time Calculation (min) (ACUTE ONLY): 21 min   Charges:   PT Evaluation $PT Eval Low Complexity: 1 Low          Wynn Maudlin, DPT Acute Rehabilitation Services Office [phone] Pager [phone]   Anitra Lauth 01/10/2021, 5:58 PM

## 2021-01-10 NOTE — Anesthesia Preprocedure Evaluation (Addendum)
Anesthesia Evaluation  Patient identified by MRN, date of birth, ID band Patient awake    Reviewed: Allergy & Precautions, H&P , NPO status , Patient's Chart, lab work & pertinent test results  Airway Mallampati: II   Neck ROM: full    Dental  (+) Dental Advisory Given, Partial Lower, Partial Upper   Pulmonary asthma ,    breath sounds clear to auscultation       Cardiovascular hypertension,  Rhythm:regular Rate:Normal     Neuro/Psych    GI/Hepatic   Endo/Other  diabetes, Type 2Hypothyroidism   Renal/GU      Musculoskeletal  (+) Arthritis ,   Abdominal   Peds  Hematology  (+) anemia ,   Anesthesia Other Findings   Reproductive/Obstetrics                           Anesthesia Physical  Anesthesia Plan  ASA: 2  Anesthesia Plan: Spinal   Post-op Pain Management:    Induction: Intravenous  PONV Risk Score and Plan: 2 and Ondansetron, Dexamethasone, Propofol infusion, Midazolam and Treatment may vary due to age or medical condition  Airway Management Planned: Simple Face Mask  Additional Equipment: None  Intra-op Plan:   Post-operative Plan:   Informed Consent: I have reviewed the patients History and Physical, chart, labs and discussed the procedure including the risks, benefits and alternatives for the proposed anesthesia with the patient or authorized representative who has indicated his/her understanding and acceptance.     Dental advisory given  Plan Discussed with: CRNA  Anesthesia Plan Comments:         Anesthesia Quick Evaluation

## 2021-01-10 NOTE — Transfer of Care (Signed)
Immediate Anesthesia Transfer of Care Note  Patient: [name] [name]  Procedure(s) Performed: LEFT TOTAL HIP ARTHROPLASTY ANTERIOR APPROACH (Left: Hip)  Patient Location: PACU  Anesthesia Type:MAC and Spinal  Level of Consciousness: drowsy  Airway & Oxygen Therapy: Patient Spontanous Breathing and Patient connected to face mask oxygen  Post-op Assessment: Report given to RN and Post -op Vital signs reviewed and stable  Post vital signs: Reviewed and stable  Last Vitals:  Vitals Value Taken Time  BP 107/69 01/10/21 1415  Temp    Pulse 99 01/10/21 1415  Resp 23 01/10/21 1415  SpO2 92 % 01/10/21 1415  Vitals shown include unvalidated device data.  Last Pain:  Vitals:   01/10/21 1029  TempSrc: Oral         Complications: No notable events documented.

## 2021-01-10 NOTE — Anesthesia Postprocedure Evaluation (Signed)
Anesthesia Post Note  Patient: [name] [name]  Procedure(s) Performed: LEFT TOTAL HIP ARTHROPLASTY ANTERIOR APPROACH (Left: Hip)     Patient location during evaluation: PACU Anesthesia Type: Spinal Level of consciousness: oriented and awake and alert Pain management: pain level controlled Vital Signs Assessment: post-procedure vital signs reviewed and stable Respiratory status: spontaneous breathing, respiratory function stable and patient connected to nasal cannula oxygen Cardiovascular status: blood pressure returned to baseline and stable Postop Assessment: no headache, no backache and no apparent nausea or vomiting Anesthetic complications: no   No notable events documented.  Last Vitals:  Vitals:   01/10/21 1615 01/10/21 1642  BP: 131/73 125/69  Pulse: 74 75  Resp: (!) 27 16  Temp:  36.7 C  SpO2: 94% 95%    Last Pain:  Vitals:   01/10/21 1718  TempSrc:   PainSc: 3                  Shelton Silvas

## 2021-01-10 NOTE — Anesthesia Procedure Notes (Signed)
Spinal  Patient location during procedure: OR Start time: 01/10/2021 12:40 PM End time: 01/10/2021 12:46 PM Reason for block: surgical anesthesia Staffing Performed: resident/CRNA  Anesthesiologist: Shelton Silvas, MD Resident/CRNA: Marny Lowenstein, CRNA Preanesthetic Checklist Completed: patient identified, IV checked, site marked, risks and benefits discussed, surgical consent, monitors and equipment checked, pre-op evaluation and timeout performed Spinal Block Patient position: sitting Prep: DuraPrep Patient monitoring: heart rate, continuous pulse ox and blood pressure Approach: midline Location: L3-4 Injection technique: single-shot Needle Needle type: Pencan  Needle gauge: 24 G Needle length: 10 cm Assessment Sensory level: T6 Events: CSF return

## 2021-01-11 DIAGNOSIS — M1612 Unilateral primary osteoarthritis, left hip: Secondary | ICD-10-CM | POA: Diagnosis not present

## 2021-01-11 LAB — CBC
HCT: 30 % — ABNORMAL LOW (ref 36.0–46.0)
Hemoglobin: 9.5 g/dL — ABNORMAL LOW (ref 12.0–15.0)
MCH: 27.5 pg (ref 26.0–34.0)
MCHC: 31.7 g/dL (ref 30.0–36.0)
MCV: 86.7 fL (ref 80.0–100.0)
Platelets: 363 10*3/uL (ref 150–400)
RBC: 3.46 MIL/uL — ABNORMAL LOW (ref 3.87–5.11)
RDW: 13.8 % (ref 11.5–15.5)
WBC: 16.7 10*3/uL — ABNORMAL HIGH (ref 4.0–10.5)
nRBC: 0 % (ref 0.0–0.2)

## 2021-01-11 LAB — BASIC METABOLIC PANEL
Anion gap: 10 (ref 5–15)
BUN: 16 mg/dL (ref 8–23)
CO2: 24 mmol/L (ref 22–32)
Calcium: 8.6 mg/dL — ABNORMAL LOW (ref 8.9–10.3)
Chloride: 105 mmol/L (ref 98–111)
Creatinine, Ser: 0.98 mg/dL (ref 0.44–1.00)
GFR, Estimated: >60 mL/min (ref >60)
Glucose, Bld: 150 mg/dL — ABNORMAL HIGH (ref 70–99)
Potassium: 4 mmol/L (ref 3.5–5.1)
Sodium: 139 mmol/L (ref 135–145)

## 2021-01-11 MED ORDER — OXYCODONE HCL 5 MG PO TABS: 5.0000 mg | ORAL_TABLET | Freq: Four times a day (QID) | ORAL | 0 refills | Status: DC | PRN

## 2021-01-11 MED ORDER — METHOCARBAMOL 500 MG PO TABS: 500.0000 mg | ORAL_TABLET | Freq: Four times a day (QID) | ORAL | 1 refills | Status: DC | PRN

## 2021-01-11 MED ORDER — ASPIRIN 81 MG PO CHEW: 81.0000 mg | CHEWABLE_TABLET | Freq: Two times a day (BID) | ORAL | 0 refills | Status: DC

## 2021-01-11 NOTE — Plan of Care (Signed)
  Problem: Pain Management: Goal: Pain level will decrease with appropriate interventions Outcome: Progressing   Problem: Clinical Measurements: Goal: Respiratory complications will improve Outcome: Progressing   Problem: Activity: Goal: Risk for activity intolerance will decrease Outcome: Progressing   Problem: Pain Managment: Goal: General experience of comfort will improve Outcome: Progressing   Problem: Safety: Goal: Ability to remain free from injury will improve Outcome: Progressing

## 2021-01-11 NOTE — Op Note (Signed)
NAME: [name] [name], [name] MEDICAL RECORD NO: [id number] ACCOUNT NO: [id number] DATE OF BIRTH: 02-Aug-1950 FACILITY: [facility]ATION: [id number] PHYSICIAN: Vanita Panda. Magnus Ivan, MD  Operative Report   DATE OF PROCEDURE: 01/10/2021  PREOPERATIVE DIAGNOSIS:  Primary osteoarthritis and degenerative joint disease, left hip.  POSTOPERATIVE DIAGNOSIS:  Primary osteoarthritis and degenerative joint disease, left hip.  PROCEDURE:  Left total hip arthroplasty through direct anterior approach.  IMPLANTS:  DePuy sector Gription acetabular component size 52, size 36+0 neutral polyethylene liner, size 3 ACTIS femoral component with high offset, size 36+5 metal hip ball.  SURGEON:  Doneen Poisson, M.D.  ASSISTANT:  Rexene Edison, PA-C.  ANESTHESIA:  Spinal.  ANTIBIOTICS:  2 g IV Ancef.  ESTIMATED BLOOD LOSS:  200 mL.  COMPLICATIONS:  None.  INDICATIONS:  The patient is a 70 year old female well known to me.  She has debilitating arthritis involving her left hip.  I have actually replaced her right hip through a direct anterior approach a year or two ago.  She has debilitating end-stage  arthritis of her left hip, verified by clinical exam and x-rays.  At this point, she does wish to proceed with total hip arthroplasty on the left side given the success with the right side as well as given her continued pain with the left hip and the  detrimental effect this is having on her quality of life, her mobility and activities of daily living.  Having had surgery before, she was fully aware of the risk of acute blood loss anemia, nerve or vessel injury, fracture, infection, dislocation, DVT,  implant failure and skin and soft tissue issues as well as leg length differences.  She understands our goals are to decrease pain, improve mobility and overall improve quality of life.  DESCRIPTION OF PROCEDURE:  After informed consent was obtained, appropriate left hip was marked.  She was brought to the operating room  and sat up on a stretcher.  Spinal anesthesia was obtained.  She was laid in supine position on the stretcher.  A  Foley catheter was placed.  Both feet had traction boots applied to them.  Of note, she is shorter on her left operative side and her x-rays look like she is longer, she is actually shorter clinically, so we will correlate that with intraoperative  findings and how we will address her radiographically with our lengthening of her hip.  She was placed supine on the Hana fracture table, with the perineal post in place and both legs in line with the skeletal traction device and no traction applied.  Her left operative hip was prepped and draped in DuraPrep and sterile drapes.  A timeout  was called.  She was identified as the correct patient, correct left hip.  I then made an incision just inferior and posterior to the anterior superior iliac spine and carried this obliquely down the leg.  We dissected down tensor fascia lata muscle and  the tensor fascia was then divided longitudinally to lead to proceed with a direct anterior approach to the hip.  We identified and cauterized circumflex vessels and then identified the hip capsule, opened up the hip capsule in an L-type format finding a  moderate joint effusion.  We placed our curved retractors in the medial and lateral femoral neck within the capsule, made our femoral neck cut with an oscillating saw just proximal to the lesser trochanter and completed this with an osteotome.  We  placed a corkscrew guide in the femoral head and removed the femoral head  in its entirety and found a wide area devoid of cartilage.  I then removed the remnants of the acetabular labrum and other debris and placed a bent Hohmann over the medial  acetabular rim.  We then began reaming under direct visualization from a size 43 reamer in a stepwise increments going up to a size 51 reamer. With all reamers placed under direct visualization and the last reamer was placed  under direct fluoroscopy, so  we could obtain our depth of reaming, our inclination and anteversion.  I then placed a real DePuy sector Gription acetabular component size 52 and with a 36+0 polyethylene liner, based on the acetabular positioning on her other side.  Attention was then  turned to the femur with the leg externally rotated to 120 degrees and adducted, we were able to place a Mueller retractor medially and Hohman retractor behind the greater trochanter and released lateral joint capsule and used a box cutting osteotome to  enter the femoral canal and a rongeur to lateralize and then began broaching using the ACTIS broaching system from a size 0, went up to size 3. With a size 3 in place, we trialed a high offset femoral neck and a 36+5 metal hip ball, reduced this in  acetabulum, based on exam, assessing this clinically and radiographically, we felt like with a high offset stem was appropriate.  We needed just a little more leg length.  We dislocated the hip, removed the trial components.  We then placed the real  Actis femoral component with high offset size 3 and we then went in with a 36+5 metal hip ball.  Again, reduced this in acetabulum and it was stable, assessing radiographically and mechanically.  We were pleased with leg length and offset as well as  range of motion and stability.  We then irrigated the soft tissue with normal saline solution using pulsatile lavage.  We closed the joint capsule remnants with #1 Ethibond suture followed by #1 Vicryl to close the tensor fascia.  0 Vicryl was used to  close the deep tissue and 2-0 Vicryl was used to close the subcutaneous tissue.  The skin was reapproximated with staples.  An Aquacel dressing was applied.  She was taken off the Hana table and taken to recovery room in stable condition with all final  counts being correct.  No complications noted.   Of note, Rexene Edison, PA-C did assist during the entirety of this case and his assistance  was crucial for facilitating every aspect of this case.   MUK D: 01/10/2021 1:58:03 pm T: 01/11/2021 12:03:00 am  JOB: [id number]/ [id number]

## 2021-01-11 NOTE — Plan of Care (Signed)
Pt stable at this time with no needs. Pt to d/c home with family. Pt dressing, clean, dry and intact.

## 2021-01-11 NOTE — Discharge Summary (Signed)
Patient ID: [name] [name] MRN: [id number] DOB/AGE: July 05, 1950 70 y.o.  Admit date: 01/10/2021 Discharge date: 01/11/2021  Admission Diagnoses:  Principal Problem:   Unilateral primary osteoarthritis, left hip Active Problems:   Status post left hip replacement   Discharge Diagnoses:  Same  Past Medical History:  Diagnosis Date   Allergy    Anemia    Arthritis    Asthma    as child only   Back pain    Diabetes mellitus without complication (HCC)    Hypertension    Hypothyroidism     Surgeries: Procedure(s): LEFT TOTAL HIP ARTHROPLASTY ANTERIOR APPROACH on 01/10/2021   Consultants:   Discharged Condition: Improved  Hospital Course: [name] [name] is an 70 y.o. female who was admitted 01/10/2021 for operative treatment ofUnilateral primary osteoarthritis, left hip. Patient has severe unremitting pain that affects sleep, daily activities, and work/hobbies. After pre-op clearance the patient was taken to the operating room on 01/10/2021 and underwent  Procedure(s): LEFT TOTAL HIP ARTHROPLASTY ANTERIOR APPROACH.    Patient was given perioperative antibiotics:  Anti-infectives (From admission, onward)    Start     Dose/Rate Route Frequency Ordered Stop   01/10/21 1830  ceFAZolin (ANCEF) IVPB 1 g/50 mL premix        1 g 100 mL/hr over 30 Minutes Intravenous Every 6 hours 01/10/21 1641 01/11/21 0047   01/10/21 1030  ceFAZolin (ANCEF) IVPB 2g/100 mL premix        2 g 200 mL/hr over 30 Minutes Intravenous On call to O.R. 01/10/21 1017 01/10/21 1316   01/10/21 1025  ceFAZolin (ANCEF) 2-4 GM/100ML-% IVPB       Note to Pharmacy: Vevelyn Royals  : cabinet override      01/10/21 1025 01/10/21 1320        Patient was given sequential compression devices, early ambulation, and chemoprophylaxis to prevent DVT.  Patient benefited maximally from hospital stay and there were no complications.    Recent vital signs: Patient Vitals for the past 24 hrs:  BP Temp Temp src Pulse  Resp SpO2 Height Weight  01/11/21 0910 116/70 98.4 F (36.9 C) Oral 81 18 99 % -- --  01/11/21 0514 112/73 98 F (36.7 C) Oral 77 16 96 % -- --  01/11/21 0121 112/66 98.1 F (36.7 C) Oral 82 16 96 % -- --  01/10/21 2107 131/72 98.7 F (37.1 C) Oral 94 16 98 % -- --  01/10/21 1718 -- -- -- -- -- -- 5' 7.99" (1.727 m) 95.7 kg  01/10/21 1642 125/69 98.1 F (36.7 C) Oral 75 16 95 % -- --  01/10/21 1615 131/73 -- -- 74 (!) 27 94 % -- --  01/10/21 1600 133/76 (!) 97.5 F (36.4 C) -- 74 19 93 % -- --  01/10/21 1545 132/78 -- -- 72 20 93 % -- --  01/10/21 1530 128/80 -- -- 73 (!) 22 93 % -- --  01/10/21 1515 129/75 -- -- 75 17 96 % -- --  01/10/21 1500 109/70 (!) 97.5 F (36.4 C) -- 74 (!) 22 96 % -- --  01/10/21 1445 123/75 -- -- 76 18 95 % -- --  01/10/21 1430 116/68 -- -- 97 (!) 24 95 % -- --  01/10/21 1415 107/69 (!) 97.5 F (36.4 C) -- 100 (!) 22 92 % -- --     Recent laboratory studies:  Recent Labs    01/11/21 0313  WBC 16.7*  HGB 9.5*  HCT 30.0*  PLT 363  NA 139  K 4.0  CL 105  CO2 24  BUN 16  CREATININE 0.98  GLUCOSE 150*  CALCIUM 8.6*     Discharge Medications:   Allergies as of 01/11/2021       Reactions   Gabapentin Nausea And Vomiting   Celecoxib    Other reaction(s): Wheezing Other reaction(s): Wheezing (ALLERGY/intolerance)   Metformin Diarrhea   Flagyl [metronidazole] Rash        Medication List     STOP taking these medications    celecoxib 200 MG capsule Commonly known as: CELEBREX       TAKE these medications    amLODipine 10 MG tablet Commonly known as: NORVASC Take 10 mg by mouth daily.   aspirin 81 MG chewable tablet Chew 1 tablet (81 mg total) by mouth 2 (two) times daily.   CertaVite Senior/Antioxidant Tabs Take 1 tablet by mouth daily.   diphenhydrAMINE 25 MG tablet Commonly known as: BENADRYL Take 0.5 tablets (12.5 mg total) by mouth at bedtime as needed (cramping).   glimepiride 1 MG tablet Commonly known as:  AMARYL Take 0.5 mg by mouth daily with breakfast.   icosapent Ethyl 1 g capsule Commonly known as: VASCEPA Take 2 g by mouth 2 (two) times daily.   ketoconazole 2 % cream Commonly known as: NIZORAL Apply 1 application topically 2 (two) times daily.   levothyroxine 75 MCG tablet Commonly known as: SYNTHROID Take 75 mcg by mouth daily before breakfast.   methocarbamol 500 MG tablet Commonly known as: ROBAXIN Take 1 tablet (500 mg total) by mouth every 6 (six) hours as needed for muscle spasms.   nabumetone 500 MG tablet Commonly known as: RELAFEN Take 500 mg by mouth 2 (two) times daily as needed for pain.   oxyCODONE 5 MG immediate release tablet Commonly known as: Oxy IR/ROXICODONE Take 1-2 tablets (5-10 mg total) by mouth every 6 (six) hours as needed for moderate pain (pain score 4-6).   pregabalin 50 MG capsule Commonly known as: LYRICA Take 50 mg by mouth at bedtime.   rosuvastatin 10 MG tablet Commonly known as: CRESTOR Take 10 mg by mouth daily.               Durable Medical Equipment  (From admission, onward)           Start     Ordered   01/10/21 1642  DME 3 n 1  Once        01/10/21 1641   01/10/21 1642  DME Walker rolling  Once       Question Answer Comment  Walker: With 5 Inch Wheels   Patient needs a walker to treat with the following condition Status post total left knee replacement      01/10/21 1641            Diagnostic Studies: DG Pelvis Portable  Result Date: 01/10/2021 CLINICAL DATA:  Left hip replacement EXAM: PORTABLE PELVIS 1-2 VIEWS COMPARISON:  05/15/2020 FINDINGS: Previous right hip replacement. Interval left hip replacement with intact hardware and normal alignment. Gas in the soft tissues consistent with recent surgery. No fracture IMPRESSION: Status post left hip replacement with expected postsurgical changes Electronically Signed   By: Jasmine Pang M.D.   On: 01/10/2021 15:49   DG C-Arm 1-60 Min-No Report  Result  Date: 01/10/2021 Fluoroscopy was utilized by the requesting physician.  No radiographic interpretation.   DG HIP OPERATIVE UNILAT W OR W/O PELVIS LEFT  Result Date: 01/10/2021 CLINICAL DATA:  Left hip replacement EXAM: OPERATIVE left HIP (WITH PELVIS IF PERFORMED) 5 VIEWS TECHNIQUE: Fluoroscopic spot image(s) were submitted for interpretation post-operatively. COMPARISON:  05/15/2020 FINDINGS: Five low resolution intraoperative spot views of the left hip. Total fluoroscopy time was 19 seconds. Previous right hip replacement. Images demonstrate left hip replacement with intact hardware and normal hardware IMPRESSION: Intraoperative fluoroscopic assistance provided during left hip replacement. Electronically Signed   By: Jasmine Pang M.D.   On: 01/10/2021 15:47    Disposition: Discharge disposition: 01-Home or Self Care          Follow-up Information     Kathryne Hitch, MD Follow up in 2 week(s).   Specialty: Orthopedic Surgery Contact information: [address] [place] [address] [phone]                  Signed: Kathryne Hitch 01/11/2021, 10:57 AM

## 2021-01-11 NOTE — Plan of Care (Signed)
  Problem: Education: Goal: Knowledge of the prescribed therapeutic regimen will improve Outcome: Progressing   Problem: Clinical Measurements: Goal: Postoperative complications will be avoided or minimized Outcome: Progressing   Problem: Pain Management: Goal: Pain level will decrease with appropriate interventions Outcome: Progressing   Problem: Safety: Goal: Ability to remain free from injury will improve Outcome: Progressing   

## 2021-01-11 NOTE — Progress Notes (Signed)
DME 3n1 ordered from [employer]asmine. Daleen Squibb, MSW, LCSW 10/8/20229:28 AM

## 2021-01-11 NOTE — Progress Notes (Signed)
Subjective: 1 Day Post-Op Procedure(s) (LRB): LEFT TOTAL HIP ARTHROPLASTY ANTERIOR APPROACH (Left) Patient reports pain as moderate.    Objective: Vital signs in last 24 hours: Temp:  [97.5 F (36.4 C)-98.7 F (37.1 C)] 98.4 F (36.9 C) (10/08 0910) Pulse Rate:  [72-100] 81 (10/08 0910) Resp:  [16-27] 18 (10/08 0910) BP: (107-133)/(66-80) 116/70 (10/08 0910) SpO2:  [92 %-99 %] 99 % (10/08 0910) Weight:  [95.7 kg] 95.7 kg (10/07 1718)  Intake/Output from previous day: 10/07 0701 - 10/08 0700 In: 2821.6 [P.O.:480; I.V.:1941.6; IV Piggyback:400] Out: 2050 [Urine:1850; Blood:200] Intake/Output this shift: Total I/O In: 613.2 [P.O.:480; I.V.:133.2] Out: 150 [Urine:150]  Recent Labs    01/11/21 0313  HGB 9.5*   Recent Labs    01/11/21 0313  WBC 16.7*  RBC 3.46*  HCT 30.0*  PLT 363   Recent Labs    01/11/21 0313  NA 139  K 4.0  CL 105  CO2 24  BUN 16  CREATININE 0.98  GLUCOSE 150*  CALCIUM 8.6*   No results for input(s): LABPT, INR in the last 72 hours.  Sensation intact distally Intact pulses distally Dorsiflexion/Plantar flexion intact Incision: scant drainage   Assessment/Plan: 1 Day Post-Op Procedure(s) (LRB): LEFT TOTAL HIP ARTHROPLASTY ANTERIOR APPROACH (Left) Up with therapy Discharge home with home health      Kathryne Hitch 01/11/2021, 10:54 AM

## 2021-01-11 NOTE — Discharge Instructions (Signed)

## 2021-01-11 NOTE — Progress Notes (Signed)
Physical Therapy Treatment Patient Details Name: [name] [name] MRN: [id number] DOB: 06/05/1950 Today's Date: 01/11/2021   History of Present Illness Patient is 70 y.o. female s/p Lt THA anterior approach on 01/10/21 with PMH significant for OA, asthma, back pain , DM, HTN, hypothyroidism, back surgery, Rt THA in 2020.    PT Comments    Pt continues to progress toward acute PT goals this session with progression to stair training and increased ambulation distance. Pt ambulated ~160t with MIN guard-supervision, no LOB observed. Pt performed safe stair negotiation with MIN guard for safety and cues for sequencing and safe hand placement. PT educated pt on safe guarding technique for family members at home. PT reviewed LE HEP, pt demonstrated understanding with no reports of increased pain or fatigue. Pt is currently at mobility level that is safe for d/c to home and will have assist from her niece and other family member at home. Pt will benefit from continued skilled PT to increase independence and maximize safety with mobility.     Recommendations for follow up therapy are one component of a multi-disciplinary discharge planning process, led by the attending physician.  Recommendations may be updated based on patient status, additional functional criteria and insurance authorization.  Follow Up Recommendations  Follow surgeon's recommendation for DC plan and follow-up therapies     Equipment Recommendations  None recommended by PT    Recommendations for Other Services       Precautions / Restrictions Precautions Precautions: Fall Restrictions Weight Bearing Restrictions: No Other Position/Activity Restrictions: WBAT     Mobility  Bed Mobility Overal bed mobility: Needs Assistance Bed Mobility: Supine to Sit     Supine to sit: Supervision     General bed mobility comments: HOB at 11deg to simulate flatter HOB home environment. Supervision for safety, pt able to progress B LEs  to EOB with slightly increased time.    Transfers Overall transfer level: Needs assistance Equipment used: Rolling walker (2 wheeled) Transfers: Sit to/from Stand Sit to Stand: Min guard         General transfer comment: MIN guard provided for safety, cues for hand placement with RW.  Ambulation/Gait Ambulation/Gait assistance: Min guard;Supervision Gait Distance (Feet): 160 Feet Assistive device: Rolling walker (2 wheeled) Gait Pattern/deviations: Step-through pattern;Decreased stride length;Decreased weight shift to left Gait velocity: decr   General Gait Details: MIN guard progressing to supervision with quick progression to step through gait pattern with no overt LOB. PT edcuated pt on use of step to if needed for improved stability and to asisst with pain control when experiencing increased pain levels, pt verbalized understanding.   Stairs Stairs: Yes Stairs assistance: Min guard Stair Management: Two rails;Forwards Number of Stairs: 5 General stair comments: Pt able to perform safe stair negotiation with MIN guard and cues for sequencing "up with the good, down with the bad" and use of B handrail. PT educated pt on safe guarding position for family members at home, pt verbalized understanding and comfortability with stair negotiation, provided handout as reminder for safe sequencing.   Wheelchair Mobility    Modified Rankin (Stroke Patients Only)       Balance Overall balance assessment: Needs assistance Sitting-balance support: Feet supported Sitting balance-Leahy Scale: Good     Standing balance support: During functional activity;Bilateral upper extremity supported Standing balance-Leahy Scale: Poor Standing balance comment: use of RW  Cognition Arousal/Alertness: Awake/alert Behavior During Therapy: WFL for tasks assessed/performed Overall Cognitive Status: Within Functional Limits for tasks assessed                                         Exercises Total Joint Exercises Ankle Circles/Pumps: AROM;Both;20 reps;Seated Quad Sets: AROM;Left;10 reps;Seated Short Arc Quad: AROM;Left;10 reps;Seated Heel Slides: AROM;Left;10 reps;Seated Hip ABduction/ADduction: AROM;Left;10 reps;Seated Long Arc Quad: AROM;Left;10 reps;Seated    General Comments        Pertinent Vitals/Pain Pain Assessment: 0-10 Pain Score: 4  Pain Location: Lt hip Pain Descriptors / Indicators: Sore Pain Intervention(s): Limited activity within patient's tolerance;Monitored during session;Repositioned;Ice applied    Home Living                      Prior Function            PT Goals (current goals can now be found in the care plan section) Acute Rehab PT Goals Patient Stated Goal: get home and back to independence to do everything PT Goal Formulation: With patient Time For Goal Achievement: 01/17/21 Potential to Achieve Goals: Good Progress towards PT goals: Progressing toward goals    Frequency    7X/week      PT Plan Current plan remains appropriate    Co-evaluation              AM-PAC PT "6 Clicks" Mobility   Outcome Measure  Help needed turning from your back to your side while in a flat bed without using bedrails?: A Little Help needed moving from lying on your back to sitting on the side of a flat bed without using bedrails?: A Little Help needed moving to and from a bed to a chair (including a wheelchair)?: A Little Help needed standing up from a chair using your arms (e.g., wheelchair or bedside chair)?: A Little Help needed to walk in hospital room?: A Little Help needed climbing 3-5 steps with a railing? : A Little 6 Click Score: 18    End of Session Equipment Utilized During Treatment: Gait belt Activity Tolerance: Patient tolerated treatment well Patient left: in chair;with call bell/phone within reach;with chair alarm set Nurse Communication: Mobility status PT  Visit Diagnosis: Other abnormalities of gait and mobility (R26.89);Muscle weakness (generalized) (M62.81);Pain Pain - Right/Left: Left Pain - part of body: Hip     Time: 0922-0949 PT Time Calculation (min) (ACUTE ONLY): 27 min  Charges:  $Gait Training: 8-22 mins $Therapeutic Exercise: 8-22 mins                     Lyman Speller PT, DPT  Acute Rehabilitation Services  Office [phone]   01/11/2021, 11:45 AM

## 2021-01-14 ENCOUNTER — Encounter (HOSPITAL_COMMUNITY): Payer: Self-pay | Admitting: Orthopaedic Surgery

## 2021-01-16 ENCOUNTER — Telehealth: Payer: Self-pay | Admitting: Orthopaedic Surgery

## 2021-01-16 NOTE — Telephone Encounter (Signed)
Patient called. She says her leg is swollen. Would like to know what to do. Her call back number is [phone]

## 2021-01-23 ENCOUNTER — Ambulatory Visit (INDEPENDENT_AMBULATORY_CARE_PROVIDER_SITE_OTHER): Payer: 59 | Admitting: Physician Assistant

## 2021-01-23 DIAGNOSIS — Z96642 Presence of left artificial hip joint: Secondary | ICD-10-CM

## 2021-01-23 NOTE — Progress Notes (Signed)
Office Visit Note   Patient: [name] [name]           Date of Birth: 1950-04-09           MRN: [id number] Visit Date: 01/23/2021              Requested by: Harvest Forest, MD [address] [name],  [place] [address] PCP: Harvest Forest, MD   Assessment & Plan: Visit Diagnoses:  1. Status post left hip replacement     Plan: Staples were removed Steri-Strips applied.  She is able to get the incision wet but not submerge it in water.  She will work on scar tissue mobilization.  She will begin taking aspirin once a day for another week and then discontinue.  Continue with therapy however I did discuss whether she mainly needs to walk for therapy at this point time.  Follow-up with [place] in 1 month sooner if there is any questions concerns.  Follow-Up Instructions: Return in about 4 weeks (around 02/20/2021).   Orders:  No orders of the defined types were placed in this encounter.  No orders of the defined types were placed in this encounter.     Procedures: No procedures performed   Clinical Data: No additional findings.   Subjective: Chief Complaint  Patient presents with   Left Hip - Follow-up    HPI [name] [name] returns today status post left total hip arthroplasty 01/10/2021.  She is ambulating with a walker states she is overall doing well.  Denies any fevers chills chest pain.  She has been on 81 mg aspirin twice daily.  She is on no aspirin prior to surgery.  She has no concerns. Review of Systems See HPI  Objective: Vital Signs: There were no vitals taken for this visit.  Physical Exam General: Well-developed well-nourished female ambulating with a walker with a nonantalgic gait Ortho Exam Left hip surgical incisions healing well no signs of infection or dehiscence.  Positive seroma.  This aspirated a total of 52 cc of serosanguineous aspirates obtained patient tolerates well.  Left calf supple nontender.  Left ankle dorsiflexion plantarflexion  intact. Specialty Comments:  No specialty comments available.  Imaging: No results found.   PMFS History: Patient Active Problem List   Diagnosis Date Noted   Status post left hip replacement 01/10/2021   Vitamin D deficiency 06/27/2020   Peripheral sensory neuropathy due to type 2 diabetes mellitus (HCC) 12/01/2019   Mild intermittent asthma 12/01/2019   Myositis 12/01/2019   Unilateral primary osteoarthritis, left hip 03/06/2019   Chronic low back pain 01/18/2019   Status post total replacement of right hip 11/18/2018   Unilateral primary osteoarthritis, right hip 09/29/2018   Bilateral primary osteoarthritis of hip 05/25/2018   Spinal stenosis of lumbar region with neurogenic claudication 04/20/2018   Impingement syndrome of right shoulder 04/20/2018   Type II diabetes mellitus, uncontrolled 11/05/2017   Mixed hyperlipidemia 06/04/2017   Acquired hypothyroidism 03/05/2017   Normocytic anemia 01/24/2017   Bilateral carpal tunnel syndrome 05/06/2016   Essential hypertension, benign 02/20/2016   Primary osteoarthritis of left knee 05/24/2014   Elevated blood pressure reading 09/29/2011   Chronic back pain 09/29/2011   Leg edema 09/29/2011   Obesity with body mass index 30 or greater 09/29/2011   Past Medical History:  Diagnosis Date   Allergy    Anemia    Arthritis    Asthma    as child only   Back pain  Diabetes mellitus without complication (HCC)    Hypertension    Hypothyroidism     Family History  Problem Relation Age of Onset   Asthma Mother    Cancer Mother        AML   Leukemia Mother    Heart disease Father    Hypertension Father    Kidney disease Father    Heart attack Brother    Breast cancer Maternal Aunt    Multiple sclerosis Maternal Aunt     Past Surgical History:  Procedure Laterality Date   ABDOMINAL HYSTERECTOMY  1989   fibroid-no longer needs pap   BACK SURGERY  1987, 1985   DDD   CARPAL TUNNEL RELEASE Left 05/28/2016   Procedure:  CARPAL TUNNEL RELEASE;  Surgeon: Cindee Salt, MD;  Location: [facility];  Service: Orthopedics;  Laterality: Left;   CARPAL TUNNEL RELEASE Right 03/17/2018   Procedure: CARPAL TUNNEL RELEASE;  Surgeon: Cindee Salt, MD;  Location: [facility];  Service: Orthopedics;  Laterality: Right;   CARPAL TUNNEL RELEASE Left    TOTAL HIP ARTHROPLASTY Right 11/18/2018   Procedure: RIGHT TOTAL HIP ARTHROPLASTY ANTERIOR APPROACH;  Surgeon: Kathryne Hitch, MD;  Location: [facility];  Service: Orthopedics;  Laterality: Right;   TOTAL HIP ARTHROPLASTY Left 01/10/2021   Procedure: LEFT TOTAL HIP ARTHROPLASTY ANTERIOR APPROACH;  Surgeon: Kathryne Hitch, MD;  Location: [facility];  Service: Orthopedics;  Laterality: Left;   TUBAL LIGATION     Social History   Occupational History   Not on file  Tobacco Use   Smoking status: Never   Smokeless tobacco: Never  Vaping Use   Vaping Use: Never used  Substance and Sexual Activity   Alcohol use: Yes    Comment: social   Drug use: No   Sexual activity: Not Currently

## 2021-02-12 ENCOUNTER — Ambulatory Visit (INDEPENDENT_AMBULATORY_CARE_PROVIDER_SITE_OTHER): Payer: 59 | Admitting: Physician Assistant

## 2021-02-12 ENCOUNTER — Encounter: Payer: Self-pay | Admitting: Physician Assistant

## 2021-02-12 ENCOUNTER — Other Ambulatory Visit: Payer: Self-pay

## 2021-02-12 DIAGNOSIS — Z96642 Presence of left artificial hip joint: Secondary | ICD-10-CM

## 2021-02-12 NOTE — Progress Notes (Signed)
HPI: [name] [name] comes in today wondering if she needs any repeat aspiration of her left hip.  She states left hip overall is doing well.  She just notes some prominence to the lateral aspect of the hip.  Physical exam: Left hip surgical incisions healing well.  She is ambulating with a cane with a slight antalgic gait.  There is no signs of infection no drainage.  There is slight prominence with no appreciable hematoma or seroma.  Impression: Status post left total hip arthroplasty 01/10/2021.  Plan: She will continue work on scar tissue mobilization.  Follow-up with [place] in 3 weeks to see how she is doing overall.  Sooner if there is any questions or concerns.

## 2021-02-20 ENCOUNTER — Encounter: Payer: 59 | Admitting: Physician Assistant

## 2021-02-21 ENCOUNTER — Ambulatory Visit
Admission: RE | Admit: 2021-02-21 | Discharge: 2021-02-21 | Disposition: A | Discharge status: Home / Self Care | Payer: 59 | Source: Ambulatory Visit | Attending: Internal Medicine | Admitting: Internal Medicine

## 2021-02-21 ENCOUNTER — Other Ambulatory Visit: Payer: Self-pay

## 2021-02-21 ENCOUNTER — Other Ambulatory Visit: Payer: 59

## 2021-02-21 ENCOUNTER — Ambulatory Visit: Payer: 59

## 2021-02-21 DIAGNOSIS — Z13820 Encounter for screening for osteoporosis: Secondary | ICD-10-CM

## 2021-02-21 DIAGNOSIS — Z1231 Encounter for screening mammogram for malignant neoplasm of breast: Secondary | ICD-10-CM

## 2021-03-05 ENCOUNTER — Other Ambulatory Visit: Payer: Self-pay

## 2021-03-05 ENCOUNTER — Ambulatory Visit (INDEPENDENT_AMBULATORY_CARE_PROVIDER_SITE_OTHER): Payer: 59 | Admitting: Physician Assistant

## 2021-03-05 ENCOUNTER — Encounter: Payer: Self-pay | Admitting: Physician Assistant

## 2021-03-05 DIAGNOSIS — Z96642 Presence of left artificial hip joint: Secondary | ICD-10-CM

## 2021-03-05 MED ORDER — TRAMADOL HCL 50 MG PO TABS: 50.0000 mg | ORAL_TABLET | Freq: Four times a day (QID) | ORAL | 0 refills | Status: DC | PRN

## 2021-03-05 MED ORDER — METHOCARBAMOL 500 MG PO TABS: 500.0000 mg | ORAL_TABLET | Freq: Four times a day (QID) | ORAL | 1 refills | Status: DC | PRN

## 2021-03-05 NOTE — Addendum Note (Signed)
Addended by: Barbette Or on: 03/05/2021 03:53 PM   Modules accepted: Orders

## 2021-03-05 NOTE — Progress Notes (Signed)
HPI: [name] [name] returns today 7 weeks status post left total hip arthroplasty.  She states she has some achiness down her leg at times.  States the pain is to take quite down the leg.  Ranks her pain to be 7 out of 10 pain at worst.  She denies weakness with flexion of her hip.  She currently taking no medications for the pain.  Denies any radicular symptoms down either leg.  Review of systems: See HPI otherwise negative  Physical exam: General well-developed well-nourished female no acute distress ambulates without any assistive device slight antalgic gait.  Left hip good range of motion without pain.  She does have slight weakness with hip flexion on the left.  Full range of motion of the left knee actively.  Dorsiflexion plantarflexion left ankle intact.  Impression: Status post left total hip arthroplasty 01/10/2021   Plan: We will refill her tramadol and her Robaxin.  Send her to [facility] for physical therapy to work on quad and hip flexion strengthening.  Also home exercise program.  She will follow-up with [place] in 1 month to see how she is doing overall.  Questions were encouraged and answered

## 2021-03-13 ENCOUNTER — Other Ambulatory Visit: Payer: Self-pay | Admitting: Orthopaedic Surgery

## 2021-03-24 ENCOUNTER — Encounter (HOSPITAL_COMMUNITY): Payer: 59

## 2021-03-26 ENCOUNTER — Ambulatory Visit (HOSPITAL_COMMUNITY): Payer: 59 | Attending: Physician Assistant

## 2021-03-28 ENCOUNTER — Other Ambulatory Visit: Payer: Self-pay | Admitting: Physician Assistant

## 2021-04-02 ENCOUNTER — Ambulatory Visit (HOSPITAL_COMMUNITY): Payer: 59

## 2021-04-08 ENCOUNTER — Other Ambulatory Visit: Payer: Self-pay

## 2021-04-08 ENCOUNTER — Ambulatory Visit (HOSPITAL_COMMUNITY): Payer: 59 | Attending: Physician Assistant | Admitting: Physical Therapy

## 2021-04-08 ENCOUNTER — Encounter (HOSPITAL_COMMUNITY): Payer: Self-pay | Admitting: Physical Therapy

## 2021-04-08 DIAGNOSIS — M25552 Pain in left hip: Secondary | ICD-10-CM | POA: Diagnosis present

## 2021-04-08 DIAGNOSIS — Z96642 Presence of left artificial hip joint: Secondary | ICD-10-CM | POA: Diagnosis not present

## 2021-04-08 DIAGNOSIS — R2689 Other abnormalities of gait and mobility: Secondary | ICD-10-CM | POA: Insufficient documentation

## 2021-04-08 NOTE — Therapy (Signed)
[facility] [address] [place], [place], [address] Phone: [phone]   Fax:  [phone]  Physical Therapy Evaluation  Patient Details  Name: [name] [name] MRN: [id number] Date of Birth: 1951-01-01 Referring Provider (PT): Erskine Emery Pa-C   Encounter Date: 04/08/2021   PT End of Session - 04/08/21 1330     Visit Number 1    Number of Visits 8    Date for PT Re-Evaluation 05/06/21    Authorization Type UHC/ Medicaid 2ndary    PT Start Time 1303    PT Stop Time 1340    PT Time Calculation (min) 37 min    Activity Tolerance Patient tolerated treatment well    Behavior During Therapy [facility] for tasks assessed/performed             Past Medical History:  Diagnosis Date   Allergy    Anemia    Arthritis    Asthma    as child only   Back pain    Diabetes mellitus without complication ([place])    Hypertension    Hypothyroidism     Past Surgical History:  Procedure Laterality Date   ABDOMINAL HYSTERECTOMY  1989   fibroid-no longer needs pap   [place]   DDD   CARPAL TUNNEL RELEASE Left 05/28/2016   Procedure: CARPAL TUNNEL RELEASE;  Surgeon: Daryll Brod, MD;  Location: [facility];  Service: Orthopedics;  Laterality: Left;   CARPAL TUNNEL RELEASE Right 03/17/2018   Procedure: CARPAL TUNNEL RELEASE;  Surgeon: Daryll Brod, MD;  Location: [facility];  Service: Orthopedics;  Laterality: Right;   CARPAL TUNNEL RELEASE Left    TOTAL HIP ARTHROPLASTY Right 11/18/2018   Procedure: RIGHT TOTAL HIP ARTHROPLASTY ANTERIOR APPROACH;  Surgeon: Mcarthur Rossetti, MD;  Location: [facility];  Service: Orthopedics;  Laterality: Right;   TOTAL HIP ARTHROPLASTY Left 01/10/2021   Procedure: LEFT TOTAL HIP ARTHROPLASTY ANTERIOR APPROACH;  Surgeon: Mcarthur Rossetti, MD;  Location: [facility];  Service: Orthopedics;  Laterality: Left;   TUBAL LIGATION      There were no vitals filed for this  visit.    Subjective Assessment - 04/08/21 1307     Subjective Patient presents to therapy with complaint of LT hip pain s/p LT THA on 01/30/21. She had home health therapy. She reports having some ongoing weakness in LT hip and has been referred to therapy today for strengthening.    Pertinent History Bilateral THA, lumbar surgery (discectomy) x 2    Limitations Standing;Walking;House hold activities    Patient Stated Goals Get back to walking    Currently in Pain? No/denies                [facility] PT Assessment - 04/08/21 0001       Assessment   Medical Diagnosis LT hip pain/ weakness s/p LT THA    Referring Provider (PT) Erskine Emery Pa-C    Onset Date/Surgical Date 01/30/21    Prior Therapy Yes      Precautions   Precautions None      Restrictions   Weight Bearing Restrictions No      Balance Screen   Has the patient fallen in the past 6 months No      Prior Function   Level of Independence Independent      Cognition   Overall Cognitive Status Within Functional Limits for tasks assessed      ROM / Strength  AROM / PROM / Strength AROM;Strength      AROM   AROM Assessment Site Hip    Right/Left Hip Right;Left    Right Hip Extension 0    Right Hip Flexion 100    Left Hip Extension 0    Left Hip Flexion 85      Strength   Strength Assessment Site Hip;Knee    Right/Left Hip Right;Left    Right Hip Flexion 4+/5    Right Hip Extension 3-/5    Right Hip ABduction 4/5    Left Hip Flexion 3+/5    Left Hip Extension 3-/5    Left Hip ABduction 3+/5    Right/Left Knee Left;Right    Right Knee Extension 5/5    Left Knee Extension 4+/5      Ambulation/Gait   Ambulation/Gait Yes    Ambulation/Gait Assistance 7: Independent    Ambulation Distance (Feet) 500 Feet    Assistive device None    Gait Pattern Step-through pattern    Stairs Yes    Stairs Assistance 6: Modified independent (Device/Increase time)    Stair Management Technique One rail  Left;Alternating pattern    Number of Stairs 8    Height of Stairs 7    Gait Comments 2MWT                        Objective measurements completed on examination: See above findings.       [facility] Adult PT Treatment/Exercise - 04/08/21 0001       Exercises   Exercises Knee/Hip      Knee/Hip Exercises: Supine   Bridges 5 reps                     PT Education - 04/08/21 1308     Education Details on evaluation findings, POC and HEP    Person(s) Educated Patient    Methods Explanation;Handout    Comprehension Verbalized understanding              PT Short Term Goals - 04/08/21 1335       PT SHORT TERM GOAL #1   Title Patient will be independent with initial HEP and self-management strategies to improve functional outcomes    Time 2    Period Weeks    Status New    Target Date 04/22/21               PT Long Term Goals - 04/08/21 1335       PT LONG TERM GOAL #1   Title Patient will be independent with advanced HEP and self-management strategies to improve functional outcomes    Time 4    Period Weeks    Status New    Target Date 05/06/21      PT LONG TERM GOAL #2   Title Patient will have equal to or > 4/5 MMT throughout LLE to improve ability to perform functional mobility, stair ambulation and ADLs.    Time 4    Period Weeks    Status New    Target Date 05/06/21      PT LONG TERM GOAL #3   Title Patient will report at least 75% overall improvement in subjective complaint to indicate improvement in ability to perform ADLs.    Time 4    Period Weeks    Status New    Target Date 05/06/21  Plan - 04/08/21 1331     Clinical Impression Statement Patient is a 71 y.o. female who presents to physical therapy with complaint of Lt hip pain. Patient demonstrates decreased strength, ROM restriction and decreased functional transfers which are likely contributing to symptoms of pain and are negatively  impacting patient ability to perform ADLs and functional mobility tasks. Patient will benefit from skilled physical therapy services to address these deficits to reduce pain, improve level of function with ADLs and functional mobility tasks.    Examination-Activity Limitations Locomotion Level;Squat;Stairs;Stand;Transfers    Examination-Participation Restrictions Yard Work;Laundry;Community Activity;Shop;Cleaning    Stability/Clinical Decision Making Stable/Uncomplicated    Clinical Decision Making Low    Rehab Potential Good    PT Frequency 2x / week    PT Duration 4 weeks    PT Treatment/Interventions ADLs/Self Care Home Management;Biofeedback;DME Instruction;Contrast Bath;Fluidtherapy;Neuromuscular re-education;Compression bandaging;Ultrasound;Parrafin;Visual/perceptual remediation/compensation;Scar mobilization;Passive range of motion;Spinal Manipulations;Patient/family education;Orthotic Fit/Training;Dry needling;Joint Manipulations;Energy conservation;Splinting;Functional mobility training;Stair training;Gait training;Cryotherapy;Electrical Stimulation;Iontophoresis 4mg /ml Dexamethasone;Therapeutic activities;Therapeutic exercise;Moist Heat;Traction;Balance training;Manual lymph drainage;Vasopneumatic Device;Taping;Manual techniques    PT Next Visit Plan Review goals and HEP. Progress functional hip strengthening, balance and ROM as tolerated    PT Home Exercise Plan Eval: heel slide, SLR, bridge, heel raises, hip abduction    Consulted and Agree with Plan of Care Patient             Patient will benefit from skilled therapeutic intervention in order to improve the following deficits and impairments:  Decreased activity tolerance, Pain, Decreased strength, Decreased range of motion, Difficulty walking  Visit Diagnosis: Pain in left hip  Other abnormalities of gait and mobility     Problem List Patient Active Problem List   Diagnosis Date Noted   Status post left hip replacement  01/10/2021   Vitamin D deficiency 06/27/2020   Peripheral sensory neuropathy due to type 2 diabetes mellitus ([place]) 12/01/2019   Mild intermittent asthma 12/01/2019   Myositis 12/01/2019   Unilateral primary osteoarthritis, left hip 03/06/2019   Chronic low back pain 01/18/2019   Status post total replacement of right hip 11/18/2018   Unilateral primary osteoarthritis, right hip 09/29/2018   Bilateral primary osteoarthritis of hip 05/25/2018   Spinal stenosis of lumbar region with neurogenic claudication 04/20/2018   Impingement syndrome of right shoulder 04/20/2018   Type II diabetes mellitus, uncontrolled 11/05/2017   Mixed hyperlipidemia 06/04/2017   Acquired hypothyroidism 03/05/2017   Normocytic anemia 01/24/2017   Bilateral carpal tunnel syndrome 05/06/2016   Essential hypertension, benign 02/20/2016   Primary osteoarthritis of left knee 05/24/2014   Elevated blood pressure reading 09/29/2011   Chronic back pain 09/29/2011   Leg edema 09/29/2011   Obesity with body mass index 30 or greater 09/29/2011   1:39 PM, 04/08/21 Josue Hector PT DPT  Physical Therapist with [facility]  [phone] [place], [place], [address] Phone: [phone]   Fax:  [phone]  Name: [name] [name] MRN: [id number] Date of Birth: [birth year]

## 2021-04-08 NOTE — Patient Instructions (Signed)
Access Code: [email or web address] URL: [email or web address] Date: 04/08/2021 Prepared by: Georges Lynch  Exercises Supine Heel Slide - 2 x daily - 7 x weekly - 2 sets - 10 reps Supine Bridge - 2 x daily - 7 x weekly - 2 sets - 10 reps Active Straight Leg Raise with Quad Set - 2 x daily - 7 x weekly - 2 sets - 10 reps Heel Raises with Counter Support - 2 x daily - 7 x weekly - 2 sets - 10 reps Standing Hip Abduction with Counter Support - 2 x daily - 7 x weekly - 2 sets - 10 reps

## 2021-04-10 ENCOUNTER — Encounter (HOSPITAL_COMMUNITY): Payer: 59

## 2021-04-10 ENCOUNTER — Ambulatory Visit: Payer: 59 | Admitting: Physician Assistant

## 2021-04-14 ENCOUNTER — Ambulatory Visit (HOSPITAL_COMMUNITY): Payer: 59 | Admitting: Physical Therapy

## 2021-04-14 ENCOUNTER — Other Ambulatory Visit: Payer: Self-pay

## 2021-04-14 DIAGNOSIS — R2689 Other abnormalities of gait and mobility: Secondary | ICD-10-CM

## 2021-04-14 DIAGNOSIS — M25552 Pain in left hip: Secondary | ICD-10-CM

## 2021-04-14 NOTE — Therapy (Signed)
[facility] [address] [place], [place], [address] Phone: [phone]   Fax:  [phone]  Physical Therapy Treatment  Patient Details  Name: [name] [name] MRN: [id number] Date of Birth: February 18, 1951 Referring Provider (PT): Richardean Canal Pa-C   Encounter Date: 04/14/2021   PT End of Session - 04/14/21 0932     Visit Number 2    Number of Visits 8    Date for PT Re-Evaluation 05/06/21    Authorization Type UHC/ Medicaid 2ndary    Authorization Time Period 3 visits approved 1/5-1/18    Authorization - Visit Number 1    Authorization - Number of Visits 3    Progress Note Due on Visit 4    PT Start Time [phone]   pt late   PT Stop Time 1003    PT Time Calculation (min) 40 min    Activity Tolerance Patient tolerated treatment well    Behavior During Therapy [facility] for tasks assessed/performed             Past Medical History:  Diagnosis Date   Allergy    Anemia    Arthritis    Asthma    as child only   Back pain    Diabetes mellitus without complication (HCC)    Hypertension    Hypothyroidism     Past Surgical History:  Procedure Laterality Date   ABDOMINAL HYSTERECTOMY  1989   fibroid-no longer needs pap   BACK SURGERY  1987, 1985   DDD   CARPAL TUNNEL RELEASE Left 05/28/2016   Procedure: CARPAL TUNNEL RELEASE;  Surgeon: Cindee Salt, MD;  Location: [facility];  Service: Orthopedics;  Laterality: Left;   CARPAL TUNNEL RELEASE Right 03/17/2018   Procedure: CARPAL TUNNEL RELEASE;  Surgeon: Cindee Salt, MD;  Location: [facility];  Service: Orthopedics;  Laterality: Right;   CARPAL TUNNEL RELEASE Left    TOTAL HIP ARTHROPLASTY Right 11/18/2018   Procedure: RIGHT TOTAL HIP ARTHROPLASTY ANTERIOR APPROACH;  Surgeon: Kathryne Hitch, MD;  Location: [facility];  Service: Orthopedics;  Laterality: Right;   TOTAL HIP ARTHROPLASTY Left 01/10/2021   Procedure: LEFT TOTAL HIP ARTHROPLASTY ANTERIOR  APPROACH;  Surgeon: Kathryne Hitch, MD;  Location: [facility];  Service: Orthopedics;  Laterality: Left;   TUBAL LIGATION      There were no vitals filed for this visit.   Subjective Assessment - 04/14/21 0924     Subjective Pt states that she did the exercises that were given her and has no questions.    Pertinent History Bilateral THA, lumbar surgery (discectomy) x 2    Limitations Standing;Walking;House hold activities    How long can you walk comfortably? 40 minutes    Patient Stated Goals Get back to walking    Currently in Pain? No/denies                               [facility] Adult PT Treatment/Exercise - 04/14/21 0001       Exercises   Exercises Knee/Hip      Knee/Hip Exercises: Aerobic   Nustep hills 3; level 3 x 6'      Knee/Hip Exercises: Standing   Heel Raises Both;15 reps    Forward Step Up Left;10 reps;Hand Hold: 2;Step Height: 6"    Functional Squat 10 reps    Rocker Board 2 minutes    SLS B x 3  Other Standing Knee Exercises LT hip ab/extension x 10 hold 5"      Knee/Hip Exercises: Seated   Long Arc Quad Left;15 reps    Long Arc Quad Weight 3 lbs.    Sit to [employer] 10 reps                       PT Short Term Goals - 04/14/21 0953       PT SHORT TERM GOAL #1   Title Patient will be independent with initial HEP and self-management strategies to improve functional outcomes    Time 2    Period Weeks    Status On-going    Target Date 04/22/21               PT Long Term Goals - 04/14/21 0954       PT LONG TERM GOAL #1   Title Patient will be independent with advanced HEP and self-management strategies to improve functional outcomes    Time 4    Period Weeks    Status On-going    Target Date 05/06/21      PT LONG TERM GOAL #2   Title Patient will have equal to or > 4/5 MMT throughout LLE to improve ability to perform functional mobility, stair ambulation and ADLs.    Time 4    Period Weeks    Status  On-going    Target Date 05/06/21      PT LONG TERM GOAL #3   Title Patient will report at least 75% overall improvement in subjective complaint to indicate improvement in ability to perform ADLs.    Baseline bilat hip ext 3+/5, hip abd 4-/5    Time 4    Period Weeks    Status On-going    Target Date 05/06/21                   Plan - 04/14/21 0934     Clinical Impression Statement Reveiwed evaluation and goals with pt.  Progressed pt to standing activities with update of HEP.  PT able to show good form and tolerance with all exercises.    Examination-Activity Limitations Locomotion Level;Squat;Stairs;Stand;Transfers    Examination-Participation Restrictions Yard Work;Laundry;Community Activity;Shop;Cleaning    Stability/Clinical Decision Making Stable/Uncomplicated    Rehab Potential Good    PT Frequency 2x / week    PT Duration 4 weeks    PT Treatment/Interventions ADLs/Self Care Home Management;Biofeedback;DME Instruction;Contrast Bath;Fluidtherapy;Neuromuscular re-education;Compression bandaging;Ultrasound;Parrafin;Visual/perceptual remediation/compensation;Scar mobilization;Passive range of motion;Spinal Manipulations;Patient/family education;Orthotic Fit/Training;Dry needling;Joint Manipulations;Energy conservation;Splinting;Functional mobility training;Stair training;Gait training;Cryotherapy;Electrical Stimulation;Iontophoresis 4mg /ml Dexamethasone;Therapeutic activities;Therapeutic exercise;Moist Heat;Traction;Balance training;Manual lymph drainage;Vasopneumatic Device;Taping;Manual techniques    PT Next Visit Plan Add vector stance.  Progress functional hip strengthening, balance and ROM as tolerated    PT Home Exercise Plan Eval: heel slide, SLR, bridge, heel raises, hip abduction; 1/9:  heel raises, step up , squat, sit to stand    Consulted and Agree with Plan of Care Patient             Patient will benefit from skilled therapeutic intervention in order to  improve the following deficits and impairments:  Decreased activity tolerance, Pain, Decreased strength, Decreased range of motion, Difficulty walking  Visit Diagnosis: Pain in left hip  Other abnormalities of gait and mobility     Problem List Patient Active Problem List   Diagnosis Date Noted   Status post left hip replacement 01/10/2021   Vitamin D deficiency 06/27/2020   Peripheral sensory neuropathy due  to type 2 diabetes mellitus (HCC) 12/01/2019   Mild intermittent asthma 12/01/2019   Myositis 12/01/2019   Unilateral primary osteoarthritis, left hip 03/06/2019   Chronic low back pain 01/18/2019   Status post total replacement of right hip 11/18/2018   Unilateral primary osteoarthritis, right hip 09/29/2018   Bilateral primary osteoarthritis of hip 05/25/2018   Spinal stenosis of lumbar region with neurogenic claudication 04/20/2018   Impingement syndrome of right shoulder 04/20/2018   Type II diabetes mellitus, uncontrolled 11/05/2017   Mixed hyperlipidemia 06/04/2017   Acquired hypothyroidism 03/05/2017   Normocytic anemia 01/24/2017   Bilateral carpal tunnel syndrome 05/06/2016   Essential hypertension, benign 02/20/2016   Primary osteoarthritis of left knee 05/24/2014   Elevated blood pressure reading 09/29/2011   Chronic back pain 09/29/2011   Leg edema 09/29/2011   Obesity with body mass index 30 or greater 09/29/2011   Virgina Organ, PT CLT [phone]  04/14/2021, 10:05 AM  [facility] [address] [place], [place], [address] Phone: [phone]   Fax:  [phone]  Name: [name] [name] MRN: [id number] Date of Birth: 1950/04/11

## 2021-04-16 ENCOUNTER — Other Ambulatory Visit: Payer: Self-pay

## 2021-04-16 ENCOUNTER — Ambulatory Visit: Payer: Self-pay

## 2021-04-16 ENCOUNTER — Ambulatory Visit (INDEPENDENT_AMBULATORY_CARE_PROVIDER_SITE_OTHER): Payer: 59 | Admitting: Physician Assistant

## 2021-04-16 ENCOUNTER — Encounter: Payer: Self-pay | Admitting: Physician Assistant

## 2021-04-16 DIAGNOSIS — G8929 Other chronic pain: Secondary | ICD-10-CM

## 2021-04-16 DIAGNOSIS — M25562 Pain in left knee: Secondary | ICD-10-CM | POA: Diagnosis not present

## 2021-04-16 MED ORDER — METHYLPREDNISOLONE ACETATE 40 MG/ML IJ SUSP
40.0000 mg | INTRAMUSCULAR | Status: AC | PRN
  Administered 2021-04-16: 40 mg via INTRA_ARTICULAR

## 2021-04-16 MED ORDER — LIDOCAINE HCL 1 % IJ SOLN
3.0000 mL | INTRAMUSCULAR | Status: AC | PRN
  Administered 2021-04-16: 3 mL

## 2021-04-16 NOTE — Progress Notes (Signed)
° °  Procedure Note  Patient: [name] [name]             Date of Birth: 13-Mar-1951           MRN: [id number]             Visit Date: 04/16/2021  HPI: [name] [name] is well-known to Dr. Eliberto Ivory service comes in today status post left total hip arthroplasty 01/10/2021.  She states the hip is doing well.  She is more concerned with her left knee.  She has been having increased pain in the knee.  She is asking about possible gel injections in her knee.  No new injury to the knee.  She notes she has pain over the anterior aspect of the knee occasional swelling stiffness.  Giving way otherwise no mechanical symptoms.  She has tried Voltaren gel for the knee.  She is also on Celebrex.  Patient is diabetic with her most recent hemoglobin A1c 3 months ago of 6.6.  Review of systems: Denies any fevers or chills. Physical exam: General well-developed well-nourished female no acute distress. Left knee: No abnormal warmth erythema.  No gross and stability valgus varus stressing.  Tenderness over the lateral medial joint line.  Overall good range of motion of the knee.  Slight patellofemoral crepitus.   Radiographs: AP lateral views left knee: No acute fracture.  Knee is well located.  Tricompartmental arthritis moderate in all 3 compartments.  No bony abnormalities otherwise.  Procedures: Visit Diagnoses:  1. Chronic pain of left knee     Large Joint Inj: L knee on 04/16/2021 5:19 PM Indications: pain Details: 22 G 1.5 in needle, anterolateral approach  Arthrogram: No  Medications: 3 mL lidocaine 1 %; 40 mg methylPREDNISolone acetate 40 MG/ML Outcome: tolerated well, no immediate complications Procedure, treatment alternatives, risks and benefits explained, specific risks discussed. Consent was given by the patient. Immediately prior to procedure a time out was called to verify the correct patient, procedure, equipment, support staff and site/side marked as required. Patient was prepped and draped in the  usual sterile fashion.    Plan: She will continue her Celebrex and Voltaren gel.  She knows to monitor her glucose levels closely over the next 24 to 48 hours.  See her back in just 2 weeks see how she does with the cortisone injection.  Questions were encouraged and answered at length.

## 2021-04-22 ENCOUNTER — Encounter (HOSPITAL_COMMUNITY): Payer: Self-pay

## 2021-04-22 ENCOUNTER — Ambulatory Visit (HOSPITAL_COMMUNITY): Payer: 59 | Admitting: Physical Therapy

## 2021-04-22 ENCOUNTER — Telehealth (HOSPITAL_COMMUNITY): Payer: Self-pay | Admitting: Physical Therapy

## 2021-04-29 ENCOUNTER — Encounter (HOSPITAL_COMMUNITY): Payer: 59 | Admitting: Physical Therapy

## 2021-04-30 ENCOUNTER — Encounter: Payer: Self-pay | Admitting: Physician Assistant

## 2021-04-30 ENCOUNTER — Ambulatory Visit (INDEPENDENT_AMBULATORY_CARE_PROVIDER_SITE_OTHER): Payer: 59 | Admitting: Physician Assistant

## 2021-04-30 ENCOUNTER — Other Ambulatory Visit: Payer: Self-pay

## 2021-04-30 DIAGNOSIS — M1712 Unilateral primary osteoarthritis, left knee: Secondary | ICD-10-CM

## 2021-04-30 NOTE — Progress Notes (Signed)
HPI: [name] returns today for follow-up of her left knee.  She states the cortisone injection on 04/16/2021 helped.  She is having no mechanical symptoms with pain in the knee.  She does not feel that the injection caused her glucose levels to be elevated.  Physical exam: Left knee good range of motion.  Patellofemoral crepitus.  No instability valgus varus stressing calf supple nontender.  No abnormal warmth erythema or effusion left knee.  Impression: Left knee osteoarthritis Status post left total hip arthroplasty 01/10/2021  Plan: She will continue to work on quad strengthening knee friendly exercises which were discussed with the patient today.  If her knee pain returns before 3 months Recommend supplemental injection.  She gets 3 months or greater relief from the cortisone injection continue with cortisone injections.  However she may benefit from supplemental injection if she fails cortisone injections.  In regards to her left hip we will see her back in 3 months and obtain an AP pelvis and lateral view of the left hip.  Questions were encouraged and answered at length.

## 2021-05-06 ENCOUNTER — Encounter (HOSPITAL_COMMUNITY): Payer: 59 | Admitting: Physical Therapy

## 2021-07-09 ENCOUNTER — Ambulatory Visit (INDEPENDENT_AMBULATORY_CARE_PROVIDER_SITE_OTHER): Payer: 59 | Admitting: Physician Assistant

## 2021-07-09 ENCOUNTER — Encounter: Payer: Self-pay | Admitting: Physician Assistant

## 2021-07-09 DIAGNOSIS — M1712 Unilateral primary osteoarthritis, left knee: Secondary | ICD-10-CM | POA: Diagnosis not present

## 2021-07-09 MED ORDER — DICLOFENAC SODIUM 75 MG PO TBEC: 75.0000 mg | DELAYED_RELEASE_TABLET | Freq: Two times a day (BID) | ORAL | 1 refills | Status: DC

## 2021-07-09 NOTE — Progress Notes (Signed)
? ? ? ? ? ? ? ? ? ? ? ?Office Visit Note ?  ?Patient: [name] [name]           ?Date of Birth: 29-Jul-1950           ?MRN: [id number] ?Visit Date: 07/09/2021 ?             ?Requested by: Harvest Forest, MD ?[address] [place],  [place] [address] ?PCP: Harvest Forest, MD ? ? ?Assessment & Plan: ?Visit Diagnoses:  ?1. Primary osteoarthritis of left knee   ? ? ?Plan: Recommend supplemental injection for her knee.  She has failed conservative treatment otherwise.  We will have her back once supplemental injection is available. ? ?Follow-Up Instructions: Return for Supplemental injection.  ? ?Orders:  ?No orders of the defined types were placed in this encounter. ? ?Meds ordered this encounter  ?Medications  ? diclofenac (VOLTAREN) 75 MG EC tablet  ?  Sig: Take 1 tablet (75 mg total) by mouth 2 (two) times daily.  ?  Dispense:  60 tablet  ?  Refill:  1  ? ? ? ? Procedures: ?No procedures performed ? ? ?Clinical Data: ?No additional findings. ? ? ?Subjective: ?Chief Complaint  ?Patient presents with  ? Left Knee - Pain  ? ? ?HPI ?[name] [name] returns today follow-up for left knee pain.  Last injection was 04/16/2021.  She states the injection really helped a lot but only lasted for about 3 weeks.  She states that her knee pain gets significantly like painful.  She has had no new injury to the knee.  Radiographs of her left knee show moderate tricompartmental arthritis. ?Review of Systems ?See HPI otherwise negative ? ?Objective: ?Vital Signs: There were no vitals taken for this visit. ? ?Physical Exam ?Constitutional:   ?   Appearance: She is not ill-appearing or diaphoretic.  ?Pulmonary:  ?   Effort: Pulmonary effort is normal.  ?Neurological:  ?   Mental Status: She is alert and oriented to person, place, and time.  ?Psychiatric:     ?   Mood and Affect: Mood normal.  ? ? ?Ortho Exam ?Left knee full extension flexion 105 degrees.  No abnormal warmth erythema or effusion.  Nontender with palpation along  medial lateral joint line.  Slight patellofemoral crepitus. ?Specialty Comments:  ?No specialty comments available. ? ?Imaging: ?No results found. ? ? ?PMFS History: ?Patient Active Problem List  ? Diagnosis Date Noted  ? Status post left hip replacement 01/10/2021  ? Vitamin D deficiency 06/27/2020  ? Peripheral sensory neuropathy due to type 2 diabetes mellitus (HCC) 12/01/2019  ? Mild intermittent asthma 12/01/2019  ? Myositis 12/01/2019  ? Unilateral primary osteoarthritis, left hip 03/06/2019  ? Chronic low back pain 01/18/2019  ? Status post total replacement of right hip 11/18/2018  ? Unilateral primary osteoarthritis, right hip 09/29/2018  ? Bilateral primary osteoarthritis of hip 05/25/2018  ? Spinal stenosis of lumbar region with neurogenic claudication 04/20/2018  ? Impingement syndrome of right shoulder 04/20/2018  ? Type II diabetes mellitus, uncontrolled 11/05/2017  ? Mixed hyperlipidemia 06/04/2017  ? Acquired hypothyroidism 03/05/2017  ? Normocytic anemia 01/24/2017  ? Bilateral carpal tunnel syndrome 05/06/2016  ? Essential hypertension, benign 02/20/2016  ? Primary osteoarthritis of left knee 05/24/2014  ? Elevated blood pressure reading 09/29/2011  ? Chronic back pain 09/29/2011  ? Leg edema 09/29/2011  ? Obesity with body mass index 30 or greater 09/29/2011  ? ?Past Medical History:  ?  Diagnosis Date  ? Allergy   ? Anemia   ? Arthritis   ? Asthma   ? as child only  ? Back pain   ? Diabetes mellitus without complication (HCC)   ? Hypertension   ? Hypothyroidism   ?  ?Family History  ?Problem Relation Age of Onset  ? Asthma Mother   ? Cancer Mother   ?     AML  ? Leukemia Mother   ? Heart disease Father   ? Hypertension Father   ? Kidney disease Father   ? Heart attack Brother   ? Breast cancer Maternal Aunt   ? Multiple sclerosis Maternal Aunt   ?  ?Past Surgical History:  ?Procedure Laterality Date  ? ABDOMINAL HYSTERECTOMY  1989  ? fibroid-no longer needs pap  ? BACK SURGERY  1987, 1985  ? DDD  ?  CARPAL TUNNEL RELEASE Left 05/28/2016  ? Procedure: CARPAL TUNNEL RELEASE;  Surgeon: Cindee Salt, MD;  Location: [facility];  Service: Orthopedics;  Laterality: Left;  ? CARPAL TUNNEL RELEASE Right 03/17/2018  ? Procedure: CARPAL TUNNEL RELEASE;  Surgeon: Cindee Salt, MD;  Location: [facility];  Service: Orthopedics;  Laterality: Right;  ? CARPAL TUNNEL RELEASE Left   ? TOTAL HIP ARTHROPLASTY Right 11/18/2018  ? Procedure: RIGHT TOTAL HIP ARTHROPLASTY ANTERIOR APPROACH;  Surgeon: Kathryne Hitch, MD;  Location: [facility];  Service: Orthopedics;  Laterality: Right;  ? TOTAL HIP ARTHROPLASTY Left 01/10/2021  ? Procedure: LEFT TOTAL HIP ARTHROPLASTY ANTERIOR APPROACH;  Surgeon: Kathryne Hitch, MD;  Location: [facility];  Service: Orthopedics;  Laterality: Left;  ? TUBAL LIGATION    ? ?Social History  ? ?Occupational History  ? Not on file  ?Tobacco Use  ? Smoking status: Never  ? Smokeless tobacco: Never  ?Vaping Use  ? Vaping Use: Never used  ?Substance and Sexual Activity  ? Alcohol use: Yes  ?  Comment: social  ? Drug use: No  ? Sexual activity: Not Currently  ? ? ? ? ? ? ?

## 2021-07-10 ENCOUNTER — Telehealth: Payer: Self-pay

## 2021-07-10 NOTE — Telephone Encounter (Signed)
Please get auth for left knee gel injection-gil pt 

## 2021-07-17 ENCOUNTER — Telehealth: Payer: Self-pay

## 2021-07-17 NOTE — Telephone Encounter (Signed)
Noted  

## 2021-07-17 NOTE — Telephone Encounter (Signed)
BV pending for Durolane, left knee. ? ?

## 2021-07-22 ENCOUNTER — Other Ambulatory Visit: Payer: Self-pay

## 2021-07-22 DIAGNOSIS — M1712 Unilateral primary osteoarthritis, left knee: Secondary | ICD-10-CM

## 2021-07-30 ENCOUNTER — Ambulatory Visit: Payer: 59 | Admitting: Physician Assistant

## 2021-08-06 ENCOUNTER — Ambulatory Visit (INDEPENDENT_AMBULATORY_CARE_PROVIDER_SITE_OTHER): Payer: 59 | Admitting: Physician Assistant

## 2021-08-06 ENCOUNTER — Encounter: Payer: Self-pay | Admitting: Physician Assistant

## 2021-08-06 DIAGNOSIS — M1712 Unilateral primary osteoarthritis, left knee: Secondary | ICD-10-CM | POA: Diagnosis not present

## 2021-08-06 MED ORDER — LIDOCAINE HCL 1 % IJ SOLN
3.0000 mL | INTRAMUSCULAR | Status: AC | PRN
  Administered 2021-08-06: 3 mL

## 2021-08-06 NOTE — Progress Notes (Signed)
? ?  Procedure Note ? ?Patient: [name] [name]             ?Date of Birth: [birth year]           ?MRN: [id number]             ?Visit Date: 08/06/2021 ? ?HPI: Mrs. Plouff comes in today for scheduled Durolane injection left knee.  She has tried left knee cortisone injections with minimal relief.  She has known moderate tricompartmental arthritis of the left knee.  She has no scheduled left knee surgical interventions planned for the next 6 months. ? ?Physical exam: Left knee no abnormal warmth erythema.  Slight effusion.  Good range of motion.  Tenderness along medial joint line.  Slight patellofemoral crepitus with passive range of motion. ?Procedures: ?Visit Diagnoses:  ?1. Primary osteoarthritis of left knee   ? ? ?Large Joint Inj: L knee on 08/06/2021 4:48 PM ?Indications: pain ?Details: 22 G 1.5 in needle, superolateral approach ? ?Arthrogram: No ? ?Medications: 3 mL lidocaine 1 % ?Aspirate: 3 mL ?Outcome: tolerated well, no immediate complications ?Procedure, treatment alternatives, risks and benefits explained, specific risks discussed. Consent was given by the patient. Immediately prior to procedure a time out was called to verify the correct patient, procedure, equipment, support staff and site/side marked as required. Patient was prepped and draped in the usual sterile fashion.  ? ? ? ?Plan: She will continue to work on quad strengthening.  Follow-up with [place] on as-needed basis she understands to wait for 6 months between supplemental injections.  Knee was wrapped with an Ace wrap today she will remove this before retiring to bed this evening. ? ?

## 2021-08-08 ENCOUNTER — Encounter (HOSPITAL_COMMUNITY): Payer: Self-pay

## 2021-08-08 ENCOUNTER — Emergency Department (HOSPITAL_COMMUNITY): Payer: 59

## 2021-08-08 ENCOUNTER — Emergency Department (HOSPITAL_COMMUNITY)
Admission: EM | Admit: 2021-08-08 | Discharge: 2021-08-08 | Disposition: A | Discharge status: Home / Self Care | Payer: 59 | Attending: Student | Admitting: Student

## 2021-08-08 ENCOUNTER — Other Ambulatory Visit: Payer: Self-pay

## 2021-08-08 DIAGNOSIS — I10 Essential (primary) hypertension: Secondary | ICD-10-CM | POA: Insufficient documentation

## 2021-08-08 DIAGNOSIS — E119 Type 2 diabetes mellitus without complications: Secondary | ICD-10-CM | POA: Insufficient documentation

## 2021-08-08 DIAGNOSIS — J45909 Unspecified asthma, uncomplicated: Secondary | ICD-10-CM | POA: Insufficient documentation

## 2021-08-08 DIAGNOSIS — Z7984 Long term (current) use of oral hypoglycemic drugs: Secondary | ICD-10-CM | POA: Insufficient documentation

## 2021-08-08 DIAGNOSIS — M25562 Pain in left knee: Secondary | ICD-10-CM | POA: Diagnosis not present

## 2021-08-08 DIAGNOSIS — Z79899 Other long term (current) drug therapy: Secondary | ICD-10-CM | POA: Insufficient documentation

## 2021-08-08 DIAGNOSIS — M25512 Pain in left shoulder: Secondary | ICD-10-CM

## 2021-08-08 DIAGNOSIS — G8929 Other chronic pain: Secondary | ICD-10-CM | POA: Insufficient documentation

## 2021-08-08 MED ORDER — ACETAMINOPHEN 500 MG PO TABS
1000.0000 mg | ORAL_TABLET | Freq: Once | ORAL | Status: AC
  Administered 2021-08-08: 1000 mg via ORAL
  Filled 2021-08-08: qty 2

## 2021-08-08 MED ORDER — METHYLPREDNISOLONE 4 MG PO TBPK: ORAL_TABLET | Freq: Every day | ORAL | 0 refills | Status: DC

## 2021-08-08 MED ORDER — METHYLPREDNISOLONE 4 MG PO TBPK: ORAL_TABLET | Freq: Every day | ORAL | 0 refills | Status: AC

## 2021-08-08 NOTE — ED Notes (Signed)
Pt able to walk to restroom with cane w/o difficulty ?

## 2021-08-08 NOTE — ED Provider Notes (Signed)
?[facility] ?Provider Note ? ? ?CSN: [id number] ?Arrival date & time: 08/08/21  [id number] ? ?  ? ?History ?Chief Complaint  ?Patient presents with  ? Knee Pain  ? ? ?[name] [name] is a 71 y.o. female with h/o HTN, asthma, DM, hyperlipidemia, osteoarthritis presents emerged department for evaluation of left knee pain after joint injection 2 days ago and left arm pain starting earlier yesterday.  She denies any chest pain or shortness of breath.  She denies any trauma or injury.  Denies any heavy lifting.  She reports that her shoulder started hurting whenever she was lifting to put her arm inside a jacket.  For her left knee pain, patient reports that she was having some left knee pain prior to Wednesday, but decided to [facility].  She reports that she has had this injection before and has not really had this reaction, but was having significant pain causing her to come to the emergency department.  She reports she would have follow-up with orthopedic doctor, however they were in surgery that day.  She denies any fevers, numbness, tingling, or weakness, just mentions pain.  No medications tried prior to arrival. ? ? ?Knee Pain ?Associated symptoms: no fever   ? ?  ? ?Home Medications ?Prior to Admission medications   ?Medication Sig Start Date End Date Taking? Authorizing Provider  ?amLODipine (NORVASC) 10 MG tablet Take 10 mg by mouth daily. 04/10/20   [provider]  ?diclofenac (VOLTAREN) 75 MG EC tablet Take 1 tablet (75 mg total) by mouth 2 (two) times daily. 07/09/21   Kirtland Bouchardlark, Gilbert W, PA-C  ?diphenhydrAMINE (BENADRYL) 25 MG tablet Take 0.5 tablets (12.5 mg total) by mouth at bedtime as needed (cramping). 09/30/19   Jacalyn LefevreHaviland, Julie, MD  ?glimepiride (AMARYL) 1 MG tablet Take 0.5 mg by mouth daily with breakfast.    [provider]  ?icosapent Ethyl (VASCEPA) 1 g capsule Take 2 g by mouth 2 (two) times daily. 05/16/20   [provider]   ?ketoconazole (NIZORAL) 2 % cream Apply 1 application topically 2 (two) times daily. 12/24/20   [provider]  ?levothyroxine (SYNTHROID) 75 MCG tablet Take 75 mcg by mouth daily before breakfast.    [provider]  ?methocarbamol (ROBAXIN) 500 MG tablet TAKE ONE TABLET EVERY 6 HOURS AS NEEDED FOR MUSCLE SPASMS 03/28/21   Kirtland Bouchardlark, Gilbert W, PA-C  ?methylPREDNISolone (MEDROL DOSEPAK) 4 MG TBPK tablet Take by mouth daily for 6 days. Taper over 6 days 08/08/21 08/14/21  Achille Richansom, Lacreasha Hinds, PA-C  ?Multiple Vitamins-Minerals (CERTAVITE SENIOR/ANTIOXIDANT) TABS Take 1 tablet by mouth daily.    [provider]  ?pregabalin (LYRICA) 50 MG capsule Take 50 mg by mouth at bedtime.    [provider]  ?traMADol (ULTRAM) 50 MG tablet Take 1 tablet (50 mg total) by mouth every 6 (six) hours as needed. 03/05/21   Kirtland Bouchardlark, Gilbert W, PA-C  ?atorvastatin (LIPITOR) 20 MG tablet Take 20 mg by mouth daily.  07/15/17 09/30/19  [provider]  ?   ? ?Allergies    ?Gabapentin, Celecoxib, Metformin, and Flagyl [metronidazole]   ? ?Review of Systems   ?Review of Systems  ?Constitutional:  Negative for chills and fever.  ?Respiratory:  Negative for shortness of breath.   ?Cardiovascular:  Negative for chest pain and palpitations.  ?Gastrointestinal:  Negative for abdominal pain, constipation, diarrhea, nausea and vomiting.  ?Musculoskeletal:  Positive for arthralgias and joint swelling.  ?Neurological:  Negative for  numbness.  ? ?Physical Exam ?Updated Vital Signs ?BP 115/86   Pulse 89   Temp 98.8 ?F (37.1 ?C)   Resp 18   Ht 5' 8.5" (1.74 m)   Wt 93 kg   SpO2 100%   BMI 30.72 kg/m?  ?Physical Exam ?Vitals and nursing note reviewed.  ?Constitutional:   ?   General: She is not in acute distress. ?   Appearance: Normal appearance. She is not ill-appearing or toxic-appearing.  ?Eyes:  ?   General: No scleral icterus. ?Pulmonary:  ?   Effort: Pulmonary effort is normal. No respiratory distress.   ?Musculoskeletal:     ?   General: Swelling and tenderness present.  ?   Right lower leg: No edema.  ?   Left lower leg: No edema.  ?   Comments: LUE - Mild tenderness near the deltoid insertion to the humerus. Some pain with shoulder AbDuction. No swelling noted. Compartments are soft. Sensation intact throughout. Radial pulse intact. No temperature changes noted. Cap refill brisk. No overlying skin changes noted.  ? ?BACK -  No midline or paraspinal cervical, thoracic, or lumbar tenderness to palpation. No increased warmth noted.  ? ?LLE - Mild swelling noted diffusely to the knee. There is a small bruise to the upper lateral aspect of the knee with pinpoint injection mark. Tenderness is mainly over the medial aspect. Full ROM with pain. Compartments are soft. Palpable DP and PT pulses. Cap refill brisk. She is ambulatory with cane.  ? ?  ?Skin: ?   General: Skin is dry.  ?   Findings: No rash.  ?Neurological:  ?   General: No focal deficit present.  ?   Mental Status: She is alert. Mental status is at baseline.  ?Psychiatric:     ?   Mood and Affect: Mood normal.  ? ? ?ED Results / Procedures / Treatments   ?Labs ?(all labs ordered are listed, but only abnormal results are displayed) ?Labs Reviewed - No data to display ? ?EKG ?None ? ?Radiology ?DG Shoulder Left ? ?Result Date: 08/08/2021 ?CLINICAL DATA:  Provided history: Pain. EXAM: LEFT SHOULDER - 2+ VIEW COMPARISON:  Report from radiographs of the left shoulder 04/22/2007 (images unavailable). FINDINGS: There is normal bony alignment. No evidence of acute osseous or articular abnormality. Mild acromioclavicular joint osteoarthrosis. Aortic atherosclerosis. IMPRESSION: No evidence of acute osseous or articular abnormality. Mild acromioclavicular joint osteoarthrosis. Aortic Atherosclerosis (ICD10-I70.0). Electronically Signed   By: Jackey Loge D.O.   On: 08/08/2021 10:35  ? ?DG Knee Complete 4 Views Left ? ?Result Date: 08/08/2021 ?CLINICAL DATA:  Left knee pain  and swelling after injection yesterday. EXAM: LEFT KNEE - COMPLETE 4+ VIEW COMPARISON:  Left knee x-rays dated April 16, 2021. FINDINGS: No acute fracture or dislocation. Small joint effusion. Unchanged mild medial and moderate lateral compartment joint space narrowing with marginal osteophytes. Chondrocalcinosis of the menisci again noted. Soft tissues are unremarkable. IMPRESSION: 1. No acute osseous abnormality. 2. Unchanged mild medial and moderate lateral compartment osteoarthritis. Electronically Signed   By: Obie Dredge M.D.   On: 08/08/2021 10:35   ? ?Procedures ?Procedures  ? ?Medications Ordered in ED ?Medications  ?acetaminophen (TYLENOL) tablet 1,000 mg (1,000 mg Oral Given 08/08/21 1508)  ? ? ?ED Course/ Medical Decision Making/ A&P ?  ?                        ?Medical Decision Making ?Amount and/or Complexity of Data Reviewed ?Radiology:  ordered. ? ?Risk ?OTC drugs. ?Prescription drug management. ? ? ?71 y/o F presents to the ED for evaluation of left shoulder pain since yesterday and left knee pain for the past two days after injection.  Differential diagnosis includes was not limited to arthritis, sprain, strain, septic arthritis, postinjection pain, gout, pseudogout.  Vital signs are unremarkable.  Patient is normotensive, afebrile, normal pulse rate, satting well on room air without any increased work of breathing.  Physical exam is pertinent for LUE - Mild tenderness near the deltoid insertion to the humerus. Some pain with shoulder AbDuction. No swelling noted. Compartments are soft. Sensation intact throughout. Radial pulse intact. No temperature changes noted. Cap refill brisk. No overlying skin changes noted.  BACK -  No midline or paraspinal cervical, thoracic, or lumbar tenderness to palpation. No increased warmth noted.  LLE - Mild swelling noted diffusely to the knee. There is a small bruise to the upper lateral aspect of the knee with pinpoint injection mark. Tenderness is mainly over  the medial aspect. Full ROM with pain. Compartments are soft. Palpable DP and PT pulses. Cap refill brisk. She is ambulatory with cane.  ? ?Given the pain, will image areas. ? ?I independently reviewed the patient's imaging

## 2021-08-08 NOTE — Discharge Instructions (Addendum)
You were seen here today for evaluation of your left knee pain after your injection on Wednesday. Your imaging shows arthritis. After speaking with orthopedic provider, you may be having an allergic reaction or having typical pain after the injection. You have refused blood work to see if this is infectious. I would like for you to continue using your Voltaren gel topically. I have sent in a prescription for a steroid dose pack for you to take over the next 6 days. You can take Tylenol 1000mg  with this every 6 hours as needed. Additionally, I have included the information on the RICE method. Please take your ACE bandage off today before you go to sleep. Please follow the direction on the box. Orthopedics has recommended that you take Benadryl as well. Please call Dr. office to schedule an appointment for re-evaluation.  ? ?Contact a doctor if: ?The knee pain does not stop. ?The knee pain changes or gets worse. ?You have a fever along with knee pain. ?Your knee is red or feels warm when you touch it. ?Your knee gives out or locks up. ?Get help right away if: ?Your knee swells, and the swelling gets worse. ?You cannot move your knee. ?You have very bad knee pain that does not get better with pain medicine. ?Your arm, hand, or fingers: ?Tingle. ?Are numb. ?Are swollen. ?Are painful. ?Turn white or blue. ?

## 2021-08-08 NOTE — ED Triage Notes (Signed)
Pt presents to ED with complaints of left knee pain and left arm pain started yesterday. Pt states she had injections in her left knee on Wednesday pt states it is more swollen. Pt states left upper arm has started hurting now.  ?

## 2021-08-08 NOTE — ED Notes (Signed)
Pt stating she wants to leave. Provider aware ?

## 2021-08-26 ENCOUNTER — Emergency Department (INDEPENDENT_AMBULATORY_CARE_PROVIDER_SITE_OTHER)
Admission: EM | Admit: 2021-08-26 | Discharge: 2021-08-26 | Disposition: A | Discharge status: Home / Self Care | Payer: 59 | Source: Home / Self Care | Attending: Family Medicine | Admitting: Family Medicine

## 2021-08-26 ENCOUNTER — Encounter: Payer: Self-pay | Admitting: Emergency Medicine

## 2021-08-26 DIAGNOSIS — N390 Urinary tract infection, site not specified: Secondary | ICD-10-CM | POA: Diagnosis not present

## 2021-08-26 LAB — POCT URINALYSIS DIP (MANUAL ENTRY)
Bilirubin, UA: NEGATIVE
Blood, UA: NEGATIVE
Glucose, UA: NEGATIVE mg/dL
Ketones, POC UA: NEGATIVE mg/dL
Nitrite, UA: POSITIVE — AB
Protein Ur, POC: NEGATIVE mg/dL
Spec Grav, UA: 1.02 (ref 1.010–1.025)
Urobilinogen, UA: 0.2 E.U./dL
pH, UA: 8.5 — AB (ref 5.0–8.0)

## 2021-08-26 MED ORDER — CEPHALEXIN 500 MG PO CAPS: 500.0000 mg | ORAL_CAPSULE | Freq: Two times a day (BID) | ORAL | 0 refills | Status: DC

## 2021-08-26 NOTE — Discharge Instructions (Signed)
Take the antibiotic 2 x a day Drink lots of water The culture report will be available in 2-3 days

## 2021-08-26 NOTE — ED Provider Notes (Signed)
[facility]    CSN: [id number] Arrival date & time: 08/26/21  1527      History   Chief Complaint Chief Complaint  Patient presents with   Dysuria    HPI [name] [name] is a 71 y.o. female.   HPI  Patient has had urinary Sx for about a week.  Worsening.  Dysuria and frequency and a suprapubic pressure.  Never has had a UTI previously.  No fever or chills.  NO flank pain.  Past Medical History:  Diagnosis Date   Allergy    Anemia    Arthritis    Asthma    as child only   Back pain    Diabetes mellitus without complication (HCC)    Hypertension    Hypothyroidism     Patient Active Problem List   Diagnosis Date Noted   Status post left hip replacement 01/10/2021   Vitamin D deficiency 06/27/2020   Peripheral sensory neuropathy due to type 2 diabetes mellitus (HCC) 12/01/2019   Mild intermittent asthma 12/01/2019   Myositis 12/01/2019   Unilateral primary osteoarthritis, left hip 03/06/2019   Chronic low back pain 01/18/2019   Status post total replacement of right hip 11/18/2018   Unilateral primary osteoarthritis, right hip 09/29/2018   Bilateral primary osteoarthritis of hip 05/25/2018   Spinal stenosis of lumbar region with neurogenic claudication 04/20/2018   Impingement syndrome of right shoulder 04/20/2018   Type II diabetes mellitus, uncontrolled 11/05/2017   Mixed hyperlipidemia 06/04/2017   Acquired hypothyroidism 03/05/2017   Normocytic anemia 01/24/2017   Bilateral carpal tunnel syndrome 05/06/2016   Essential hypertension, benign 02/20/2016   Primary osteoarthritis of left knee 05/24/2014   Elevated blood pressure reading 09/29/2011   Chronic back pain 09/29/2011   Leg edema 09/29/2011   Obesity with body mass index 30 or greater 09/29/2011    Past Surgical History:  Procedure Laterality Date   ABDOMINAL HYSTERECTOMY  1989   fibroid-no longer needs pap   BACK SURGERY  1987, 1985   DDD   CARPAL TUNNEL RELEASE Left 05/28/2016    Procedure: CARPAL TUNNEL RELEASE;  Surgeon: Cindee SaltGary Kuzma, MD;  Location: [facility];  Service: Orthopedics;  Laterality: Left;   CARPAL TUNNEL RELEASE Right 03/17/2018   Procedure: CARPAL TUNNEL RELEASE;  Surgeon: Cindee SaltKuzma, Gary, MD;  Location: [facility];  Service: Orthopedics;  Laterality: Right;   CARPAL TUNNEL RELEASE Left    TOTAL HIP ARTHROPLASTY Right 11/18/2018   Procedure: RIGHT TOTAL HIP ARTHROPLASTY ANTERIOR APPROACH;  Surgeon: Kathryne HitchBlackman, Christopher Y, MD;  Location: [facility];  Service: Orthopedics;  Laterality: Right;   TOTAL HIP ARTHROPLASTY Left 01/10/2021   Procedure: LEFT TOTAL HIP ARTHROPLASTY ANTERIOR APPROACH;  Surgeon: Kathryne HitchBlackman, Christopher Y, MD;  Location: [facility];  Service: Orthopedics;  Laterality: Left;   TUBAL LIGATION      OB History   No obstetric history on file.      Home Medications    Prior to Admission medications   Medication Sig Start Date End Date Taking? Authorizing Provider  cephALEXin (KEFLEX) 500 MG capsule Take 1 capsule (500 mg total) by mouth 2 (two) times daily. 08/26/21  Yes Eustace MooreNelson, Hetvi Shawhan Sue, MD  amLODipine (NORVASC) 10 MG tablet Take 10 mg by mouth daily. 04/10/20   [provider]  diclofenac (VOLTAREN) 75 MG EC tablet Take 1 tablet (75 mg total) by mouth 2 (two) times daily. 07/09/21   Kirtland Bouchardlark, Gilbert W, PA-C  diphenhydrAMINE (BENADRYL) 25 MG tablet Take 0.5  tablets (12.5 mg total) by mouth at bedtime as needed (cramping). 09/30/19   Jacalyn Lefevre, MD  glimepiride (AMARYL) 1 MG tablet Take 0.5 mg by mouth daily with breakfast.    [provider]  icosapent Ethyl (VASCEPA) 1 g capsule Take 2 g by mouth 2 (two) times daily. 05/16/20   [provider]  ketoconazole (NIZORAL) 2 % cream Apply 1 application topically 2 (two) times daily. 12/24/20   [provider]  levothyroxine (SYNTHROID) 75 MCG tablet Take 75 mcg by mouth daily before breakfast.    [provider]  methocarbamol  (ROBAXIN) 500 MG tablet TAKE ONE TABLET EVERY 6 HOURS AS NEEDED FOR MUSCLE SPASMS 03/28/21   Kirtland Bouchard, PA-C  Multiple Vitamins-Minerals (CERTAVITE SENIOR/ANTIOXIDANT) TABS Take 1 tablet by mouth daily.    [provider]  pregabalin (LYRICA) 50 MG capsule Take 50 mg by mouth at bedtime.    [provider]  traMADol (ULTRAM) 50 MG tablet Take 1 tablet (50 mg total) by mouth every 6 (six) hours as needed. 03/05/21   Kirtland Bouchard, PA-C  atorvastatin (LIPITOR) 20 MG tablet Take 20 mg by mouth daily.  07/15/17 09/30/19  [provider]    Family History Family History  Problem Relation Age of Onset   Asthma Mother    Cancer Mother        AML   Leukemia Mother    Heart disease Father    Hypertension Father    Kidney disease Father    Heart attack Brother    Breast cancer Maternal Aunt    Multiple sclerosis Maternal Aunt     Social History Social History   Tobacco Use   Smoking status: Never   Smokeless tobacco: Never  Vaping Use   Vaping Use: Never used  Substance Use Topics   Alcohol use: Yes    Comment: social   Drug use: No     Allergies   Gabapentin, Celecoxib, Metformin, and Flagyl [metronidazole]   Review of Systems Review of Systems  See HPI Physical Exam Triage Vital Signs ED Triage Vitals  Enc Vitals Group     BP 08/26/21 1537 (!) 143/78     Pulse Rate 08/26/21 1537 84     Resp 08/26/21 1537 16     Temp 08/26/21 1537 98.3 F (36.8 C)     Temp Source 08/26/21 1537 Oral     SpO2 08/26/21 1537 98 %     Weight --      Height --      Head Circumference --      Peak Flow --      Pain Score 08/26/21 1539 5     Pain Loc --      Pain Edu? --      Excl. in GC? --    No data found.  Updated Vital Signs BP (!) 143/78 (BP Location: Right Arm)   Pulse 84   Temp 98.3 F (36.8 C) (Oral)   Resp 16   SpO2 98%      Physical Exam Constitutional:      General: She is not in acute distress.    Appearance: She is  well-developed.  HENT:     Head: Normocephalic and atraumatic.  Eyes:     Conjunctiva/sclera: Conjunctivae normal.     Pupils: Pupils are equal, round, and reactive to light.  Cardiovascular:     Rate and Rhythm: Normal rate.  Pulmonary:     Effort: Pulmonary effort is  normal. No respiratory distress.  Abdominal:     General: There is no distension.     Palpations: Abdomen is soft.     Tenderness: There is no right CVA tenderness or left CVA tenderness.  Musculoskeletal:        General: Normal range of motion.     Cervical back: Normal range of motion.  Skin:    General: Skin is warm and dry.  Neurological:     Mental Status: She is alert.     UC Treatments / Results  Labs (all labs ordered are listed, but only abnormal results are displayed) Labs Reviewed  POCT URINALYSIS DIP (MANUAL ENTRY) - Abnormal; Notable for the following components:      Result Value   pH, UA 8.5 (*)    Nitrite, UA Positive (*)    Leukocytes, UA Trace (*)    All other components within normal limits    EKG   Radiology No results found.  Procedures Procedures (including critical care time)  Medications Ordered in UC Medications - No data to display  Initial Impression / Assessment and Plan / UC Course  I have reviewed the triage vital signs and the nursing notes.  Pertinent labs & imaging results that were available during my care of the patient were reviewed by me and considered in my medical decision making (see chart for details).     Final Clinical Impressions(s) / UC Diagnoses   Final diagnoses:  Lower urinary tract infectious disease     Discharge Instructions      Take the antibiotic 2 x a day Drink lots of water The culture report will be available in 2-3 days   ED Prescriptions     Medication Sig Dispense Auth. Provider   cephALEXin (KEFLEX) 500 MG capsule Take 1 capsule (500 mg total) by mouth 2 (two) times daily. 10 capsule Eustace Moore, MD      PDMP  not reviewed this encounter.   Eustace Moore, MD 08/26/21 [phone]

## 2021-08-26 NOTE — ED Triage Notes (Signed)
Pt states for the last week she has been having urinary frequency and yesterday she developed dysuria and pressure. States she took 3 leftover amox tablets yesterday.

## 2021-08-29 ENCOUNTER — Telehealth (HOSPITAL_COMMUNITY): Payer: Self-pay | Admitting: Emergency Medicine

## 2021-08-29 LAB — URINE CULTURE
MICRO NUMBER:: 13436355
SPECIMEN QUALITY:: ADEQUATE

## 2021-09-03 ENCOUNTER — Telehealth (HOSPITAL_COMMUNITY): Payer: Self-pay | Admitting: Emergency Medicine

## 2021-09-03 MED ORDER — CEFDINIR 300 MG PO CAPS: 300.0000 mg | ORAL_CAPSULE | Freq: Two times a day (BID) | ORAL | 0 refills | Status: AC

## 2021-09-03 NOTE — Telephone Encounter (Signed)
Patient called and states symptoms have not resolved.    Reviewed with Dr. Leonides Grills, and will send Cipro.   Reviewed with patient, verified pharmacy, prescription sent  Upon review, Cipro had contraindication with patient's Glimepiride, switched to [facility], per Dr. Tracie Harrier.

## 2021-09-09 ENCOUNTER — Telehealth: Payer: Self-pay | Admitting: Orthopaedic Surgery

## 2021-09-09 ENCOUNTER — Other Ambulatory Visit: Payer: Self-pay | Admitting: Physical Medicine and Rehabilitation

## 2021-09-09 ENCOUNTER — Other Ambulatory Visit: Payer: Self-pay | Admitting: Physician Assistant

## 2021-09-09 DIAGNOSIS — M961 Postlaminectomy syndrome, not elsewhere classified: Secondary | ICD-10-CM

## 2021-09-09 DIAGNOSIS — M48062 Spinal stenosis, lumbar region with neurogenic claudication: Secondary | ICD-10-CM

## 2021-09-09 DIAGNOSIS — M5416 Radiculopathy, lumbar region: Secondary | ICD-10-CM

## 2021-09-09 NOTE — Telephone Encounter (Signed)
Called patient left message to return call to schedule an appointment with Dr. Lorin Mercy

## 2021-09-22 ENCOUNTER — Inpatient Hospital Stay (HOSPITAL_COMMUNITY): Admission: RE | Admit: 2021-09-22 | Payer: 59 | Source: Ambulatory Visit

## 2021-09-22 ENCOUNTER — Encounter (HOSPITAL_COMMUNITY): Payer: Self-pay

## 2021-09-26 ENCOUNTER — Other Ambulatory Visit (HOSPITAL_COMMUNITY): Payer: Self-pay | Admitting: Internal Medicine

## 2021-09-26 DIAGNOSIS — M7989 Other specified soft tissue disorders: Secondary | ICD-10-CM

## 2021-09-30 ENCOUNTER — Ambulatory Visit: Payer: Self-pay

## 2021-09-30 ENCOUNTER — Ambulatory Visit (INDEPENDENT_AMBULATORY_CARE_PROVIDER_SITE_OTHER): Payer: 59 | Admitting: Orthopaedic Surgery

## 2021-09-30 ENCOUNTER — Encounter: Payer: Self-pay | Admitting: Orthopaedic Surgery

## 2021-09-30 VITALS — BP 127/77 | HR 75 | Ht 67.5 in | Wt 210.0 lb

## 2021-09-30 DIAGNOSIS — M5416 Radiculopathy, lumbar region: Secondary | ICD-10-CM

## 2021-09-30 DIAGNOSIS — M48062 Spinal stenosis, lumbar region with neurogenic claudication: Secondary | ICD-10-CM | POA: Diagnosis not present

## 2021-09-30 NOTE — Progress Notes (Signed)
Office Visit Note   Patient: [name] [name]           Date of Birth: [birth year]           MRN: [id number] Visit Date: 09/30/2021              Requested by: Juanda Chance, NP [address] [place],  [place] [address] PCP: Harvest Forest, MD   Assessment & Plan: Visit Diagnoses:  1. Lumbar radiculopathy   2. Spinal stenosis of lumbar region with neurogenic claudication     Plan: Patient has a severe stenosis at L3-4 and MRI scan prior to 2019 showed moderate to severe stenosis at L4-5.  Previous surgery at L5-S1 x2 in the past years ago.  She states she wants to wait till possibly next year to undergo decompression surgery since she is paying off her medical bills.  She states she like to return in 6 months to discuss decompression surgery.  Follow-Up Instructions: Return in about 6 months (around 04/01/2022).   Orders:  Orders Placed This Encounter  Procedures   XR Lumbar Spine 2-3 Views   No orders of the defined types were placed in this encounter.     Procedures: No procedures performed   Clinical Data: No additional findings.   Subjective: Chief Complaint  Patient presents with   Lower Back - Pain    HPI 71 year old female returns with lumbar spinal stenosis with neurogenic claudication symptoms increased pain x6 weeks.  She has been using tramadol for ankle pain.  She can stand for for 5 minutes can ambulate from the exam room to her car think she can make it back without stopping.  She gets relief with sitting.  Patient ambulates without a cane or walker.  She has more pain in the left leg than right with standing and walking.  She gets relief sitting position does better leaning over a grocery cart or stopping and leaning forward on her elbows.  Review of Systems past for diabetes her last A1c 6.69 months ago.  There is right left total hip arthroplasty is doing well.     Objective: Vital Signs: BP 127/77   Pulse 75   Ht 5' 7.5" (1.715 m)   Wt 210  lb (95.3 kg)   BMI 32.41 kg/m   Physical Exam Constitutional:      Appearance: She is well-developed.  HENT:     Head: Normocephalic.     Right Ear: External ear normal.     Left Ear: External ear normal. There is no impacted cerumen.  Eyes:     Pupils: Pupils are equal, round, and reactive to light.  Neck:     Thyroid: No thyromegaly.     Trachea: No tracheal deviation.  Cardiovascular:     Rate and Rhythm: Normal rate.  Pulmonary:     Effort: Pulmonary effort is normal.  Abdominal:     Palpations: Abdomen is soft.  Musculoskeletal:     Cervical back: No rigidity.  Skin:    General: Skin is warm and dry.  Neurological:     Mental Status: She is alert and oriented to person, place, and time.  Psychiatric:        Behavior: Behavior normal.     Ortho Exam reflexes are intact negative logroll hips knees reach full extension.  No motor deficit lower extremities.  Pulses are normal.  Lumbar incision L5-S1.  Specialty Comments:  No specialty comments available.  Imaging: No results found.  PMFS History: Patient Active Problem List   Diagnosis Date Noted   Status post left hip replacement 01/10/2021   Vitamin D deficiency 06/27/2020   Peripheral sensory neuropathy due to type 2 diabetes mellitus (HCC) 12/01/2019   Mild intermittent asthma 12/01/2019   Myositis 12/01/2019   Unilateral primary osteoarthritis, left hip 03/06/2019   Chronic low back pain 01/18/2019   Status post total replacement of right hip 11/18/2018   Unilateral primary osteoarthritis, right hip 09/29/2018   Bilateral primary osteoarthritis of hip 05/25/2018   Spinal stenosis of lumbar region with neurogenic claudication 04/20/2018   Impingement syndrome of right shoulder 04/20/2018   Type II diabetes mellitus, uncontrolled 11/05/2017   Mixed hyperlipidemia 06/04/2017   Acquired hypothyroidism 03/05/2017   Normocytic anemia 01/24/2017   Bilateral carpal tunnel syndrome 05/06/2016   Essential  hypertension, benign 02/20/2016   Primary osteoarthritis of left knee 05/24/2014   Elevated blood pressure reading 09/29/2011   Chronic back pain 09/29/2011   Leg edema 09/29/2011   Obesity with body mass index 30 or greater 09/29/2011   Past Medical History:  Diagnosis Date   Allergy    Anemia    Arthritis    Asthma    as child only   Back pain    Diabetes mellitus without complication (HCC)    Hypertension    Hypothyroidism     Family History  Problem Relation Age of Onset   Asthma Mother    Cancer Mother        AML   Leukemia Mother    Heart disease Father    Hypertension Father    Kidney disease Father    Heart attack Brother    Breast cancer Maternal Aunt    Multiple sclerosis Maternal Aunt     Past Surgical History:  Procedure Laterality Date   ABDOMINAL HYSTERECTOMY  1989   fibroid-no longer needs pap   BACK SURGERY  1987, 1985   DDD   CARPAL TUNNEL RELEASE Left 05/28/2016   Procedure: CARPAL TUNNEL RELEASE;  Surgeon: Cindee Salt, MD;  Location: [facility];  Service: Orthopedics;  Laterality: Left;   CARPAL TUNNEL RELEASE Right 03/17/2018   Procedure: CARPAL TUNNEL RELEASE;  Surgeon: Cindee Salt, MD;  Location: [facility];  Service: Orthopedics;  Laterality: Right;   CARPAL TUNNEL RELEASE Left    TOTAL HIP ARTHROPLASTY Right 11/18/2018   Procedure: RIGHT TOTAL HIP ARTHROPLASTY ANTERIOR APPROACH;  Surgeon: Kathryne Hitch, MD;  Location: [facility];  Service: Orthopedics;  Laterality: Right;   TOTAL HIP ARTHROPLASTY Left 01/10/2021   Procedure: LEFT TOTAL HIP ARTHROPLASTY ANTERIOR APPROACH;  Surgeon: Kathryne Hitch, MD;  Location: [facility];  Service: Orthopedics;  Laterality: Left;   TUBAL LIGATION     Social History   Occupational History   Not on file  Tobacco Use   Smoking status: Never   Smokeless tobacco: Never  Vaping Use   Vaping Use: Never used  Substance and Sexual Activity   Alcohol use: Yes    Comment:  social   Drug use: No   Sexual activity: Not Currently

## 2021-10-03 ENCOUNTER — Encounter (HOSPITAL_COMMUNITY): Payer: Self-pay

## 2021-10-03 ENCOUNTER — Ambulatory Visit (HOSPITAL_COMMUNITY): Admission: RE | Admit: 2021-10-03 | Payer: 59 | Source: Ambulatory Visit

## 2021-10-30 ENCOUNTER — Ambulatory Visit (HOSPITAL_COMMUNITY)
Admission: RE | Admit: 2021-10-30 | Discharge: 2021-10-30 | Disposition: A | Discharge status: Home / Self Care | Payer: 59 | Source: Ambulatory Visit | Attending: Cardiology | Admitting: Cardiology

## 2021-10-30 DIAGNOSIS — M7989 Other specified soft tissue disorders: Secondary | ICD-10-CM | POA: Diagnosis not present

## 2021-11-18 ENCOUNTER — Other Ambulatory Visit (HOSPITAL_COMMUNITY): Payer: Self-pay | Admitting: Family Medicine

## 2021-11-18 DIAGNOSIS — M7989 Other specified soft tissue disorders: Secondary | ICD-10-CM

## 2021-11-19 ENCOUNTER — Telehealth: Payer: Self-pay | Admitting: Physician Assistant

## 2021-11-19 NOTE — Telephone Encounter (Signed)
Told patient she needs appt, she is scheduled for this coming Monday

## 2021-11-19 NOTE — Telephone Encounter (Signed)
Pt called requesting a call back from PA Clark. Pt states she has medical questions. Please call pt at [phone]

## 2021-11-20 ENCOUNTER — Other Ambulatory Visit: Payer: Self-pay | Admitting: Physician Assistant

## 2021-11-24 ENCOUNTER — Ambulatory Visit (INDEPENDENT_AMBULATORY_CARE_PROVIDER_SITE_OTHER): Payer: 59 | Admitting: Orthopaedic Surgery

## 2021-11-24 ENCOUNTER — Encounter: Payer: Self-pay | Admitting: Orthopaedic Surgery

## 2021-11-24 DIAGNOSIS — G8929 Other chronic pain: Secondary | ICD-10-CM

## 2021-11-24 DIAGNOSIS — M1712 Unilateral primary osteoarthritis, left knee: Secondary | ICD-10-CM | POA: Diagnosis not present

## 2021-11-24 DIAGNOSIS — M25562 Pain in left knee: Secondary | ICD-10-CM

## 2021-11-24 MED ORDER — METHYLPREDNISOLONE ACETATE 40 MG/ML IJ SUSP
40.0000 mg | INTRAMUSCULAR | Status: AC | PRN
  Administered 2021-11-24: 40 mg via INTRA_ARTICULAR

## 2021-11-24 MED ORDER — LIDOCAINE HCL 1 % IJ SOLN
3.0000 mL | INTRAMUSCULAR | Status: AC | PRN
  Administered 2021-11-24: 3 mL

## 2021-11-24 NOTE — Progress Notes (Signed)
Office Visit Note   Patient: [name] [name]           Date of Birth: Nov 26, 1950           MRN: [id number] Visit Date: 11/24/2021              Requested by: Harvest Forest, MD [address] [name],  [place] [address] PCP: Harvest Forest, MD   Assessment & Plan: Visit Diagnoses:  1. Primary osteoarthritis of left knee   2. Chronic pain of left knee     Plan: I recommended a steroid injection for her left knee issues she has not had one in a while.  We talked about knee replacement surgery and I showed her knee replacement model.  She said that she needs some help at home before considering knee replacement surgery.  I did not aspirate much fluid from that left knee but it is obviously arthritic knee.  I placed a steroid injection in it which she tolerated well.  She knows to wait at least 3 months between injections.  She will let us know if she needs another injection down the road versus knee replacement surgery.  Follow-Up Instructions: Return if symptoms worsen or fail to improve.   Orders:  Orders Placed This Encounter  Procedures   Large Joint Inj   No orders of the defined types were placed in this encounter.     Procedures: Large Joint Inj: L knee on 11/24/2021 8:33 AM Indications: diagnostic evaluation and pain Details: 22 G 1.5 in needle, superolateral approach  Arthrogram: No  Medications: 3 mL lidocaine 1 %; 40 mg methylPREDNISolone acetate 40 MG/ML Outcome: tolerated well, no immediate complications Procedure, treatment alternatives, risks and benefits explained, specific risks discussed. Consent was given by the patient. Immediately prior to procedure a time out was called to verify the correct patient, procedure, equipment, support staff and site/side marked as required. Patient was prepped and draped in the usual sterile fashion.       Clinical Data: No additional findings.   Subjective: Chief Complaint  Patient presents with    Left Leg - Edema   Right Leg - Edema  The patient comes in today with mainly left knee pain.  This is caused her to walk differently and does lead to her having right hip pain.  We have replaced both of her hips.  Her right hip pain is more of a bursitis over the trochanteric area.  Her left knee does have well-documented valgus malalignment and end-stage arthritis.  She had a hyaluronic acid injection in that left knee 2 months ago and has not really helped much.  It is now because including ankle swelling.  She has been worked up for blood clot and it was negative.  She has echo coming up as well.  I believe that a lot of her issues as relates to swelling in her feet and ankle on the left side as well as right hip pain is contributed directly to her severe left knee osteoarthritis.  HPI  Review of Systems There is currently listed no headache, chest pain, shortness of breath, fever, chills, nausea, vomiting  Objective: Vital Signs: There were no vitals taken for this visit.  Physical Exam She is alert and orient x3 and in no acute distress Ortho Exam She does walk with a limp.  Her left knee has an effusion.  There is valgus malalignment of the left knee and painful arc of motion of  that knee.  I believe this led to her walking different based on my exam.  There is medial and lateral joint line tenderness. Specialty Comments:  No specialty comments available.  Imaging: No results found. Previous x-rays of the left knee show severe end-stage arthritis with valgus malalignment and bone-on-bone wear of all 3 compartments.  There are osteophytes in all 3 compartments.  PMFS History: Patient Active Problem List   Diagnosis Date Noted   Status post left hip replacement 01/10/2021   Vitamin D deficiency 06/27/2020   Peripheral sensory neuropathy due to type 2 diabetes mellitus (HCC) 12/01/2019   Mild intermittent asthma 12/01/2019   Myositis 12/01/2019   Unilateral primary osteoarthritis,  left hip 03/06/2019   Chronic low back pain 01/18/2019   Status post total replacement of right hip 11/18/2018   Unilateral primary osteoarthritis, right hip 09/29/2018   Bilateral primary osteoarthritis of hip 05/25/2018   Spinal stenosis of lumbar region with neurogenic claudication 04/20/2018   Impingement syndrome of right shoulder 04/20/2018   Type II diabetes mellitus, uncontrolled 11/05/2017   Mixed hyperlipidemia 06/04/2017   Acquired hypothyroidism 03/05/2017   Normocytic anemia 01/24/2017   Bilateral carpal tunnel syndrome 05/06/2016   Essential hypertension, benign 02/20/2016   Primary osteoarthritis of left knee 05/24/2014   Elevated blood pressure reading 09/29/2011   Chronic back pain 09/29/2011   Leg edema 09/29/2011   Obesity with body mass index 30 or greater 09/29/2011   Past Medical History:  Diagnosis Date   Allergy    Anemia    Arthritis    Asthma    as child only   Back pain    Diabetes mellitus without complication (HCC)    Hypertension    Hypothyroidism     Family History  Problem Relation Age of Onset   Asthma Mother    Cancer Mother        AML   Leukemia Mother    Heart disease Father    Hypertension Father    Kidney disease Father    Heart attack Brother    Breast cancer Maternal Aunt    Multiple sclerosis Maternal Aunt     Past Surgical History:  Procedure Laterality Date   ABDOMINAL HYSTERECTOMY  1989   fibroid-no longer needs pap   BACK SURGERY  1987, 1985   DDD   CARPAL TUNNEL RELEASE Left 05/28/2016   Procedure: CARPAL TUNNEL RELEASE;  Surgeon: Cindee Salt, MD;  Location: [facility];  Service: Orthopedics;  Laterality: Left;   CARPAL TUNNEL RELEASE Right 03/17/2018   Procedure: CARPAL TUNNEL RELEASE;  Surgeon: Cindee Salt, MD;  Location: [facility];  Service: Orthopedics;  Laterality: Right;   CARPAL TUNNEL RELEASE Left    TOTAL HIP ARTHROPLASTY Right 11/18/2018   Procedure: RIGHT TOTAL HIP ARTHROPLASTY  ANTERIOR APPROACH;  Surgeon: Kathryne Hitch, MD;  Location: [facility];  Service: Orthopedics;  Laterality: Right;   TOTAL HIP ARTHROPLASTY Left 01/10/2021   Procedure: LEFT TOTAL HIP ARTHROPLASTY ANTERIOR APPROACH;  Surgeon: Kathryne Hitch, MD;  Location: [facility];  Service: Orthopedics;  Laterality: Left;   TUBAL LIGATION     Social History   Occupational History   Not on file  Tobacco Use   Smoking status: Never   Smokeless tobacco: Never  Vaping Use   Vaping Use: Never used  Substance and Sexual Activity   Alcohol use: Yes    Comment: social   Drug use: No   Sexual activity: Not Currently

## 2021-12-04 ENCOUNTER — Ambulatory Visit (HOSPITAL_BASED_OUTPATIENT_CLINIC_OR_DEPARTMENT_OTHER): Payer: 59 | Admitting: Obstetrics & Gynecology

## 2021-12-04 ENCOUNTER — Ambulatory Visit (INDEPENDENT_AMBULATORY_CARE_PROVIDER_SITE_OTHER): Payer: 59

## 2021-12-04 DIAGNOSIS — M7989 Other specified soft tissue disorders: Secondary | ICD-10-CM | POA: Diagnosis not present

## 2021-12-04 DIAGNOSIS — R600 Localized edema: Secondary | ICD-10-CM

## 2021-12-04 LAB — ECHOCARDIOGRAM COMPLETE
Area-P 1/2: 3.53 cm2
S' Lateral: 1.76 cm

## 2022-01-06 ENCOUNTER — Other Ambulatory Visit: Payer: Self-pay | Admitting: Internal Medicine

## 2022-01-06 DIAGNOSIS — Z1231 Encounter for screening mammogram for malignant neoplasm of breast: Secondary | ICD-10-CM

## 2022-01-14 ENCOUNTER — Ambulatory Visit
Admission: EM | Admit: 2022-01-14 | Discharge: 2022-01-14 | Disposition: A | Discharge status: Home / Self Care | Payer: 59

## 2022-01-14 ENCOUNTER — Encounter: Payer: Self-pay | Admitting: Family Medicine

## 2022-01-14 ENCOUNTER — Ambulatory Visit: Payer: Self-pay

## 2022-01-14 ENCOUNTER — Encounter: Payer: Self-pay | Admitting: Emergency Medicine

## 2022-01-14 ENCOUNTER — Ambulatory Visit (INDEPENDENT_AMBULATORY_CARE_PROVIDER_SITE_OTHER): Payer: 59

## 2022-01-14 ENCOUNTER — Ambulatory Visit (INDEPENDENT_AMBULATORY_CARE_PROVIDER_SITE_OTHER): Payer: 59 | Admitting: Family Medicine

## 2022-01-14 VITALS — BP 170/80 | Ht 67.5 in | Wt 212.0 lb

## 2022-01-14 DIAGNOSIS — M25512 Pain in left shoulder: Secondary | ICD-10-CM | POA: Diagnosis not present

## 2022-01-14 DIAGNOSIS — M19012 Primary osteoarthritis, left shoulder: Secondary | ICD-10-CM | POA: Diagnosis not present

## 2022-01-14 DIAGNOSIS — M25412 Effusion, left shoulder: Secondary | ICD-10-CM | POA: Diagnosis not present

## 2022-01-14 MED ORDER — TRIAMCINOLONE ACETONIDE 40 MG/ML IJ SUSP
40.0000 mg | Freq: Once | INTRAMUSCULAR | Status: AC
  Administered 2022-01-14: 40 mg via INTRA_ARTICULAR

## 2022-01-14 MED ORDER — METHOCARBAMOL 500 MG PO TABS: 500.0000 mg | ORAL_TABLET | Freq: Three times a day (TID) | ORAL | 0 refills | Status: DC | PRN

## 2022-01-14 NOTE — Discharge Instructions (Addendum)
Advised/informed patient of left shoulder x-ray results with hard copy provided to patient.  Advised patient to RICE left shoulder for 30 minutes 3 times daily for the next 3 days.  Advised may use Robaxin daily or as needed for left shoulder discomfort.  Advised if symptoms worsen and/or unresolved please follow-up with [facility] orthopedic provider for further evaluation (contact information is below).  Encouraged patient to increase daily water intake while taking this medication.

## 2022-01-14 NOTE — Patient Instructions (Signed)
Nice to meet you Please alternate heat and ice  Please consider compression  Please try the exercises   Please send me a message in MyChart with any questions or updates.  Please see me back in 4 weeks.   --Dr. Raeford Razor

## 2022-01-14 NOTE — ED Triage Notes (Signed)
Pt has  a swollen  left knee that she received a cortisone injection for in September  Pt uses her left arm & shoulder to push up & it has caused ongoing pain to left shoulder  Pain was worse last night  Took a family member's  hydrocodone  last night/heating pad- min relief  Has tramadol - has not taken any - it is for a cruise on Friday

## 2022-01-14 NOTE — Assessment & Plan Note (Signed)
Acutely occurring.  Symptoms most consistent with irritation of the joint space.  -Counseled on home exercise therapy and supportive care. -injection today. -Count on compression. -Could consider physical therapy or further imaging.

## 2022-01-14 NOTE — Progress Notes (Signed)
[name] [name] - 71 y.o. female MRN [id number]  Date of birth: 08-May-1950  SUBJECTIVE:  Including CC & ROS.  No chief complaint on file.   [name] [name] is a 71 y.o. female that is presenting with acute left shoulder pain.  The pain is occurring globally around the joint.  She was unable to sleep on that side last night.  Has been ongoing for the past 3 days.  No injury or inciting event.  Review of the urgent care note from 10/11 shows she is counseled on supportive care. Independent review of left shoulder x-ray from 10/11 shows mild degenerative glenohumeral disease.  Review of Systems See HPI   HISTORY: Past Medical, Surgical, Social, and Family History Reviewed & Updated per EMR.   Pertinent Historical Findings include:  Past Medical History:  Diagnosis Date   Allergy    Anemia    Arthritis    Asthma    as child only   Back pain    Diabetes mellitus without complication ([place])    Hypertension    Hypothyroidism     Past Surgical History:  Procedure Laterality Date   ABDOMINAL HYSTERECTOMY  1989   fibroid-no longer needs pap   [place]   DDD   CARPAL TUNNEL RELEASE Left 05/28/2016   Procedure: CARPAL TUNNEL RELEASE;  Surgeon: Daryll Brod, MD;  Location: [facility];  Service: Orthopedics;  Laterality: Left;   CARPAL TUNNEL RELEASE Right 03/17/2018   Procedure: CARPAL TUNNEL RELEASE;  Surgeon: Daryll Brod, MD;  Location: [facility];  Service: Orthopedics;  Laterality: Right;   CARPAL TUNNEL RELEASE Left    TOTAL HIP ARTHROPLASTY Right 11/18/2018   Procedure: RIGHT TOTAL HIP ARTHROPLASTY ANTERIOR APPROACH;  Surgeon: Mcarthur Rossetti, MD;  Location: [facility];  Service: Orthopedics;  Laterality: Right;   TOTAL HIP ARTHROPLASTY Left 01/10/2021   Procedure: LEFT TOTAL HIP ARTHROPLASTY ANTERIOR APPROACH;  Surgeon: Mcarthur Rossetti, MD;  Location: [facility];  Service: Orthopedics;  Laterality: Left;   TUBAL LIGATION        PHYSICAL EXAM:  VS: BP (!) 170/80 (BP Location: Right Arm, [name] Position: Sitting)   Ht 5' 7.5" (1.715 m)   Wt 212 lb (96.2 kg)   BMI 32.71 kg/m  Physical Exam Gen: NAD, alert, cooperative with exam, well-appearing MSK:  Neurovascularly intact     Aspiration/Injection Procedure Note [name] [name] March 25, 1951  Procedure: Injection Indications: Left shoulder pain  Procedure Details Consent: Risks of procedure as well as the alternatives and risks of each were explained to the ([name]/caregiver).  Consent for procedure obtained. Time Out: Verified [name] identification, verified procedure, site/side was marked, verified correct [name] position, special equipment/implants available, medications/allergies/relevent history reviewed, required imaging and test results available.  Performed.  The area was cleaned with iodine and alcohol swabs.    The left glenohumeral joint was injected using 3 cc of 1% lidocaine on a 22-gauge 3-1/2 inch needle.  The syringe was switched and a mixture containing 1 cc's of 40 mg Kenalog and 4 cc's of 0.25% Vivacaine was injected.  Ultrasound was used. Images were obtained in short views showing the injection.     A sterile dressing was applied.  [name] did tolerate procedure well.     ASSESSMENT & PLAN:   Primary osteoarthritis of left shoulder Acutely occurring.  Symptoms most consistent with irritation of the joint space.  -Counseled on home exercise therapy and supportive care. -injection today. -Count on compression. -  Could consider physical therapy or further imaging.

## 2022-01-14 NOTE — ED Provider Notes (Signed)
[name]    CSN: [id number] Arrival date & time: 01/14/22  0849      History   Chief Complaint Chief Complaint  Patient presents with   Shoulder Pain    left    HPI [name] [name] is a 71 y.o. female.   HPI pleasant 71 year old female presents with left shoulder pain and swelling for 1 week.  Reports recently having cortisone injection in her left knee and has been using her left lower/upper arm and left shoulder to push her up from seated position when she is moving about her house with activities of daily living (for the past 7 days).  PMH significant for obesity, T2DM, and HTN.  Past Medical History:  Diagnosis Date   Allergy    Anemia    Arthritis    Asthma    as child only   Back pain    Diabetes mellitus without complication (HCC)    Hypertension    Hypothyroidism     Patient Active Problem List   Diagnosis Date Noted   Status post left hip replacement 01/10/2021   Vitamin D deficiency 06/27/2020   Peripheral sensory neuropathy due to type 2 diabetes mellitus (HCC) 12/01/2019   Mild intermittent asthma 12/01/2019   Myositis 12/01/2019   Unilateral primary osteoarthritis, left hip 03/06/2019   Chronic low back pain 01/18/2019   Status post total replacement of right hip 11/18/2018   Unilateral primary osteoarthritis, right hip 09/29/2018   Bilateral primary osteoarthritis of hip 05/25/2018   Spinal stenosis of lumbar region with neurogenic claudication 04/20/2018   Impingement syndrome of right shoulder 04/20/2018   Type II diabetes mellitus, uncontrolled 11/05/2017   Mixed hyperlipidemia 06/04/2017   Acquired hypothyroidism 03/05/2017   Normocytic anemia 01/24/2017   Bilateral carpal tunnel syndrome 05/06/2016   Essential hypertension, benign 02/20/2016   Primary osteoarthritis of left knee 05/24/2014   Elevated blood pressure reading 09/29/2011   Chronic back pain 09/29/2011   Leg edema 09/29/2011   Obesity with body mass index 30 or  greater 09/29/2011    Past Surgical History:  Procedure Laterality Date   ABDOMINAL HYSTERECTOMY  1989   fibroid-no longer needs pap   BACK SURGERY  1987, 1985   DDD   CARPAL TUNNEL RELEASE Left 05/28/2016   Procedure: CARPAL TUNNEL RELEASE;  Surgeon: Cindee Salt, MD;  Location: [facility];  Service: Orthopedics;  Laterality: Left;   CARPAL TUNNEL RELEASE Right 03/17/2018   Procedure: CARPAL TUNNEL RELEASE;  Surgeon: Cindee Salt, MD;  Location: [facility];  Service: Orthopedics;  Laterality: Right;   CARPAL TUNNEL RELEASE Left    TOTAL HIP ARTHROPLASTY Right 11/18/2018   Procedure: RIGHT TOTAL HIP ARTHROPLASTY ANTERIOR APPROACH;  Surgeon: Kathryne Hitch, MD;  Location[facility];  Service: Orthopedics;  Laterality: Right;   TOTAL HIP ARTHROPLASTY Left 01/10/2021   Procedure: LEFT TOTAL HIP ARTHROPLASTY ANTERIOR APPROACH;  Surgeon: Kathryne Hitch, MD;  Location[facility];  Service: Orthopedics;  Laterality: Left;   TUBAL LIGATION      OB History   No obstetric history on file.      Home Medications    Prior to Admission medications   Medication Sig Start Date End Date Taking? Authorizing Provider  Calcium Carb-Cholecalciferol (CALTRATE 600+D3) 600-20 MG-MCG TABS Take 1 tablet every day by oral route for 90 days. 07/04/20  Yes [provider]  diclofenac Sodium (VOLTAREN) 1 % GEL APPLY 4 GRAMS TO THE AFFECTED AREA(S) BY TOPICAL ROUTE  4 TIMES PER DAY as needed 01/05/22  Yes [provider]  methocarbamol (ROBAXIN) 500 MG tablet Take 1 tablet (500 mg total) by mouth 3 (three) times daily as needed. 01/14/22  Yes Trevor Iha, FNP  rosuvastatin (CRESTOR) 20 MG tablet Take 1 tablet by mouth daily. 11/20/21  Yes [provider]  albuterol (VENTOLIN HFA) 108 (90 Base) MCG/ACT inhaler Inhale 2 puffs into the lungs every 4 (four) hours as needed.    [provider]  amLODipine (NORVASC) 10 MG tablet Take 10 mg by  mouth daily. Patient not taking: Reported on 01/14/2022 04/10/20   [provider]  diclofenac (VOLTAREN) 75 MG EC tablet TAKE ONE TABLET TWICE DAILY Patient not taking: Reported on 01/14/2022 11/20/21   Kirtland Bouchard, PA-C  diphenhydrAMINE (BENADRYL) 25 MG tablet Take 0.5 tablets (12.5 mg total) by mouth at bedtime as needed (cramping). 09/30/19   Jacalyn Lefevre, MD  fluticasone (FLONASE) 50 MCG/ACT nasal spray 1 SPRAY TO EACH NOSTRIL EVERY DAY    [provider]  furosemide (LASIX) 20 MG tablet Take 20 mg by mouth daily as needed. 12/16/21   [provider]  glimepiride (AMARYL) 1 MG tablet Take 0.5 mg by mouth daily with breakfast.    [provider]  hydrochlorothiazide (HYDRODIURIL) 12.5 MG tablet Take 12.5 mg by mouth daily. 12/31/21   [provider]  icosapent Ethyl (VASCEPA) 1 g capsule Take 2 g by mouth 2 (two) times daily. 05/16/20   [provider]  ketoconazole (NIZORAL) 2 % cream Apply 1 application topically 2 (two) times daily. 12/24/20   [provider]  levothyroxine (SYNTHROID) 75 MCG tablet Take 75 mcg by mouth daily before breakfast.    [provider]  Multiple Vitamins-Minerals (CERTAVITE SENIOR/ANTIOXIDANT) TABS Take 1 tablet by mouth daily.    [provider]  potassium chloride (KLOR-CON) 10 MEQ tablet Take 10 mEq by mouth daily as needed. 12/16/21   [provider]  pregabalin (LYRICA) 50 MG capsule Take 50 mg by mouth at bedtime. Patient not taking: Reported on 01/14/2022    [provider]  traMADol (ULTRAM) 50 MG tablet Take 1 tablet (50 mg total) by mouth every 6 (six) hours as needed. 03/05/21   Kirtland Bouchard, PA-C  atorvastatin (LIPITOR) 20 MG tablet Take 20 mg by mouth daily.  07/15/17 09/30/19  [provider]    Family History Family History  Problem Relation Age of Onset   Asthma Mother    Cancer Mother        AML   Leukemia Mother    Heart disease  Father    Hypertension Father    Kidney disease Father    Heart attack Brother    Breast cancer Maternal Aunt    Multiple sclerosis Maternal Aunt     Social History Social History   Tobacco Use   Smoking status: Never   Smokeless tobacco: Never  Vaping Use   Vaping Use: Never used  Substance Use Topics   Alcohol use: Yes    Comment: social   Drug use: No     Allergies   Gabapentin, Celecoxib, Metformin, and Flagyl [metronidazole]   Review of Systems Review of Systems  Musculoskeletal:        Left shoulder pain and swelling for 1 week     Physical Exam Triage Vital Signs ED Triage Vitals  Enc Vitals Group     BP 01/14/22 0901 (!) 173/89     Pulse Rate 01/14/22  0901 79     Resp 01/14/22 0901 16     Temp 01/14/22 0901 98.8 F (37.1 C)     Temp Source 01/14/22 0901 Oral     SpO2 01/14/22 0901 98 %     Weight 01/14/22 0904 212 lb (96.2 kg)     Height 01/14/22 0904 5' 7.5" (1.715 m)     Head Circumference --      Peak Flow --      Pain Score 01/14/22 0903 4     Pain Loc --      Pain Edu? --      Excl. in [place]? --    No data found.  Updated Vital Signs BP (!) 173/89 (BP Location: Right Arm) Comment: has not taken BP meds today  Pulse 79   Temp 98.8 F (37.1 C) (Oral)   Resp 16   Ht 5' 7.5" (1.715 m)   Wt 212 lb (96.2 kg)   SpO2 98%   BMI 32.71 kg/m      Physical Exam Vitals and nursing note reviewed.  Constitutional:      General: She is not in acute distress.    Appearance: She is obese. She is not ill-appearing.  HENT:     Head: Normocephalic and atraumatic.     Mouth/Throat:     Mouth: Mucous membranes are moist.     Pharynx: Oropharynx is clear.  Eyes:     Conjunctiva/sclera: Conjunctivae normal.     Pupils: Pupils are equal, round, and reactive to light.  Cardiovascular:     Rate and Rhythm: Normal rate and regular rhythm.     Pulses: Normal pulses.     Heart sounds: Normal heart sounds.  Pulmonary:     Effort: Pulmonary effort is  normal.     Breath sounds: Normal breath sounds. No wheezing, rhonchi or rales.  Musculoskeletal:        General: Normal range of motion.     Cervical back: Normal range of motion and neck supple.     Comments: Left shoulder: Moderate soft tissue swelling noted over before meals and GH joint, limited range of motion due to pain, limited exam today-patient unable to minimally abduct left shoulder  Skin:    General: Skin is warm and dry.  Neurological:     General: No focal deficit present.     Mental Status: She is oriented to person, place, and time.      UC Treatments / Results  Labs (all labs ordered are listed, but only abnormal results are displayed) Labs Reviewed - No data to display  EKG   Radiology DG Shoulder Left  Result Date: 01/14/2022 CLINICAL DATA:  Left shoulder pain and swelling for 1 week. EXAM: LEFT SHOULDER - 2+ VIEW COMPARISON:  Left shoulder radiographs 08/08/2021 FINDINGS: Mild-to-moderate glenohumeral joint space narrowing and peripheral glenoid and inferior humeral head-neck junction degenerative osteophytosis. Mild-to-moderate acromioclavicular joint space narrowing and peripheral osteophytosis. No acute fracture or dislocation. Moderate atherosclerotic calcifications within the aortic arch. IMPRESSION: Mild-to-moderate glenohumeral and acromioclavicular osteoarthritis. Electronically Signed   By: Yvonne Kendall M.D.   On: 01/14/2022 09:38    Procedures Procedures (including critical care time)  Medications Ordered in UC Medications - No data to display  Initial Impression / Assessment and Plan / UC Course  I have reviewed the triage vital signs and the nursing notes.  Pertinent labs & imaging results that were available during my care of the patient were reviewed by me and considered in  my medical decision making (see chart for details).    MDM: 1.  Acute pain of left shoulder-left shoulder x-ray revealed above.  Rx'd Robaxin. Advised/informed patient  of left shoulder x-ray results with hard copy provided to patient.  Advised patient to RICE left shoulder for 30 minutes 3 times daily for the next 3 days.  Advised may use Robaxin daily or as needed for left shoulder discomfort.  Advised if symptoms worsen and/or unresolved please follow-up with [facility] orthopedic provider for further evaluation (contact information is below).  Encouraged patient to increase daily water intake while taking this medication.  Patient discharged home, hemodynamically stable. Final Clinical Impressions(s) / UC Diagnoses   Final diagnoses:  Acute pain of left shoulder     Discharge Instructions      Advised/informed patient of left shoulder x-ray results with hard copy provided to patient.  Advised patient to RICE left shoulder for 30 minutes 3 times daily for the next 3 days.  Advised may use Robaxin daily or as needed for left shoulder discomfort.  Advised if symptoms worsen and/or unresolved please follow-up with [facility] orthopedic provider for further evaluation (contact information is below).  Encouraged patient to increase daily water intake while taking this medication.     ED Prescriptions     Medication Sig Dispense Auth. Provider   methocarbamol (ROBAXIN) 500 MG tablet Take 1 tablet (500 mg total) by mouth 3 (three) times daily as needed. 30 tablet Trevor Iha, FNP      PDMP not reviewed this encounter.   Trevor Iha, FNP 01/14/22 1012

## 2022-01-15 ENCOUNTER — Telehealth: Payer: Self-pay | Admitting: Emergency Medicine

## 2022-01-24 ENCOUNTER — Other Ambulatory Visit: Payer: Self-pay | Admitting: Physician Assistant

## 2022-02-05 ENCOUNTER — Ambulatory Visit (HOSPITAL_BASED_OUTPATIENT_CLINIC_OR_DEPARTMENT_OTHER): Payer: 59 | Admitting: Obstetrics & Gynecology

## 2022-02-05 ENCOUNTER — Ambulatory Visit (INDEPENDENT_AMBULATORY_CARE_PROVIDER_SITE_OTHER): Payer: 59 | Admitting: Family Medicine

## 2022-02-05 VITALS — BP 153/85 | Ht 68.5 in | Wt 205.0 lb

## 2022-02-05 DIAGNOSIS — M48062 Spinal stenosis, lumbar region with neurogenic claudication: Secondary | ICD-10-CM | POA: Diagnosis not present

## 2022-02-05 MED ORDER — TRIAMCINOLONE ACETONIDE 40 MG/ML IJ SUSP: 40.0000 mg | Freq: Once | INTRAMUSCULAR | Status: DC

## 2022-02-05 NOTE — Progress Notes (Signed)
  SUBJECTIVE:   CHIEF COMPLAINT / HPI:   71 y/o F presenting for low back pain.  She notes that she had surgery in 1985, 1987, and progressively worsened until she needed to be on disability in 1991.  This progressed from an injury she had in 1995 at work carrying something.  She was originally told in 1991 by her surgeon that she should not have any further surgeries.  Notes that she saw Dr. Lorin Mercy this year who advised her to have surgery.  She presents today for discussion of nonsurgical measures.  Notes that her low back pain is worse in the last 3 months.  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  Denies changes in bowel habits, fever, shooting pain down her lower extremities.  Her pain does sometimes wake her up at night when she turns but she is able to walk about 5 miles a day.  She has only tried cold/hot pads for her low back.  Denies any recent injuries.  Pain worse with extension.  Upon chart review, note by Dr. Lorin Mercy on 09/30/2021 notes lumbar radiculopathy and patient does have severe stenosis at L3-L4 with MRI showing moderate to severe stenosis at L4-L5.  Her surgeries were at L5-S1.  PERTINENT PMH / PSH: Bilateral hip OA, spinal stenosis of lumbar region  OBJECTIVE:  BP (!) 153/85 (BP Location: Right Arm, Patient Position: Sitting)   Ht 5' 8.5" (1.74 m)   Wt 205 lb (93 kg)   BMI 30.72 kg/m   Back: No gross deformity, scoliosis. TTP L3-4 paraspinal regions.  No midline or bony TTP. Limited extension with worsening of pain.  Decreased bilateral trunk rotation.  Flexion to 60 degrees. Strength LEs 5/5 all muscle groups.   Negative SLRs. Sensation intact to light touch bilaterally. Negative logroll bilateral hips.  ASSESSMENT/PLAN:  Spinal stenosis of lumbar region with neurogenic claudication Assessment & Plan: Attempting to hold off on  surgery per patient preference.  -Conservative pain management, kidney function normal -PT flexion exercises -IR referral for L3/L4 ESI   Wells Guiles, DO 02/05/2022, 10:22 AM PGY-2, [place]

## 2022-02-05 NOTE — Assessment & Plan Note (Addendum)
Attempting to hold off on surgery per patient preference.  -Conservative pain management, kidney function normal -PT flexion exercises -IR referral for L3/L4 ESI

## 2022-02-05 NOTE — Patient Instructions (Signed)
Your pain is due to spinal stenosis. These are the different medications you can take for this: Tylenol 500mg  1-2 tabs three times a day for pain. Capsaicin, aspercreme, or biofreeze topically up to four times a day may also help with pain. Some supplements that may help for arthritis: Boswellia extract, curcumin, pycnogenol Aleve 1-2 tabs twice a day with food rarely if needed for pain and inflammation. Cortisone injections are an option - we will put in a referral for one of these for you. It's important that you continue to stay active. Start physical therapy and do home exercises on days you don't go to therapy. Heat or ice 15 minutes at a time 3-4 times a day as needed to help with pain. Let me know how you're doing a week after the injection.

## 2022-02-06 ENCOUNTER — Encounter: Payer: Self-pay | Admitting: Family Medicine

## 2022-02-16 ENCOUNTER — Ambulatory Visit: Payer: 59 | Admitting: Family Medicine

## 2022-02-16 ENCOUNTER — Other Ambulatory Visit: Payer: Self-pay | Admitting: Family Medicine

## 2022-02-16 DIAGNOSIS — M48062 Spinal stenosis, lumbar region with neurogenic claudication: Secondary | ICD-10-CM

## 2022-02-19 ENCOUNTER — Ambulatory Visit: Payer: 59 | Admitting: Family Medicine

## 2022-02-19 NOTE — Therapy (Incomplete)
OUTPATIENT PHYSICAL THERAPY THORACOLUMBAR EVALUATION   Patient Name: [name] [name] MRN: [id number] DOB:02/01/1951, 71 y.o., female Today's Date: 02/19/2022  END OF SESSION:   Past Medical History:  Diagnosis Date   Allergy    Anemia    Arthritis    Asthma    as child only   Back pain    Diabetes mellitus without complication (HCC)    Hypertension    Hypothyroidism    Past Surgical History:  Procedure Laterality Date   ABDOMINAL HYSTERECTOMY  1989   fibroid-no longer needs pap   BACK SURGERY  1987, 1985   DDD   CARPAL TUNNEL RELEASE Left 05/28/2016   Procedure: CARPAL TUNNEL RELEASE;  Surgeon: Cindee Salt, MD;  Location: [facility];  Service: Orthopedics;  Laterality: Left;   CARPAL TUNNEL RELEASE Right 03/17/2018   Procedure: CARPAL TUNNEL RELEASE;  Surgeon: Cindee Salt, MD;  Location: [facility];  Service: Orthopedics;  Laterality: Right;   CARPAL TUNNEL RELEASE Left    TOTAL HIP ARTHROPLASTY Right 11/18/2018   Procedure: RIGHT TOTAL HIP ARTHROPLASTY ANTERIOR APPROACH;  Surgeon: Kathryne Hitch, MD;  Location: [facility];  Service: Orthopedics;  Laterality: Right;   TOTAL HIP ARTHROPLASTY Left 01/10/2021   Procedure: LEFT TOTAL HIP ARTHROPLASTY ANTERIOR APPROACH;  Surgeon: Kathryne Hitch, MD;  Location: [facility];  Service: Orthopedics;  Laterality: Left;   TUBAL LIGATION     Patient Active Problem List   Diagnosis Date Noted   Primary osteoarthritis of left shoulder 01/14/2022   Status post left hip replacement 01/10/2021   Vitamin D deficiency 06/27/2020   Peripheral sensory neuropathy due to type 2 diabetes mellitus (HCC) 12/01/2019   Mild intermittent asthma 12/01/2019   Myositis 12/01/2019   Unilateral primary osteoarthritis, left hip 03/06/2019   Chronic low back pain 01/18/2019   Status post total replacement of right hip 11/18/2018   Unilateral primary osteoarthritis, right hip 09/29/2018   Bilateral primary  osteoarthritis of hip 05/25/2018   Spinal stenosis of lumbar region with neurogenic claudication 04/20/2018   Impingement syndrome of right shoulder 04/20/2018   Type II diabetes mellitus, uncontrolled 11/05/2017   Mixed hyperlipidemia 06/04/2017   Acquired hypothyroidism 03/05/2017   Normocytic anemia 01/24/2017   Bilateral carpal tunnel syndrome 05/06/2016   Essential hypertension, benign 02/20/2016   Primary osteoarthritis of left knee 05/24/2014   Elevated blood pressure reading 09/29/2011   Chronic back pain 09/29/2011   Leg edema 09/29/2011   Obesity with body mass index 30 or greater 09/29/2011    PCP: Jamison Oka, MD  REFERRING PROVIDER: Norton Blizzard, MD  REFERRING DIAG: [phone] (ICD-10-CM) - Spinal stenosis of lumbar region with neurogenic claudication   Rationale for Evaluation and Treatment: Rehabilitation  THERAPY DIAG:  No diagnosis found.  ONSET DATE: ***  SUBJECTIVE:  SUBJECTIVE STATEMENT: ***  PERTINENT HISTORY:  ***  PAIN:  Are you having pain? Yes: NPRS scale: ***/10 Pain location: *** Pain description: *** Aggravating factors: *** Relieving factors: ***  PRECAUTIONS: None  WEIGHT BEARING RESTRICTIONS: No  FALLS:  Has patient fallen in last 6 months? {fallsyesno:27318}  LIVING ENVIRONMENT: Lives with: {[facility] lives with:25569::"lives with their family"} Lives in: {Lives in:25570} Stairs: {opstairs:27293} Has following equipment at home: None  OCCUPATION: ***  PLOF: Independent and Leisure: ***  PATIENT GOALS: ***  NEXT MD VISIT:   OBJECTIVE:   DIAGNOSTIC FINDINGS:  ***  PATIENT SURVEYS:  Modified Oswestry ***   SCREENING FOR RED FLAGS: Bowel or bladder incontinence: {Yes/No:304960894} Spinal tumors: {Yes/No:304960894} Cauda equina  syndrome: {Yes/No:304960894} Compression fracture: {Yes/No:304960894} Abdominal aneurysm: {Yes/No:304960894}  COGNITION: Overall cognitive status: Within functional limits for tasks assessed     SENSATION: {sensation:27233}  MUSCLE LENGTH: Hamstrings: Right *** deg; Left *** deg Thomas test: Right *** deg; Left *** deg  POSTURE: {posture:25561}  PALPATION: ***  LUMBAR ROM:   AROM eval  Flexion   Extension   Right lateral flexion   Left lateral flexion   Right rotation   Left rotation    (Blank rows = not tested)  LOWER EXTREMITY ROM:     {AROM/PROM:27142}  Right eval Left eval  Hip flexion    Hip extension    Hip abduction    Hip adduction    Hip internal rotation    Hip external rotation    Knee flexion    Knee extension    Ankle dorsiflexion    Ankle plantarflexion    Ankle inversion    Ankle eversion     (Blank rows = not tested)  LOWER EXTREMITY MMT:    MMT Right eval Left eval  Hip flexion    Hip extension    Hip abduction    Hip adduction    Hip internal rotation    Hip external rotation    Knee flexion    Knee extension    Ankle dorsiflexion    Ankle plantarflexion    Ankle inversion    Ankle eversion     (Blank rows = not tested)  LUMBAR SPECIAL TESTS:  {lumbar special test:25242}  FUNCTIONAL TESTS:  5x sit to stand:   GAIT: Distance walked: *** Assistive device utilized: {Assistive devices:23999} Level of assistance: {Levels of assistance:24026} Comments: ***  TODAY'S TREATMENT:                                                                     DATE: 02/23/22    PATIENT EDUCATION:  Education details: *** Person educated: Patient Education method: Programmer, multimedia, Facilities manager, and Handouts Education comprehension: verbalized understanding and returned demonstration  HOME EXERCISE PROGRAM: ***  ASSESSMENT:  CLINICAL IMPRESSION: Patient is a 71 y.o. female who was seen today for physical therapy evaluation and  treatment for LBP.   OBJECTIVE IMPAIRMENTS: {opptimpairments:25111}.   ACTIVITY LIMITATIONS: {activitylimitations:27494}  PARTICIPATION LIMITATIONS: {participationrestrictions:25113}  PERSONAL FACTORS: {Personal factors:25162} are also affecting patient's functional outcome.   REHAB POTENTIAL: {rehabpotential:25112}  CLINICAL DECISION MAKING: {clinical decision making:25114}  EVALUATION COMPLEXITY: {Evaluation complexity:25115}   GOALS: Goals reviewed with patient? Yes  SHORT TERM GOALS: Target date: ***  Be independent in initial HEP Baseline:  Goal status: INITIAL  2.  *** Baseline:  Goal status: {GOALSTATUS:25110}  3.  *** Baseline:  Goal status: {GOALSTATUS:25110}  4.  *** Baseline:  Goal status: {GOALSTATUS:25110}  5.  *** Baseline:  Goal status: {GOALSTATUS:25110}  6.  *** Baseline:  Goal status: {GOALSTATUS:25110}  LONG TERM GOALS: Target date: ***  Be independent in advanced HEP Baseline:  Goal status: INITIAL  2.  *** Baseline:  Goal status: {GOALSTATUS:25110}  3.  *** Baseline:  Goal status: {GOALSTATUS:25110}  4.  *** Baseline:  Goal status: {GOALSTATUS:25110}  5.  *** Baseline:  Goal status: {GOALSTATUS:25110}  6.  *** Baseline:  Goal status: {GOALSTATUS:25110}  PLAN:  PT FREQUENCY: 2x/week  PT DURATION: 8 weeks  PLANNED INTERVENTIONS: Therapeutic exercises, Therapeutic activity, Neuromuscular re-education, Balance training, Gait training, Patient/Family education, Self Care, Joint mobilization, Aquatic Therapy, Dry Needling, Spinal manipulation, Spinal mobilization, Cryotherapy, Moist heat, Taping, Manual therapy, and Re-evaluation.  PLAN FOR NEXT SESSION: ***   Lorrene Reid, PT 02/19/22 12:17 PM   [facility] [address], [address] [place], [place] [address] Phone # [phone] Fax [phone]

## 2022-02-21 ENCOUNTER — Ambulatory Visit (INDEPENDENT_AMBULATORY_CARE_PROVIDER_SITE_OTHER): Payer: 59

## 2022-02-21 ENCOUNTER — Other Ambulatory Visit: Payer: Self-pay

## 2022-02-21 ENCOUNTER — Ambulatory Visit
Admission: EM | Admit: 2022-02-21 | Discharge: 2022-02-21 | Disposition: A | Discharge status: Home / Self Care | Payer: 59 | Attending: Family Medicine | Admitting: Family Medicine

## 2022-02-21 DIAGNOSIS — M25562 Pain in left knee: Secondary | ICD-10-CM

## 2022-02-21 MED ORDER — OXYCODONE-ACETAMINOPHEN 5-325 MG PO TABS: 1.0000 | ORAL_TABLET | Freq: Three times a day (TID) | ORAL | 0 refills | Status: DC | PRN

## 2022-02-21 NOTE — Discharge Instructions (Addendum)
Advised patient of left knee x-ray results with hard copy provided.  Advised patient may use Percocet as needed for left knee pain.  Advised patient to follow-up with [place] orthopedic provider Daphene Jaeger, MD) if left knee pain worsens and/or unresolved.

## 2022-02-21 NOTE — ED Triage Notes (Signed)
Pt presents to Urgent Care with c/o L knee pain and swelling since yesterday, no known injury. Has had issues w/ this knee in the past--arthritis.

## 2022-02-21 NOTE — ED Provider Notes (Signed)
[name]    CSN: [id number] Arrival date & time: 02/21/22  1016      History   Chief Complaint Chief Complaint  [name] [name] presents with   Knee Pain    HPI [name] [name] is a 71 y.o. [name].   HPI [name] 71 year old [name] presents with left knee pain and swelling since yesterday.  Reports no known injury.  Has had issues with his left knee in the past with arthritis.  PMH significant for obesity, HTN, and primary osteoarthritis of left knee.  Reports taking tramadol yesterday with little to no relief.  Past Medical History:  Diagnosis Date   Allergy    Anemia    Arthritis    Asthma    as child only   Back pain    Diabetes mellitus without complication ([place])    Hypertension    Hypothyroidism     [name] [name] Active Problem List   Diagnosis Date Noted   Primary osteoarthritis of left shoulder 01/14/2022   Status post left hip replacement 01/10/2021   Vitamin D deficiency 06/27/2020   Peripheral sensory neuropathy due to type 2 diabetes mellitus ([place]) 12/01/2019   Mild intermittent asthma 12/01/2019   Myositis 12/01/2019   Unilateral primary osteoarthritis, left hip 03/06/2019   Chronic low back pain 01/18/2019   Status post total replacement of right hip 11/18/2018   Unilateral primary osteoarthritis, right hip 09/29/2018   Bilateral primary osteoarthritis of hip 05/25/2018   Spinal stenosis of lumbar region with neurogenic claudication 04/20/2018   Impingement syndrome of right shoulder 04/20/2018   Type II diabetes mellitus, uncontrolled 11/05/2017   Mixed hyperlipidemia 06/04/2017   Acquired hypothyroidism 03/05/2017   Normocytic anemia 01/24/2017   Bilateral carpal tunnel syndrome 05/06/2016   Essential hypertension, benign 02/20/2016   Primary osteoarthritis of left knee 05/24/2014   Elevated blood pressure reading 09/29/2011   Chronic back pain 09/29/2011   Leg edema 09/29/2011   Obesity with body mass index 30 or greater 09/29/2011     Past Surgical History:  Procedure Laterality Date   ABDOMINAL HYSTERECTOMY  1989   fibroid-no longer needs pap   [place]   DDD   CARPAL TUNNEL RELEASE Left 05/28/2016   Procedure: CARPAL TUNNEL RELEASE;  Surgeon: Daryll Brod, MD;  Location: [facility];  Service: Orthopedics;  Laterality: Left;   CARPAL TUNNEL RELEASE Right 03/17/2018   Procedure: CARPAL TUNNEL RELEASE;  Surgeon: Daryll Brod, MD;  Location: [facility];  Service: Orthopedics;  Laterality: Right;   CARPAL TUNNEL RELEASE Left    TOTAL HIP ARTHROPLASTY Right 11/18/2018   Procedure: RIGHT TOTAL HIP ARTHROPLASTY ANTERIOR APPROACH;  Surgeon: Mcarthur Rossetti, MD;  Location: [facility];  Service: Orthopedics;  Laterality: Right;   TOTAL HIP ARTHROPLASTY Left 01/10/2021   Procedure: LEFT TOTAL HIP ARTHROPLASTY ANTERIOR APPROACH;  Surgeon: Mcarthur Rossetti, MD;  Location: [facility];  Service: Orthopedics;  Laterality: Left;   TUBAL LIGATION      OB History   No obstetric history on file.      Home Medications    Prior to Admission medications   Medication Sig Start Date End Date Taking? Authorizing Provider  oxyCODONE-acetaminophen (PERCOCET/ROXICET) 5-325 MG tablet Take 1 tablet by mouth every 8 (eight) hours as needed for severe pain. 02/21/22  Yes Eliezer Lofts, FNP  albuterol (VENTOLIN HFA) 108 (90 Base) MCG/ACT inhaler Inhale 2 puffs into the lungs every 4 (four) hours as needed.    [provider]  amLODipine (NORVASC) 10 MG tablet Take 10 mg by mouth daily. 04/10/20   [provider]  Calcium Carb-Cholecalciferol (CALTRATE 600+D3) 600-20 MG-MCG TABS Take 1 tablet every day by oral route for 90 days. 07/04/20   [provider]  diclofenac (VOLTAREN) 75 MG EC tablet TAKE ONE TABLET TWICE DAILY 01/26/22   Pete Pelt, PA-C  diclofenac Sodium (VOLTAREN) 1 % GEL APPLY 4 GRAMS TO THE AFFECTED AREA(S) BY TOPICAL ROUTE 4 TIMES PER DAY as needed  01/05/22   [provider]  diphenhydrAMINE (BENADRYL) 25 MG tablet Take 0.5 tablets (12.5 mg total) by mouth at bedtime as needed (cramping). 09/30/19   Isla Pence, MD  fluticasone (FLONASE) 50 MCG/ACT nasal spray 1 SPRAY TO EACH NOSTRIL EVERY DAY    [provider]  furosemide (LASIX) 20 MG tablet Take 20 mg by mouth daily as needed. 12/16/21   [provider]  glimepiride (AMARYL) 1 MG tablet Take 0.5 mg by mouth daily with breakfast.    [provider]  hydrochlorothiazide (HYDRODIURIL) 12.5 MG tablet Take 12.5 mg by mouth daily. 12/31/21   [provider]  icosapent Ethyl (VASCEPA) 1 g capsule Take 2 g by mouth 2 (two) times daily. 05/16/20   [provider]  ketoconazole (NIZORAL) 2 % cream Apply 1 application topically 2 (two) times daily. 12/24/20   [provider]  levothyroxine (SYNTHROID) 75 MCG tablet Take 75 mcg by mouth daily before breakfast.    [provider]  methocarbamol (ROBAXIN) 500 MG tablet Take 1 tablet (500 mg total) by mouth 3 (three) times daily as needed. 01/14/22   Eliezer Lofts, FNP  Multiple Vitamins-Minerals (CERTAVITE SENIOR/ANTIOXIDANT) TABS Take 1 tablet by mouth daily.    [provider]  potassium chloride (KLOR-CON) 10 MEQ tablet Take 10 mEq by mouth daily as needed. 12/16/21   [provider]  rosuvastatin (CRESTOR) 20 MG tablet Take 1 tablet by mouth daily. 11/20/21   [provider]  traMADol (ULTRAM) 50 MG tablet Take 1 tablet (50 mg total) by mouth every 6 (six) hours as needed. 03/05/21   Pete Pelt, PA-C  atorvastatin (LIPITOR) 20 MG tablet Take 20 mg by mouth daily.  07/15/17 09/30/19  [provider]    Family History Family History  Problem Relation Age of Onset   Asthma Mother    Cancer Mother        AML   Leukemia Mother    Heart disease Father    Hypertension Father    Kidney disease Father    Heart attack Brother    Breast  cancer Maternal Aunt    Multiple sclerosis Maternal Aunt     Social History Social History   Tobacco Use   Smoking status: Never   Smokeless tobacco: Never  Vaping Use   Vaping Use: Never used  Substance Use Topics   Alcohol use: Yes    Comment: social   Drug use: No     Allergies   Gabapentin, Celecoxib, Metformin, and Flagyl [metronidazole]   Review of Systems Review of Systems  Musculoskeletal:        Left knee pain  All other systems reviewed and are negative.    Physical Exam Triage Vital Signs ED Triage Vitals  Enc Vitals Group     BP --      Pulse --      Resp --      Temp --      Temp src --  SpO2 --      Weight 02/21/22 1029 208 lb (94.3 kg)     Height 02/21/22 1029 5' 8.5" (1.74 m)     Head Circumference --      Peak Flow --      Pain Score 02/21/22 1028 8     Pain Loc --      Pain Edu? --      Excl. in GC? --    No data found.  Updated Vital Signs BP 132/80 (BP Location: Left Arm)   Pulse 90   Temp 98.6 F (37 C) (Oral)   Resp 18   Ht 5' 8.5" (1.74 m)   Wt 208 lb (94.3 kg)   SpO2 95%   BMI 31.17 kg/m   Physical Exam Vitals and nursing note reviewed.  Constitutional:      General: [name] [name] is not in acute distress.    Appearance: Normal appearance. [name] [name] is obese. [name] [name] is not ill-appearing.  HENT:     Head: Normocephalic and atraumatic.     Mouth/Throat:     Mouth: Mucous membranes are moist.     Pharynx: Oropharynx is clear.  Eyes:     Extraocular Movements: Extraocular movements intact.     Conjunctiva/sclera: Conjunctivae normal.     Pupils: Pupils are equal, round, and reactive to light.  Cardiovascular:     Rate and Rhythm: Normal rate and regular rhythm.     Pulses: Normal pulses.     Heart sounds: Normal heart sounds.  Pulmonary:     Effort: Pulmonary effort is normal.     Breath sounds: Normal breath sounds. No wheezing, rhonchi or rales.  Musculoskeletal:        General: Normal range of motion.     Cervical back:  Normal range of motion and neck supple.     Comments: Left knee: TTP over inferior medial aspect, exam limited due to pain today  Skin:    General: Skin is warm and dry.  Neurological:     General: No focal deficit present.     Mental Status: [name] [name] is alert and oriented to person, place, and time. Mental status is at baseline.     Gait: Gait abnormal.      UC Treatments / Results  Labs (all labs ordered are listed, but only abnormal results are displayed) Labs Reviewed - No data to display  EKG   Radiology DG Knee Complete 4 Views Left  Result Date: 02/21/2022 CLINICAL DATA:  Left knee pain for 3 weeks. EXAM: LEFT KNEE - COMPLETE 4 VIEW COMPARISON:  08/08/2021 FINDINGS: No evidence of fracture, dislocation, or significant joint effusion. Degenerative marginal spurring with medial chondrocalcinosis. Tibiofemoral joint space narrowing greater at the lateral compartment. IMPRESSION: 1. No acute finding. 2. Osteoarthritis with tibiofemoral joint narrowing. Electronically Signed   By: Tiburcio Pea M.D.   On: 02/21/2022 11:02    Procedures Procedures (including critical care time)  Medications Ordered in UC Medications - No data to display  Initial Impression / Assessment and Plan / UC Course  I have reviewed the triage vital signs and the nursing notes.  Pertinent labs & imaging results that were available during my care of the [name] [name] were reviewed by me and considered in my medical decision making (see chart for details).     MDM: 1.  Left knee pain-left knee x-ray revealed above, previously prescribed tramadol ineffective per [name] [name] will try Percocet for the next several days until [name] [name] can follow-up with her orthopedic provider  for further evaluation. Advised [name] [name] of left knee x-ray results with hard copy provided.  Advised [name] [name] may use Percocet as needed for left knee pain.  Advised [name] [name] to follow-up with [facility] orthopedic provider Tia Alert, MD) if left knee  pain worsens and/or unresolved.  [name] [name] discharged home, hemodynamically stable. Final Clinical Impressions(s) / UC Diagnoses   Final diagnoses:  Left knee pain, unspecified chronicity     Discharge Instructions      Advised [name] [name] of left knee x-ray results with hard copy provided.  Advised [name] [name] may use Percocet as needed for left knee pain.  Advised [name] [name] to follow-up with [place] orthopedic provider Tia Alert, MD) if left knee pain worsens and/or unresolved.     ED Prescriptions     Medication Sig Dispense Auth. Provider   oxyCODONE-acetaminophen (PERCOCET/ROXICET) 5-325 MG tablet Take 1 tablet by mouth every 8 (eight) hours as needed for severe pain. 15 tablet Eliezer Lofts, FNP      I have reviewed the PDMP during this encounter.   Eliezer Lofts, [place] 02/21/22 1149

## 2022-02-23 ENCOUNTER — Ambulatory Visit: Payer: 59

## 2022-02-23 ENCOUNTER — Ambulatory Visit (INDEPENDENT_AMBULATORY_CARE_PROVIDER_SITE_OTHER): Payer: 59 | Admitting: Orthopaedic Surgery

## 2022-02-23 ENCOUNTER — Encounter: Payer: Self-pay | Admitting: Orthopaedic Surgery

## 2022-02-23 VITALS — BP 136/77 | HR 101 | Ht 68.5 in | Wt 208.0 lb

## 2022-02-23 DIAGNOSIS — M1712 Unilateral primary osteoarthritis, left knee: Secondary | ICD-10-CM

## 2022-02-23 MED ORDER — LIDOCAINE HCL 1 % IJ SOLN
0.5000 mL | INTRAMUSCULAR | Status: AC | PRN
  Administered 2022-02-23: .5 mL

## 2022-02-23 MED ORDER — METHYLPREDNISOLONE ACETATE 40 MG/ML IJ SUSP
40.0000 mg | INTRAMUSCULAR | Status: AC | PRN
  Administered 2022-02-23: 40 mg via INTRA_ARTICULAR

## 2022-02-23 MED ORDER — BUPIVACAINE HCL 0.5 % IJ SOLN
3.0000 mL | INTRAMUSCULAR | Status: AC | PRN
  Administered 2022-02-23: 3 mL via INTRA_ARTICULAR

## 2022-02-23 NOTE — Progress Notes (Signed)
Office Visit Note   Patient: [name] [name]           Date of Birth: 06-Aug-1950           MRN: [id number] Visit Date: 02/23/2022              Requested by: Harvest Forest, MD [address] [name],  [place] [address] PCP: Harvest Forest, MD   Assessment & Plan: Visit Diagnoses:  1. Unilateral primary osteoarthritis, left knee   2. Primary osteoarthritis of left knee     Plan: Patient has bone-on-bone changes knee arthritis medial and lateral with some chondrocalcinosis medial meniscus.  We again discussed total knee arthroplasty we will recheck her in 2 months.  She will talk with her great nieces to discuss trying to help with her care so she can avoid going to a skilled facility after total knee arthroplasty.  We discussed home therapy, outpatient therapy pain medication etc.  Hopefully the injection will give her some relief.  Follow-Up Instructions: Return in about 2 months (around 04/25/2022).   Orders:  Orders Placed This Encounter  Procedures   Large Joint Inj: L knee   No orders of the defined types were placed in this encounter.     Procedures: Large Joint Inj: L knee on 02/23/2022 10:17 AM Indications: joint swelling and pain Details: 22 G 1.5 in needle, anterolateral approach  Arthrogram: No  Medications: 0.5 mL lidocaine 1 %; 3 mL bupivacaine 0.5 %; 40 mg methylPREDNISolone acetate 40 MG/ML Outcome: tolerated well, no immediate complications Procedure, treatment alternatives, risks and benefits explained, specific risks discussed. Consent was given by the patient. Immediately prior to procedure a time out was called to verify the correct patient, procedure, equipment, support staff and site/side marked as required. Patient was prepped and draped in the usual sterile fashion.       Clinical Data: No additional findings.   Subjective: Chief Complaint  Patient presents with   Left Knee - Pain    HPI 71 year old female has had left  knee osteoarthritis for several years with several injections which in the past gave her relief.  She states on Friday her knee swelled up she had to go to urgent care could barely walk and was told that she had bone-on-bone changes with severe knee arthritis.  Previous x-rays that showed tricompartmental degenerative changes.  She has used ice heat had some Percocet.  She has had ambulate with a cane and states she can barely walk.  She has 2 great nieces that are closest family but both work.  She does have type 2 diabetes hypertension and we have discussed total knee arthroplasty in the past.  Review of Systems all systems noncontributory to HPI.   Objective: Vital Signs: BP 136/77   Pulse (!) 101   Ht 5' 8.5" (1.74 m)   Wt 208 lb (94.3 kg)   BMI 31.17 kg/m   Physical Exam Constitutional:      Appearance: She is well-developed.  HENT:     Head: Normocephalic.     Right Ear: External ear normal.     Left Ear: External ear normal. There is no impacted cerumen.  Eyes:     Pupils: Pupils are equal, round, and reactive to light.  Neck:     Thyroid: No thyromegaly.     Trachea: No tracheal deviation.  Cardiovascular:     Rate and Rhythm: Normal rate.  Pulmonary:     Effort: Pulmonary effort  is normal.  Abdominal:     Palpations: Abdomen is soft.  Musculoskeletal:     Cervical back: No rigidity.  Skin:    General: Skin is warm and dry.  Neurological:     Mental Status: She is alert and oriented to person, place, and time.  Psychiatric:        Behavior: Behavior normal.     Ortho Exam 2+ 3+ knee effusion crepitus knee range of motion she only has about 20 degrees range of motion.  Knee is not hot.  Specialty Comments:  No specialty comments available.  Imaging: arrative & Impression  CLINICAL DATA:  Left knee pain for 3 weeks.   EXAM: LEFT KNEE - COMPLETE 4 VIEW   COMPARISON:  08/08/2021   FINDINGS: No evidence of fracture, dislocation, or significant joint  effusion. Degenerative marginal spurring with medial chondrocalcinosis. Tibiofemoral joint space narrowing greater at the lateral compartment.   IMPRESSION: 1. No acute finding. 2. Osteoarthritis with tibiofemoral joint narrowing.     Electronically Signed   By: Tiburcio Pea M.D.   On: 02/21/2022 11:02     PMFS History: Patient Active Problem List   Diagnosis Date Noted   Primary osteoarthritis of left shoulder 01/14/2022   Status post left hip replacement 01/10/2021   Vitamin D deficiency 06/27/2020   Peripheral sensory neuropathy due to type 2 diabetes mellitus (HCC) 12/01/2019   Mild intermittent asthma 12/01/2019   Myositis 12/01/2019   Unilateral primary osteoarthritis, left hip 03/06/2019   Chronic low back pain 01/18/2019   Status post total replacement of right hip 11/18/2018   Unilateral primary osteoarthritis, right hip 09/29/2018   Bilateral primary osteoarthritis of hip 05/25/2018   Spinal stenosis of lumbar region with neurogenic claudication 04/20/2018   Impingement syndrome of right shoulder 04/20/2018   Type II diabetes mellitus, uncontrolled 11/05/2017   Mixed hyperlipidemia 06/04/2017   Acquired hypothyroidism 03/05/2017   Normocytic anemia 01/24/2017   Bilateral carpal tunnel syndrome 05/06/2016   Essential hypertension, benign 02/20/2016   Primary osteoarthritis of left knee 05/24/2014   Elevated blood pressure reading 09/29/2011   Chronic back pain 09/29/2011   Leg edema 09/29/2011   Obesity with body mass index 30 or greater 09/29/2011   Past Medical History:  Diagnosis Date   Allergy    Anemia    Arthritis    Asthma    as child only   Back pain    Diabetes mellitus without complication (HCC)    Hypertension    Hypothyroidism     Family History  Problem Relation Age of Onset   Asthma Mother    Cancer Mother        AML   Leukemia Mother    Heart disease Father    Hypertension Father    Kidney disease Father    Heart attack  Brother    Breast cancer Maternal Aunt    Multiple sclerosis Maternal Aunt     Past Surgical History:  Procedure Laterality Date   ABDOMINAL HYSTERECTOMY  1989   fibroid-no longer needs pap   BACK SURGERY  1987, 1985   DDD   CARPAL TUNNEL RELEASE Left 05/28/2016   Procedure: CARPAL TUNNEL RELEASE;  Surgeon: Cindee Salt, MD;  Location: [facility];  Service: Orthopedics;  Laterality: Left;   CARPAL TUNNEL RELEASE Right 03/17/2018   Procedure: CARPAL TUNNEL RELEASE;  Surgeon: Cindee Salt, MD;  Location: [facility];  Service: Orthopedics;  Laterality: Right;   CARPAL TUNNEL RELEASE Left  TOTAL HIP ARTHROPLASTY Right 11/18/2018   Procedure: RIGHT TOTAL HIP ARTHROPLASTY ANTERIOR APPROACH;  Surgeon: Kathryne Hitch, MD;  Location: [facility];  Service: Orthopedics;  Laterality: Right;   TOTAL HIP ARTHROPLASTY Left 01/10/2021   Procedure: LEFT TOTAL HIP ARTHROPLASTY ANTERIOR APPROACH;  Surgeon: Kathryne Hitch, MD;  Location: [facility];  Service: Orthopedics;  Laterality: Left;   TUBAL LIGATION     Social History   Occupational History   Not on file  Tobacco Use   Smoking status: Never   Smokeless tobacco: Never  Vaping Use   Vaping Use: Never used  Substance and Sexual Activity   Alcohol use: Yes    Comment: social   Drug use: No   Sexual activity: Not Currently

## 2022-02-25 ENCOUNTER — Inpatient Hospital Stay
Admission: RE | Admit: 2022-02-25 | Discharge: 2022-02-25 | Disposition: A | Discharge status: Home / Self Care | Payer: 59 | Source: Ambulatory Visit | Attending: Family Medicine | Admitting: Family Medicine

## 2022-02-25 NOTE — Discharge Instructions (Signed)

## 2022-03-03 ENCOUNTER — Ambulatory Visit: Payer: 59 | Admitting: Family Medicine

## 2022-04-28 ENCOUNTER — Ambulatory Visit (INDEPENDENT_AMBULATORY_CARE_PROVIDER_SITE_OTHER): Payer: 59 | Admitting: Orthopaedic Surgery

## 2022-04-28 ENCOUNTER — Encounter: Payer: Self-pay | Admitting: Orthopaedic Surgery

## 2022-04-28 VITALS — BP 136/79 | HR 80 | Ht 68.5 in | Wt 211.0 lb

## 2022-04-28 DIAGNOSIS — M1712 Unilateral primary osteoarthritis, left knee: Secondary | ICD-10-CM | POA: Diagnosis not present

## 2022-04-28 DIAGNOSIS — M5416 Radiculopathy, lumbar region: Secondary | ICD-10-CM | POA: Diagnosis not present

## 2022-04-28 DIAGNOSIS — M545 Low back pain, unspecified: Secondary | ICD-10-CM

## 2022-04-28 DIAGNOSIS — G8929 Other chronic pain: Secondary | ICD-10-CM | POA: Diagnosis not present

## 2022-04-28 NOTE — Progress Notes (Signed)
Office Visit Note   Patient: [name] [name]           Date of Birth: 1951/01/01           MRN: [id number] Visit Date: 04/28/2022              Requested by: Audley Hose, MD [place],  [place] [id number] PCP: Audley Hose, MD   Assessment & Plan: Visit Diagnoses:  1. Lumbar radiculopathy   2. Primary osteoarthritis of left knee   3. Chronic bilateral low back pain, unspecified whether sciatica present     Plan: Will proceed with lumbar epidural for stenosis which in past years of given her good relief previous x-rays show bone-on-bone changes left knee.  She wants to wait to the summer so that her nieces would be available postop.  I will recheck her in 2 months.  Follow-Up Instructions: Return in about 2 months (around 06/27/2022).   Orders:  Orders Placed This Encounter  Procedures   Ambulatory referral to Physical Medicine Rehab   No orders of the defined types were placed in this encounter.     Procedures: No procedures performed   Clinical Data: No additional findings.   Subjective: Chief Complaint  Patient presents with   Left Knee - Pain, Follow-up    HPI 72 year old female returns she had left knee injection 02/23/2022 and she states it is gave her good relief but in the last week or so her pain is returned.  She has had swelling some burning in the back of her knee and she has been ambulating with a cane.  She states her PCP ordered some Tylenol with codeine but it had not been delivered to her pharmacy.  She has had tramadol without relief.  She is requesting injection with Dr. Ernestina Patches lumbar spine which in the past had given her good relief.  Previous surgeries by Dr. Hardin Negus in mid and early 80s.  Plain radiograph showed some anterolisthesis at L3-4 and L4-5.  MRI 2019 showed some stenosis at both levels.  Injections in past years gave her relief for many months.  Review of Systems all systems updated  unchanged.   Objective: Vital Signs: BP 136/79   Pulse 80   Ht 5' 8.5" (1.74 m)   Wt 211 lb (95.7 kg)   BMI 31.62 kg/m   Physical Exam Constitutional:      Appearance: She is well-developed.  HENT:     Head: Normocephalic.     Right Ear: External ear normal.     Left Ear: External ear normal. There is no impacted cerumen.  Eyes:     Pupils: Pupils are equal, round, and reactive to light.  Neck:     Thyroid: No thyromegaly.     Trachea: No tracheal deviation.  Cardiovascular:     Rate and Rhythm: Normal rate.  Pulmonary:     Effort: Pulmonary effort is normal.  Abdominal:     Palpations: Abdomen is soft.  Musculoskeletal:     Cervical back: No rigidity.  Skin:    General: Skin is warm and dry.  Neurological:     Mental Status: She is alert and oriented to person, place, and time.  Psychiatric:        Behavior: Behavior normal.     Ortho Exam previous hip arthroplasties without hip limp.  She has tenderness to the left knee crepitus with knee range of motion.  Negative logroll the hips.  Positive  sciatic notch tenderness right and left.  Well-healed lumbar incision.  Specialty Comments:  No specialty comments available.  Imaging: No results found.   PMFS History: Patient Active Problem List   Diagnosis Date Noted   Primary osteoarthritis of left shoulder 01/14/2022   Status post left hip replacement 01/10/2021   Vitamin D deficiency 06/27/2020   Peripheral sensory neuropathy due to type 2 diabetes mellitus ([place]) 12/01/2019   Mild intermittent asthma 12/01/2019   Myositis 12/01/2019   Chronic low back pain 01/18/2019   Status post total replacement of right hip 11/18/2018   Spinal stenosis of lumbar region with neurogenic claudication 04/20/2018   Impingement syndrome of right shoulder 04/20/2018   Type II diabetes mellitus, uncontrolled 11/05/2017   Mixed hyperlipidemia 06/04/2017   Acquired hypothyroidism 03/05/2017   Normocytic anemia 01/24/2017    Bilateral carpal tunnel syndrome 05/06/2016   Essential hypertension, benign 02/20/2016   Primary osteoarthritis of left knee 05/24/2014   Elevated blood pressure reading 09/29/2011   Chronic back pain 09/29/2011   Leg edema 09/29/2011   Obesity with body mass index 30 or greater 09/29/2011   Past Medical History:  Diagnosis Date   Allergy    Anemia    Arthritis    Asthma    as child only   Back pain    Diabetes mellitus without complication ([place])    Hypertension    Hypothyroidism     Family History  Problem Relation Age of Onset   Asthma Mother    Cancer Mother        AML   Leukemia Mother    Heart disease Father    Hypertension Father    Kidney disease Father    Heart attack Brother    Breast cancer Maternal Aunt    Multiple sclerosis Maternal Aunt     Past Surgical History:  Procedure Laterality Date   ABDOMINAL HYSTERECTOMY  1989   fibroid-no longer needs pap   [place]   DDD   CARPAL TUNNEL RELEASE Left 05/28/2016   Procedure: CARPAL TUNNEL RELEASE;  Surgeon: Daryll Brod, MD;  Location: [facility];  Service: Orthopedics;  Laterality: Left;   CARPAL TUNNEL RELEASE Right 03/17/2018   Procedure: CARPAL TUNNEL RELEASE;  Surgeon: Daryll Brod, MD;  Location: [facility];  Service: Orthopedics;  Laterality: Right;   CARPAL TUNNEL RELEASE Left    TOTAL HIP ARTHROPLASTY Right 11/18/2018   Procedure: RIGHT TOTAL HIP ARTHROPLASTY ANTERIOR APPROACH;  Surgeon: Mcarthur Rossetti, MD;  Location: [facility];  Service: Orthopedics;  Laterality: Right;   TOTAL HIP ARTHROPLASTY Left 01/10/2021   Procedure: LEFT TOTAL HIP ARTHROPLASTY ANTERIOR APPROACH;  Surgeon: Mcarthur Rossetti, MD;  Location: [facility];  Service: Orthopedics;  Laterality: Left;   TUBAL LIGATION     Social History   Occupational History   Not on file  Tobacco Use   Smoking status: Never   Smokeless tobacco: Never  Vaping Use   Vaping Use: Never used   Substance and Sexual Activity   Alcohol use: Yes    Comment: social   Drug use: No   Sexual activity: Not Currently

## 2022-04-30 ENCOUNTER — Other Ambulatory Visit: Payer: Self-pay | Admitting: Physician Assistant

## 2022-05-04 ENCOUNTER — Telehealth: Payer: Self-pay | Admitting: Physical Medicine and Rehabilitation

## 2022-05-04 ENCOUNTER — Other Ambulatory Visit: Payer: Self-pay | Admitting: Physical Medicine and Rehabilitation

## 2022-05-04 MED ORDER — DIAZEPAM 5 MG PO TABS: ORAL_TABLET | ORAL | 0 refills | Status: AC

## 2022-05-04 NOTE — Telephone Encounter (Signed)
Spoke with patient and scheduled injection for 05/11/22. She is requesting pre procedure Valium to be sent to [facility]

## 2022-05-04 NOTE — Telephone Encounter (Signed)
Patient returned call asked for a call back. The number to contact patient is [phone]

## 2022-05-07 ENCOUNTER — Ambulatory Visit (INDEPENDENT_AMBULATORY_CARE_PROVIDER_SITE_OTHER): Payer: 59 | Admitting: Family Medicine

## 2022-05-07 ENCOUNTER — Ambulatory Visit: Payer: Self-pay

## 2022-05-07 ENCOUNTER — Encounter: Payer: Self-pay | Admitting: Family Medicine

## 2022-05-07 VITALS — BP 122/76 | Ht 68.5 in | Wt 211.0 lb

## 2022-05-07 DIAGNOSIS — M19012 Primary osteoarthritis, left shoulder: Secondary | ICD-10-CM | POA: Diagnosis not present

## 2022-05-07 DIAGNOSIS — M1712 Unilateral primary osteoarthritis, left knee: Secondary | ICD-10-CM | POA: Diagnosis not present

## 2022-05-07 MED ORDER — TRIAMCINOLONE ACETONIDE 40 MG/ML IJ SUSP
40.0000 mg | Freq: Once | INTRAMUSCULAR | Status: AC
  Administered 2022-05-07: 40 mg via INTRA_ARTICULAR

## 2022-05-07 NOTE — Assessment & Plan Note (Signed)
Acute on chronic in nature.  Has an effusion on exam.  Has known degenerative changes on previous imaging. - counseled on home exercise therapy and supportive care - could consider nerve block or physical therapy  - injection today

## 2022-05-07 NOTE — Progress Notes (Signed)
[name] [name] - 72 y.o. female MRN [id number]  Date of birth: Feb 23, 1951  SUBJECTIVE:  Including CC & ROS.  No chief complaint on file.   [name] [name] is a 72 y.o. female that is presenting with exacerbation of her left shoulder pain. She's also had lumbar radicular pain and left knee pain. She reports already having a left and right hip athroplasty.    Review of Systems See HPI   HISTORY: Past Medical, Surgical, Social, and Family History Reviewed & Updated per EMR.   Pertinent Historical Findings include:  Past Medical History:  Diagnosis Date   Allergy    Anemia    Arthritis    Asthma    as child only   Back pain    Diabetes mellitus without complication ([place])    Hypertension    Hypothyroidism     Past Surgical History:  Procedure Laterality Date   ABDOMINAL HYSTERECTOMY  1989   fibroid-no longer needs pap   [place]   DDD   CARPAL TUNNEL RELEASE Left 05/28/2016   Procedure: CARPAL TUNNEL RELEASE;  Surgeon: Daryll Brod, MD;  Location: [facility];  Service: Orthopedics;  Laterality: Left;   CARPAL TUNNEL RELEASE Right 03/17/2018   Procedure: CARPAL TUNNEL RELEASE;  Surgeon: Daryll Brod, MD;  Location: [facility];  Service: Orthopedics;  Laterality: Right;   CARPAL TUNNEL RELEASE Left    TOTAL HIP ARTHROPLASTY Right 11/18/2018   Procedure: RIGHT TOTAL HIP ARTHROPLASTY ANTERIOR APPROACH;  Surgeon: Mcarthur Rossetti, MD;  Location: [facility];  Service: Orthopedics;  Laterality: Right;   TOTAL HIP ARTHROPLASTY Left 01/10/2021   Procedure: LEFT TOTAL HIP ARTHROPLASTY ANTERIOR APPROACH;  Surgeon: Mcarthur Rossetti, MD;  Location: [facility];  Service: Orthopedics;  Laterality: Left;   TUBAL LIGATION       PHYSICAL EXAM:  VS: BP 122/76   Ht 5' 8.5" (1.74 m)   Wt 211 lb (95.7 kg)   BMI 31.62 kg/m  Physical Exam Gen: NAD, alert, cooperative with exam, well-appearing MSK:  Neurovascularly intact      Aspiration/Injection Procedure Note [name] A Nastasi 1950/06/02  Procedure: Injection Indications: left shoulder pain  Procedure Details Consent: Risks of procedure as well as the alternatives and risks of each were explained to the (patient/caregiver).  Consent for procedure obtained. Time Out: Verified patient identification, verified procedure, site/side was marked, verified correct patient position, special equipment/implants available, medications/allergies/relevent history reviewed, required imaging and test results available.  Performed.  The area was cleaned with iodine and alcohol swabs.    The left glenohumeral joint was injected using 3 cc of 1% lidocaine on a 22-gauge 3-1/2 inch needle.  The syringe was switched to mixture containing 1 cc's of 40 mg Kenalog and 4 cc's of 0.25% bupivacaine was injected.  Ultrasound was used. Images were obtained in long views showing the injection.     A sterile dressing was applied.  Patient did tolerate procedure well.     ASSESSMENT & PLAN:   Primary osteoarthritis of left shoulder Acute on chronic in nature.  Has an effusion on exam.  Has known degenerative changes on previous imaging. - counseled on home exercise therapy and supportive care - could consider nerve block or physical therapy  - injection today   Primary osteoarthritis of left knee Recently had a steroid injection. Has degenerative changes and what's to avoid surgery  - counseled on home exercise therapy and supportive care - could consider gel injection

## 2022-05-07 NOTE — Assessment & Plan Note (Signed)
Recently had a steroid injection. Has degenerative changes and what's to avoid surgery  - counseled on home exercise therapy and supportive care - could consider gel injection

## 2022-05-07 NOTE — Patient Instructions (Signed)
Good to see you Please use ice as needed on the knee and shoulder Please try heat on the back   You can consider compression on the shoulder and back  Please try the exercises  Please send me a message in MyChart with any questions or updates.  Please see me back in 4-6 weeks or as needed if better.   --Dr. Raeford Razor

## 2022-05-11 ENCOUNTER — Ambulatory Visit: Payer: Self-pay

## 2022-05-11 ENCOUNTER — Ambulatory Visit (INDEPENDENT_AMBULATORY_CARE_PROVIDER_SITE_OTHER): Payer: 59 | Admitting: Physical Medicine and Rehabilitation

## 2022-05-11 VITALS — BP 143/82 | HR 83

## 2022-05-11 DIAGNOSIS — M5416 Radiculopathy, lumbar region: Secondary | ICD-10-CM | POA: Diagnosis not present

## 2022-05-11 MED ORDER — METHYLPREDNISOLONE ACETATE 80 MG/ML IJ SUSP
80.0000 mg | Freq: Once | INTRAMUSCULAR | Status: AC
  Administered 2022-05-11: 80 mg

## 2022-05-11 NOTE — Patient Instructions (Signed)

## 2022-05-11 NOTE — Progress Notes (Unsigned)
Functional Pain Scale - descriptive words and definitions  Unmanageable (7)  Pain interferes with normal ADL's/nothing seems to help/sleep is very difficult/active distractions are very difficult to concentrate on. Severe range order  Average Pain 8   +Driver, -BT, -Dye Allergies.  Lower back pain on both sides with radiation into legs, mostly on left. Hard to stand

## 2022-05-13 NOTE — Progress Notes (Signed)
[name] [name] - 72 y.o. female MRN [id number]  Date of birth: November 08, 1950  Office Visit Note: Visit Date: 05/11/2022 PCP: Audley Hose, MD Referred by: Audley Hose, MD  Subjective: Chief Complaint  Patient presents with   Lower Back - Pain   HPI:  [name] [name] is a 72 y.o. female who comes in today at the request of Dr. Rodell Perna for planned Bilateral L4-5 Lumbar Transforaminal epidural steroid injection with fluoroscopic guidance.  The patient has failed conservative care including home exercise, medications, time and activity modification.  This injection will be diagnostic and hopefully therapeutic.  Please see requesting physician notes for further details and justification.   ROS Otherwise per HPI.  Assessment & Plan: Visit Diagnoses:    ICD-10-CM   1. Lumbar radiculopathy  M54.16 XR C-ARM NO REPORT    Epidural Steroid injection    methylPREDNISolone acetate (DEPO-MEDROL) injection 80 mg      Plan: No additional findings.   Meds & Orders:  Meds ordered this encounter  Medications   methylPREDNISolone acetate (DEPO-MEDROL) injection 80 mg    Orders Placed This Encounter  Procedures   XR C-ARM NO REPORT   Epidural Steroid injection    Follow-up: Return for visit to requesting provider as needed.   Procedures: No procedures performed  Lumbosacral Transforaminal Epidural Steroid Injection - Sub-Pedicular Approach with Fluoroscopic Guidance  Patient: [name] [name]      Date of Birth: 12-13-1950 MRN: [id number] PCP: Audley Hose, MD      Visit Date: 05/11/2022   Universal Protocol:    Date/Time: 05/11/2022  Consent Given By: the patient  Position: PRONE  Additional Comments: Vital signs were monitored before and after the procedure. Patient was prepped and draped in the usual sterile fashion. The correct patient, procedure, and site was verified.   Injection Procedure Details:   Procedure diagnoses: Lumbar radiculopathy [M54.16]     Meds Administered:  Meds ordered this encounter  Medications   methylPREDNISolone acetate (DEPO-MEDROL) injection 80 mg    Laterality: Bilateral  Location/Site: L4  Needle:5.0 in., 22 ga.  Short bevel or Quincke spinal needle  Needle Placement: Transforaminal  Findings:    -Comments: Excellent flow of contrast along the nerve, nerve root and into the epidural space.  Procedure Details: After squaring off the end-plates to get a true AP view, the C-arm was positioned so that an oblique view of the foramen as noted above was visualized. The target area is just inferior to the "nose of the scotty dog" or sub pedicular. The soft tissues overlying this structure were infiltrated with 2-3 ml. of 1% Lidocaine without Epinephrine.  The spinal needle was inserted toward the target using a "trajectory" view along the fluoroscope beam.  Under AP and lateral visualization, the needle was advanced so it did not puncture dura and was located close the 6 O'Clock position of the pedical in AP tracterory. Biplanar projections were used to confirm position. Aspiration was confirmed to be negative for CSF and/or blood. A 1-2 ml. volume of Isovue-250 was injected and flow of contrast was noted at each level. Radiographs were obtained for documentation purposes.   After attaining the desired flow of contrast documented above, a 0.5 to 1.0 ml test dose of 0.25% Marcaine was injected into each respective transforaminal space.  The patient was observed for 90 seconds post injection.  After no sensory deficits were reported, and normal lower extremity motor function was noted,   the above injectate  was administered so that equal amounts of the injectate were placed at each foramen (level) into the transforaminal epidural space.   Additional Comments:  No complications occurred Dressing: 2 x 2 sterile gauze and Band-Aid    Post-procedure details: Patient was observed during the procedure. Post-procedure  instructions were reviewed.  Patient left the clinic in stable condition.    Clinical History: MRI LUMBAR SPINE WITHOUT AND WITH CONTRAST    TECHNIQUE:  Multiplanar and multiecho pulse sequences of the lumbar spine were  obtained without and with intravenous contrast.    CONTRAST:  19mL MULTIHANCE GADOBENATE DIMEGLUMINE 529 MG/ML IV SOLN    COMPARISON:  Lumbar MRI 08/10/2009    FINDINGS:  Segmentation: Normal    Alignment: Mild retrolisthesis L1-2 and L2-3. 5 mm anterolisthesis  L4-5 has progressed.    Vertebrae: Negative for fracture or mass. Fatty endplate changes at  P9-J0 due to disc degeneration similar to the prior study.    Conus medullaris and cauda equina: Conus extends to the L1-2 level.  Conus and cauda equina appear normal.    Paraspinal and other soft tissues: No retroperitoneal mass or  adenopathy    Disc levels:    T12-L1: Small right paracentral disc protrusion without significant  stenosis. Mild facet degeneration.    L1-2: Mild retrolisthesis. Disc bulging and facet degeneration  without significant stenosis    L2-3: Progressive disc degeneration and retrolisthesis. Progressive  facet degeneration. Moderate foraminal encroachment bilaterally left  greater than right has progressed with potential for impingement of  the L2 nerve roots especially on the left. Mild spinal stenosis has  progressed.    L3-4: Diffuse disc bulging and facet hypertrophy has progressed.  Severe spinal stenosis has progressed. Moderate right foraminal  encroachment has progressed    L4-5: Progressive anterolisthesis. Severe facet degeneration has  progressed. Moderate spinal stenosis similar to the prior study    L5-S1: Remote laminectomy on the right with epidural scarring around  the right S1 nerve root and thecal sac unchanged. No recurrent disc  protrusion or stenosis.    IMPRESSION:  Mild spinal stenosis L2-3 has progressed. Progressive disc and facet   degeneration. Moderate foraminal encroachment left greater than  right has progressed in the interval    Severe spinal stenosis L3-4 has progressed. Moderate right foraminal  encroachment also has progressed    Progressive anterolisthesis at L4-5 with moderate spinal stenosis  similar to the prior study    Remote laminectomy right L5-S1 with epidural scar on the right. No  recurrent disc protrusion or stenosis.      Electronically Signed    By: Franchot Gallo M.D.    On: 08/21/2017 13:06     Objective:  VS:  HT:    WT:   BMI:     BP:(!) 143/82  HR:83bpm  TEMP: ( )  RESP:  Physical Exam Vitals and nursing note reviewed.  Constitutional:      General: She is not in acute distress.    Appearance: Normal appearance. She is not ill-appearing.  HENT:     Head: Normocephalic and atraumatic.     Right Ear: External ear normal.     Left Ear: External ear normal.  Eyes:     Extraocular Movements: Extraocular movements intact.  Cardiovascular:     Rate and Rhythm: Normal rate.     Pulses: Normal pulses.  Pulmonary:     Effort: Pulmonary effort is normal. No respiratory distress.  Abdominal:     General: There is  no distension.     Palpations: Abdomen is soft.  Musculoskeletal:        General: Tenderness present.     Cervical back: Neck supple.     Right lower leg: No edema.     Left lower leg: No edema.     Comments: Patient has good distal strength with no pain over the greater trochanters.  No clonus or focal weakness.  Skin:    Findings: No erythema, lesion or rash.  Neurological:     General: No focal deficit present.     Mental Status: She is alert and oriented to person, place, and time.     Sensory: No sensory deficit.     Motor: No weakness or abnormal muscle tone.     Coordination: Coordination normal.  Psychiatric:        Mood and Affect: Mood normal.        Behavior: Behavior normal.      Imaging: No results found.

## 2022-05-13 NOTE — Procedures (Signed)
Lumbosacral Transforaminal Epidural Steroid Injection - Sub-Pedicular Approach with Fluoroscopic Guidance  Patient: [name] [name]      Date of Birth: 10-08-1950 MRN: [id number] PCP: Audley Hose, MD      Visit Date: 05/11/2022   Universal Protocol:    Date/Time: 05/11/2022  Consent Given By: the patient  Position: PRONE  Additional Comments: Vital signs were monitored before and after the procedure. Patient was prepped and draped in the usual sterile fashion. The correct patient, procedure, and site was verified.   Injection Procedure Details:   Procedure diagnoses: Lumbar radiculopathy [M54.16]    Meds Administered:  Meds ordered this encounter  Medications   methylPREDNISolone acetate (DEPO-MEDROL) injection 80 mg    Laterality: Bilateral  Location/Site: L4  Needle:5.0 in., 22 ga.  Short bevel or Quincke spinal needle  Needle Placement: Transforaminal  Findings:    -Comments: Excellent flow of contrast along the nerve, nerve root and into the epidural space.  Procedure Details: After squaring off the end-plates to get a true AP view, the C-arm was positioned so that an oblique view of the foramen as noted above was visualized. The target area is just inferior to the "nose of the scotty dog" or sub pedicular. The soft tissues overlying this structure were infiltrated with 2-3 ml. of 1% Lidocaine without Epinephrine.  The spinal needle was inserted toward the target using a "trajectory" view along the fluoroscope beam.  Under AP and lateral visualization, the needle was advanced so it did not puncture dura and was located close the 6 O'Clock position of the pedical in AP tracterory. Biplanar projections were used to confirm position. Aspiration was confirmed to be negative for CSF and/or blood. A 1-2 ml. volume of Isovue-250 was injected and flow of contrast was noted at each level. Radiographs were obtained for documentation purposes.   After attaining the  desired flow of contrast documented above, a 0.5 to 1.0 ml test dose of 0.25% Marcaine was injected into each respective transforaminal space.  The patient was observed for 90 seconds post injection.  After no sensory deficits were reported, and normal lower extremity motor function was noted,   the above injectate was administered so that equal amounts of the injectate were placed at each foramen (level) into the transforaminal epidural space.   Additional Comments:  No complications occurred Dressing: 2 x 2 sterile gauze and Band-Aid    Post-procedure details: Patient was observed during the procedure. Post-procedure instructions were reviewed.  Patient left the clinic in stable condition.

## 2022-05-14 ENCOUNTER — Encounter (HOSPITAL_BASED_OUTPATIENT_CLINIC_OR_DEPARTMENT_OTHER): Payer: Self-pay | Admitting: Obstetrics & Gynecology

## 2022-05-14 ENCOUNTER — Ambulatory Visit (INDEPENDENT_AMBULATORY_CARE_PROVIDER_SITE_OTHER): Payer: 59 | Admitting: Obstetrics & Gynecology

## 2022-05-14 VITALS — BP 142/83 | HR 79 | Ht 68.5 in | Wt 205.4 lb

## 2022-05-14 DIAGNOSIS — Z1231 Encounter for screening mammogram for malignant neoplasm of breast: Secondary | ICD-10-CM

## 2022-05-14 DIAGNOSIS — R151 Fecal smearing: Secondary | ICD-10-CM

## 2022-05-14 DIAGNOSIS — N8110 Cystocele, unspecified: Secondary | ICD-10-CM

## 2022-05-14 NOTE — Progress Notes (Signed)
72 y.o. G3  Divorced Black or [place] American female here for new patient appointment.  She reports she's had some pelvic prolapse.  She has also noted some difficulty with getting clean after a bowel movement.  Reports she will wipe and be clean.  When she goes back to the bathroom later, for example to empty her bladder, she will not be totally clean around the rectum.  Does not typically have UTIs.  Doesn't leak urine or see stool in underwear.    Denies vaginal bleeding.  H/o hysterectomy  Colonoscopy:  with Dr. Collene Mares.  Has been done in the past 10 years per pt. MMG:  02/21/2021 Negative BMD:  02/21/2021 Last pap smear:  hysterectomy years.   H/o abnormal pap smear:  no hx    reports that she has never smoked. She has never used smokeless tobacco. She reports current alcohol use. She reports that she does not use drugs.  Past Medical History:  Diagnosis Date   Allergy    Anemia    Arthritis    Asthma    as child only   Back pain    Diabetes mellitus without complication ([place])    Hypertension    Hypothyroidism     Past Surgical History:  Procedure Laterality Date   ABDOMINAL HYSTERECTOMY  1989   fibroid-no longer needs pap   [place]   DDD   CARPAL TUNNEL RELEASE Left 05/28/2016   Procedure: CARPAL TUNNEL RELEASE;  Surgeon: Daryll Brod, MD;  Location: [facility];  Service: Orthopedics;  Laterality: Left;   CARPAL TUNNEL RELEASE Right 03/17/2018   Procedure: CARPAL TUNNEL RELEASE;  Surgeon: Daryll Brod, MD;  Location: [facility];  Service: Orthopedics;  Laterality: Right;   CARPAL TUNNEL RELEASE Left    TOTAL HIP ARTHROPLASTY Right 11/18/2018   Procedure: RIGHT TOTAL HIP ARTHROPLASTY ANTERIOR APPROACH;  Surgeon: Mcarthur Rossetti, MD;  Location: [facility];  Service: Orthopedics;  Laterality: Right;   TOTAL HIP ARTHROPLASTY Left 01/10/2021   Procedure: LEFT TOTAL HIP ARTHROPLASTY ANTERIOR APPROACH;  Surgeon: Mcarthur Rossetti, MD;  Location: [facility];  Service: Orthopedics;  Laterality: Left;   TUBAL LIGATION      Current Outpatient Medications  Medication Sig Dispense Refill   albuterol (VENTOLIN HFA) 108 (90 Base) MCG/ACT inhaler Inhale 2 puffs into the lungs every 4 (four) hours as needed.     amLODipine (NORVASC) 10 MG tablet Take 10 mg by mouth daily.     Calcium Carb-Cholecalciferol (CALTRATE 600+D3) 600-20 MG-MCG TABS Take 1 tablet every day by oral route for 90 days.     diazepam (VALIUM) 5 MG tablet Take one tablet by mouth with food one hour prior to procedure. May repeat 30 minutes prior if needed. 2 tablet 0   diclofenac (VOLTAREN) 75 MG EC tablet TAKE ONE TABLET TWICE DAILY 60 tablet 1   diclofenac Sodium (VOLTAREN) 1 % GEL APPLY 4 GRAMS TO THE AFFECTED AREA(S) BY TOPICAL ROUTE 4 TIMES PER DAY as needed     diphenhydrAMINE (BENADRYL) 25 MG tablet Take 0.5 tablets (12.5 mg total) by mouth at bedtime as needed (cramping). 30 tablet 0   fluticasone (FLONASE) 50 MCG/ACT nasal spray 1 SPRAY TO EACH NOSTRIL EVERY DAY     furosemide (LASIX) 20 MG tablet Take 20 mg by mouth daily as needed.     glimepiride (AMARYL) 1 MG tablet Take 0.5 mg by mouth daily with breakfast.     hydrochlorothiazide (HYDRODIURIL)  12.5 MG tablet Take 12.5 mg by mouth daily.     icosapent Ethyl (VASCEPA) 1 g capsule Take 2 g by mouth 2 (two) times daily.     ketoconazole (NIZORAL) 2 % cream Apply 1 application topically 2 (two) times daily.     levothyroxine (SYNTHROID) 75 MCG tablet Take 75 mcg by mouth daily before breakfast.     methocarbamol (ROBAXIN) 500 MG tablet Take 1 tablet (500 mg total) by mouth 3 (three) times daily as needed. 30 tablet 0   Multiple Vitamins-Minerals (CERTAVITE SENIOR/ANTIOXIDANT) TABS Take 1 tablet by mouth daily.     oxyCODONE-acetaminophen (PERCOCET/ROXICET) 5-325 MG tablet Take 1 tablet by mouth every 8 (eight) hours as needed for severe pain. 15 tablet 0   potassium chloride (KLOR-CON) 10 MEQ tablet  Take 10 mEq by mouth daily as needed.     rosuvastatin (CRESTOR) 20 MG tablet Take 1 tablet by mouth daily.     traMADol (ULTRAM) 50 MG tablet Take 1 tablet (50 mg total) by mouth every 6 (six) hours as needed. 30 tablet 0   No current facility-administered medications for this visit.    Family History  Problem Relation Age of Onset   Asthma Mother    Cancer Mother        AML   Leukemia Mother    Heart disease Father    Hypertension Father    Kidney disease Father    Heart attack Brother    Breast cancer Maternal Aunt    Multiple sclerosis Maternal Aunt     Review of Systems  Constitutional: Negative.   Genitourinary: Negative.     Exam:   BP (!) 154/72 (BP Location: Right Arm, Patient Position: Sitting, Cuff Size: Large)   Pulse 77   Ht 5' 8.5" (1.74 m) Comment: Reported  Wt 205 lb 6.4 oz (93.2 kg)   BMI 30.78 kg/m   Height: 5' 8.5" (174 cm) (Reported)  General appearance: alert, cooperative and appears stated age  Lymph nodes: Cervical, supraclavicular, and axillary nodes normal.  No abnormal inguinal nodes palpated Neurologic: Grossly normal  Pelvic: External genitalia:  no lesions              Urethra:  normal appearing urethra with no masses, tenderness or lesions              Bartholins and Skenes: normal                 Vagina: normal appearing vagina with atrophic changes and no discharge, no lesions, second degree cystocele noted today              Cervix: absent              Pap taken: No. Bimanual Exam:  Uterus:  uterus absent              Adnexa: no mass, fullness, tenderness               Rectovaginal: Confirms               Anus:  decreased tone, pt has difficulty squeezing anal sphincter, no lesions  Chaperone, Octaviano Batty, CMA, was present for exam.  Assessment/Plan: 1. Female cystocele - options for treatment discussed.  Pt is desirous of no surgery at this time.  Pessary use and pelvic PT discussed.  She is interested in started PT.  Order  placed.  D/w pt I think she will have improvement with PT. - Ambulatory referral to Physical  Therapy  2. Fecal smearing - Ambulatory referral to Physical Therapy  3. Encounter for screening mammogram for malignant neoplasm of breast - discussed with pt locations for having MMG completed.  She would like to have done at [facility]. - MM 3D SCREEN BREAST BILATERAL; Future

## 2022-05-23 ENCOUNTER — Ambulatory Visit (HOSPITAL_BASED_OUTPATIENT_CLINIC_OR_DEPARTMENT_OTHER)
Admission: RE | Admit: 2022-05-23 | Discharge: 2022-05-23 | Disposition: A | Discharge status: Home / Self Care | Payer: 59 | Source: Ambulatory Visit | Attending: Obstetrics & Gynecology | Admitting: Obstetrics & Gynecology

## 2022-05-23 DIAGNOSIS — Z1231 Encounter for screening mammogram for malignant neoplasm of breast: Secondary | ICD-10-CM | POA: Insufficient documentation

## 2022-05-25 ENCOUNTER — Encounter: Payer: Self-pay | Admitting: *Deleted

## 2022-05-28 ENCOUNTER — Ambulatory Visit (HOSPITAL_BASED_OUTPATIENT_CLINIC_OR_DEPARTMENT_OTHER): Payer: 59 | Admitting: Obstetrics & Gynecology

## 2022-06-11 ENCOUNTER — Encounter: Payer: Self-pay | Admitting: Family Medicine

## 2022-06-11 ENCOUNTER — Encounter: Payer: Self-pay | Admitting: Radiology

## 2022-06-11 ENCOUNTER — Ambulatory Visit (INDEPENDENT_AMBULATORY_CARE_PROVIDER_SITE_OTHER): Payer: 59 | Admitting: Family Medicine

## 2022-06-11 VITALS — BP 122/80 | Ht 68.5 in | Wt 205.0 lb

## 2022-06-11 DIAGNOSIS — M47817 Spondylosis without myelopathy or radiculopathy, lumbosacral region: Secondary | ICD-10-CM | POA: Diagnosis not present

## 2022-06-11 DIAGNOSIS — M19012 Primary osteoarthritis, left shoulder: Secondary | ICD-10-CM

## 2022-06-11 DIAGNOSIS — M1712 Unilateral primary osteoarthritis, left knee: Secondary | ICD-10-CM

## 2022-06-11 NOTE — Assessment & Plan Note (Signed)
Doing well with since the most recent injection.  Has better range of motion and strength. -Counseled on home exercise therapy and supportive care. -Referral to physical therapy. -Could consider suprascapular nerve block.

## 2022-06-11 NOTE — Patient Instructions (Signed)
Good to see you Please use heat on the back and try ice as needed on the shoulder and knee  We have made a referral to physical therapy  We'll call when the gel injection is in   Please send me a message in MyChart with any questions or updates.  Please see me back to have the gel injection .   --Dr. Raeford Razor

## 2022-06-11 NOTE — Assessment & Plan Note (Signed)
Acute on chronic in nature.  Has received a steroid injection a few months ago.  Starting to have some soreness and stiffness in the joint. -Counseled on home exercise therapy and supportive care. -Referral to physical therapy. -Pursue gel injection.

## 2022-06-11 NOTE — Progress Notes (Signed)
  [name] [name] - 72 y.o. female MRN [id number]  Date of birth: 1950/09/04  SUBJECTIVE:  Including CC & ROS.  No chief complaint on file.   [name] [name] is a 72 y.o. female that is following up for left shoulder and left knee pain.  She reports worsening of her low back pain.  She most recently has received an injection into the left shoulder joint.  She is doing well with better range of motion.  Her left knee is starting to be sore and stiff.   Review of Systems See HPI   HISTORY: Past Medical, Surgical, Social, and Family History Reviewed & Updated per EMR.   Pertinent Historical Findings include:  Past Medical History:  Diagnosis Date   Allergy    Anemia    Arthritis    Asthma    as child only   Back pain    Diabetes mellitus without complication ([place])    Hypertension    Hypothyroidism     Past Surgical History:  Procedure Laterality Date   ABDOMINAL HYSTERECTOMY  1989   fibroid-no longer needs pap   [place]   DDD   CARPAL TUNNEL RELEASE Left 05/28/2016   Procedure: CARPAL TUNNEL RELEASE;  Surgeon: Daryll Brod, MD;  Location: [place] [facility];  Service: Orthopedics;  Laterality: Left;   CARPAL TUNNEL RELEASE Right 03/17/2018   Procedure: CARPAL TUNNEL RELEASE;  Surgeon: Daryll Brod, MD;  Location: [facility];  Service: Orthopedics;  Laterality: Right;   TOTAL HIP ARTHROPLASTY Right 11/18/2018   Procedure: RIGHT TOTAL HIP ARTHROPLASTY ANTERIOR APPROACH;  Surgeon: Mcarthur Rossetti, MD;  [facility];  Service: Orthopedics;  Laterality: Right;   TOTAL HIP ARTHROPLASTY Left 01/10/2021   Procedure: LEFT TOTAL HIP ARTHROPLASTY ANTERIOR APPROACH;  Surgeon: Mcarthur Rossetti, MD;  [facility];  Service: Orthopedics;  Laterality: Left;   TUBAL LIGATION       PHYSICAL EXAM:  VS: BP 122/80 (BP Location: Left Arm, Patient Position: Sitting)   Ht 5' 8.5" (1.74 m)   Wt 205 lb (93 kg)   BMI 30.72 kg/m  Physical  Exam Gen: NAD, alert, cooperative with exam, well-appearing MSK:  Neurovascularly intact       ASSESSMENT & PLAN:   Facet arthropathy, lumbosacral Acute on chronic in nature.  She reports her most recent injection was an epidural.  Having more localized low back pain. -Counseled on home exercise therapy and supportive care. -Referral to physical therapy. -Could consider facet injections.  Primary osteoarthritis of left knee Acute on chronic in nature.  Has received a steroid injection a few months ago.  Starting to have some soreness and stiffness in the joint. -Counseled on home exercise therapy and supportive care. -Referral to physical therapy. -Pursue gel injection.  Primary osteoarthritis of left shoulder Doing well with since the most recent injection.  Has better range of motion and strength. -Counseled on home exercise therapy and supportive care. -Referral to physical therapy. -Could consider suprascapular nerve block.

## 2022-06-11 NOTE — Assessment & Plan Note (Signed)
Acute on chronic in nature.  She reports her most recent injection was an epidural.  Having more localized low back pain. -Counseled on home exercise therapy and supportive care. -Referral to physical therapy. -Could consider facet injections.

## 2022-06-24 ENCOUNTER — Ambulatory Visit: Payer: 59 | Attending: Obstetrics & Gynecology

## 2022-06-24 ENCOUNTER — Other Ambulatory Visit: Payer: Self-pay

## 2022-06-24 DIAGNOSIS — R3915 Urgency of urination: Secondary | ICD-10-CM

## 2022-06-24 DIAGNOSIS — M6281 Muscle weakness (generalized): Secondary | ICD-10-CM | POA: Diagnosis not present

## 2022-06-24 DIAGNOSIS — R279 Unspecified lack of coordination: Secondary | ICD-10-CM | POA: Insufficient documentation

## 2022-06-24 DIAGNOSIS — R293 Abnormal posture: Secondary | ICD-10-CM | POA: Insufficient documentation

## 2022-06-24 DIAGNOSIS — N8110 Cystocele, unspecified: Secondary | ICD-10-CM | POA: Diagnosis not present

## 2022-06-24 DIAGNOSIS — R151 Fecal smearing: Secondary | ICD-10-CM

## 2022-06-24 NOTE — Therapy (Signed)
OUTPATIENT PHYSICAL THERAPY FEMALE PELVIC EVALUATION   Patient Name: [name] [name] MRN: [id number] DOB:Jul 11, 1950, 72 y.o., female Today's Date: 06/24/2022  END OF SESSION:  PT End of Session - 06/24/22 1530     Visit Number 1    Date for PT Re-Evaluation 09/02/22    Authorization Type [employer] Medicare    PT Start Time [id number]    PT Stop Time 1619    PT Time Calculation (min) 49 min    Activity Tolerance Patient tolerated treatment well    Behavior During Therapy WFL for tasks assessed/performed             Past Medical History:  Diagnosis Date   Allergy    Anemia    Arthritis    Asthma    as child only   Back pain    Diabetes mellitus without complication ([place])    Hypertension    Hypothyroidism    Past Surgical History:  Procedure Laterality Date   ABDOMINAL HYSTERECTOMY  1989   fibroid-no longer needs pap   [place]   DDD   CARPAL TUNNEL RELEASE Left 05/28/2016   Procedure: CARPAL TUNNEL RELEASE;  Surgeon: Daryll Brod, MD;  Location: [facility];  Service: Orthopedics;  Laterality: Left;   CARPAL TUNNEL RELEASE Right 03/17/2018   Procedure: CARPAL TUNNEL RELEASE;  Surgeon: Daryll Brod, MD;  Location: [facility];  Service: Orthopedics;  Laterality: Right;   TOTAL HIP ARTHROPLASTY Right 11/18/2018   Procedure: RIGHT TOTAL HIP ARTHROPLASTY ANTERIOR APPROACH;  Surgeon: Mcarthur Rossetti, MD;  Location: [facility];  Service: Orthopedics;  Laterality: Right;   TOTAL HIP ARTHROPLASTY Left 01/10/2021   Procedure: LEFT TOTAL HIP ARTHROPLASTY ANTERIOR APPROACH;  Surgeon: Mcarthur Rossetti, MD;  Location: [facility];  Service: Orthopedics;  Laterality: Left;   TUBAL LIGATION     Patient Active Problem List   Diagnosis Date Noted   Facet arthropathy, lumbosacral 06/11/2022   Primary osteoarthritis of left shoulder 01/14/2022   Status post left hip replacement 01/10/2021   Vitamin D deficiency 06/27/2020   Peripheral sensory  neuropathy due to type 2 diabetes mellitus ([place]) 12/01/2019   Mild intermittent asthma 12/01/2019   Myositis 12/01/2019   Status post total replacement of right hip 11/18/2018   Spinal stenosis of lumbar region with neurogenic claudication 04/20/2018   Impingement syndrome of right shoulder 04/20/2018   Type II diabetes mellitus, uncontrolled 11/05/2017   Mixed hyperlipidemia 06/04/2017   Acquired hypothyroidism 03/05/2017   Normocytic anemia 01/24/2017   Bilateral carpal tunnel syndrome 05/06/2016   Essential hypertension, benign 02/20/2016   Primary osteoarthritis of left knee 05/24/2014   Elevated blood pressure reading 09/29/2011   Chronic back pain 09/29/2011   Leg edema 09/29/2011   Obesity with body mass index 30 or greater 09/29/2011    PCP: Audley Hose, MD  REFERRING PROVIDER: Megan Salon, MD   REFERRING DIAG: N81.10 (ICD-10-CM) - Female cystocele R15.1 (ICD-10-CM) - Fecal smearing  THERAPY DIAG:  Abnormal posture  Muscle weakness (generalized)  Unspecified lack of coordination  Fecal smearing  Urinary urgency  Rationale for Evaluation and Treatment: Rehabilitation  ONSET DATE: 4 months  SUBJECTIVE:  06/24/22 SUBJECTIVE STATEMENT: Pt states that about 4 months ago she started having sensation of bulging in vagina. She notices that things are different when she is urinating - the sound is different. She is unsure when she is finished.  Fluid intake: Yes: patient states that she does not drink much water, but is working on it   PAIN:  Are you having pain? Yes NPRS scale: 8/10 Pain location:  low back pain  Pain type: aching and tight Pain description: constant   Aggravating factors: standing, washing dishes Relieving factors: epidural injections in low back 3  weeks ago, sitting  PRECAUTIONS: None  WEIGHT BEARING RESTRICTIONS: No  FALLS:  Has patient fallen in last 6 months? No  LIVING ENVIRONMENT: Lives with: lives alone Lives in: House/apartment  OCCUPATION: retired  PLOF: Independent  PATIENT GOALS: to fix prolapse and decrease low back pain  PERTINENT HISTORY:  Hysterectomy, bil THA, tubal ligation,  Sexual abuse: No  BOWEL MOVEMENT: Pain with bowel movement: No Type of bowel movement:Frequency lately 1x/day and Strain Yes Fully empty rectum: Yes: - Leakage: Yes: fecal smearing; she'll wipe clean after bowel movement and then have smearing when she goes to the bathroom next Pads: No Fiber supplement: No  URINATION: Pain with urination: No Fully empty bladder: Yes: but she does have post-void dribbling; she can also sit there and more will come Stream:  inconsistent Urgency: Yes: sometimes it hits her and she has to go immediately Frequency: every hour during the day; 2-3x/night Leakage:  none Pads: No  INTERCOURSE: Pain with intercourse:  not sexually active, no history of pain   PREGNANCY: Vaginal deliveries 1 Tearing No   PROLAPSE: Cystocele vaginal bulging confirmed by MD   OBJECTIVE:  06/24/22: COGNITION: Overall cognitive status: Within functional limits for tasks assessed     SENSATION: Light touch: Appears intact Proprioception: Appears intact   GAIT: Comments: Forward flexion/decreased bil hip extension  POSTURE: rounded shoulders, forward head, decreased lumbar lordosis, increased thoracic kyphosis, and posterior pelvic tilt  PALPATION:   General  no abdominal tenderness, but restriction, most notably in Lt lower quadrant                External Perineal Exam anterior vaginal Shenk visible; dryness vaginally; hemorrhoids externally                             Internal Pelvic Floor dryness; pt reported sharpness; stool present in rectum  Patient confirms identification and approves PT to  assess internal pelvic floor and treatment Yes  PELVIC MMT:   MMT eval  Vaginal 1/5, poor coordination - consistently bearing down with squeeze  Internal Anal Sphincter 1/5  External Anal Sphincter 2/5  Puborectalis 2/5  Diastasis Recti 2 finger width separation with mild distortion with increased abdominal pressure  (Blank rows = not tested)        TONE: WNL  PROLAPSE: Grade 3 anterior vaginal [place]ger[place] laxity  TODAY'S TREATMENT:  DATE:   06/24/22: EVAL  Neuromuscular re-education: Pt provides verbal consent for internal vaginal/rectal pelvic floor exam. Pelvic floor contraction training quick flicks vaginally/rectally Therapeutic activities: Squatty potty Relaxed toilet mechanics Double voiding     PATIENT EDUCATION:  Education details: see above Person educated: Patient Education method: Explanation, Demonstration, Tactile cues, Verbal cues, and Handouts Education comprehension: verbalized understanding  HOME EXERCISE PROGRAM: [id number]  ASSESSMENT:  CLINICAL IMPRESSION: Patient is a 72 y.o. female who was seen today for physical therapy evaluation and treatment for vaginal bulge, fecal smearing, and low back pain. Exam findings notable for core weakness, abdominal midline distortion with increased pressure, external/internal vulvar/vaginal dryness, grade 3 anterior vaginal Schanz laxity, tenderness with palpation of vaginal tissue, pelvic floor weakness 1/5, external/internal anal sphincter weakness 1/5, significant difficulty with pelvic floor contraction (bearing down), and abnormal posture. Signs and symptoms are most consistent with vaginal Selner laxity, decreased pelvic floor coordination, and poor pressure management. Initial treatment consisted of education on squatty potty, relaxed toilet mechanics, balloon breathing, double voiding, and pressure  management; pelvic floor contraction training performed and added to HEP. She will continue to benefit from skilled PT intervention in order to strengthen pelvic floor, learn appropriate pressure management techniques, decrease fecal smearing, improve low back pain, and begin/progress functional strengthening program.   OBJECTIVE IMPAIRMENTS: decreased activity tolerance, decreased coordination, decreased endurance, decreased strength, increased fascial restrictions, increased muscle spasms, postural dysfunction, and pain.   ACTIVITY LIMITATIONS: lifting, bending, standing, transfers, bed mobility, and continence  PARTICIPATION LIMITATIONS: cleaning and community activity  PERSONAL FACTORS: 1 comorbidity: medical history  are also affecting patient's functional outcome.   REHAB POTENTIAL: Good  CLINICAL DECISION MAKING: Stable/uncomplicated  EVALUATION COMPLEXITY: Low   GOALS: Goals reviewed with patient? Yes  SHORT TERM GOALS: Target date: 07/29/22  Pt will be independent with HEP.   Baseline: Goal status: INITIAL  2.  Pt will be independent with use of squatty potty, relaxed toileting mechanics, and improved bowel movement techniques in order to increase ease of bowel movements and complete evacuation.   Baseline:  Goal status: INITIAL  3.  Pt will be independent with double voiding in order to completely empty bladder when urinating. Baseline:  Goal status: INITIAL    LONG TERM GOALS: Target date: 12/09/22  Pt will be independent with advanced HEP.   Baseline:  Goal status: INITIAL  2.  Pt will be able to correctly perform diaphragmatic breathing and appropriate pressure management in order to prevent worsening vaginal Hach laxity and improve pelvic floor A/ROM.   Baseline:  Goal status: INITIAL  3.  Pt will demonstrate normal pelvic floor muscle tone and A/ROM, able to achieve 3/5 strength with contractions and 10 sec endurance, in order to provide appropriate  lumbopelvic support in functional activities.   Baseline:  Goal status: INITIAL  4.  Pt will report no episodes of fecal incontinence or post-void dribbling in order to improve confidence in community activities and personal hygiene.   Baseline:  Goal status: INITIAL  5.  Pt will report no low back pain greater than 3/10 with prolonged standing or household chores.  Baseline:  Goal status: INITIAL    PLAN:  PT FREQUENCY: 1-2x/week  PT DURATION: 10 weeks  PLANNED INTERVENTIONS: Therapeutic exercises, Therapeutic activity, Neuromuscular re-education, Balance training, Gait training, Patient/Family education, Self Care, Joint mobilization, Dry Needling, Biofeedback, and Manual therapy  PLAN FOR NEXT SESSION: Revisit pelvic floor contraction training; begin core strengthening.    Heather Roberts, PT, DPT03/20/244:39 PM

## 2022-06-24 NOTE — Patient Instructions (Signed)
Squatty potty: When your knees are level or below the level of your hips, pelvic floor muscles are pressed against rectum, preventing ease of bowel movement. By getting knees above the level of the hips, these pelvic floor muscles relax, allowing easier passage of bowel movement. Ways to get knees above hips: o Squatty Potty (7inch and 9inch versions) o Small stool o Roll of toilet paper under each foot o Hardback book or stack of magazines under each foot  Relaxed Toileting mechanics: Once in this position, make sure to lean forward with forearms on thighs, wide knees, relaxed stomach, and breathe.   Double-voiding:  This technique is to help with post-void dribbling, or leaking a little bit when you stand up right after urinating.  Use relaxed toileting mechanics to urinate as much as you feel like you have to without straining.  Sit back upright from leaning forward and relax this way for 10-20 seconds.  Lean forward again to finish voiding any amount more.    [facility] [address], [place], [place] [address] Phone # [phone] Fax [phone]

## 2022-06-29 ENCOUNTER — Ambulatory Visit: Payer: 59

## 2022-06-29 DIAGNOSIS — R279 Unspecified lack of coordination: Secondary | ICD-10-CM

## 2022-06-29 DIAGNOSIS — R293 Abnormal posture: Secondary | ICD-10-CM

## 2022-06-29 DIAGNOSIS — M25552 Pain in left hip: Secondary | ICD-10-CM

## 2022-06-29 DIAGNOSIS — R3915 Urgency of urination: Secondary | ICD-10-CM

## 2022-06-29 DIAGNOSIS — M6281 Muscle weakness (generalized): Secondary | ICD-10-CM

## 2022-06-29 DIAGNOSIS — N8110 Cystocele, unspecified: Secondary | ICD-10-CM | POA: Diagnosis not present

## 2022-06-29 DIAGNOSIS — R151 Fecal smearing: Secondary | ICD-10-CM

## 2022-06-29 NOTE — Therapy (Signed)
OUTPATIENT PHYSICAL THERAPY TREATMENT NOTE   Patient Name: [name] [name] MRN: [id number] DOB:1950/05/05, 72 y.o., female Today's Date: 06/29/2022  PCP: Audley Hose, MD REFERRING PROVIDER: Megan Salon, MD  END OF SESSION:   PT End of Session - 06/29/22 1523     Visit Number 2    Date for PT Re-Evaluation 09/02/22    Authorization Type [employer] Medicare    PT Start Time [id number]    PT Stop Time 1610    PT Time Calculation (min) 40 min             Past Medical History:  Diagnosis Date   Allergy    Anemia    Arthritis    Asthma    as child only   Back pain    Diabetes mellitus without complication ([place])    Hypertension    Hypothyroidism    Past Surgical History:  Procedure Laterality Date   ABDOMINAL HYSTERECTOMY  1989   fibroid-no longer needs pap   [place]   DDD   CARPAL TUNNEL RELEASE Left 05/28/2016   Procedure: CARPAL TUNNEL RELEASE;  Surgeon: Daryll Brod, MD;  Location: [facility];  Service: Orthopedics;  Laterality: Left;   CARPAL TUNNEL RELEASE Right 03/17/2018   Procedure: CARPAL TUNNEL RELEASE;  Surgeon: Daryll Brod, MD;  Location: [facility];  Service: Orthopedics;  Laterality: Right;   TOTAL HIP ARTHROPLASTY Right 11/18/2018   Procedure: RIGHT TOTAL HIP ARTHROPLASTY ANTERIOR APPROACH;  Surgeon: Mcarthur Rossetti, MD;  Location[facility];  Service: Orthopedics;  Laterality: Right;   TOTAL HIP ARTHROPLASTY Left 01/10/2021   Procedure: LEFT TOTAL HIP ARTHROPLASTY ANTERIOR APPROACH;  Surgeon: Mcarthur Rossetti, MD;  Location[facility];  Service: Orthopedics;  Laterality: Left;   TUBAL LIGATION     Patient Active Problem List   Diagnosis Date Noted   Facet arthropathy, lumbosacral 06/11/2022   Primary osteoarthritis of left shoulder 01/14/2022   Status post left hip replacement 01/10/2021   Vitamin D deficiency 06/27/2020   Peripheral sensory neuropathy due to type 2 diabetes mellitus ([place])  12/01/2019   Mild intermittent asthma 12/01/2019   Myositis 12/01/2019   Status post total replacement of right hip 11/18/2018   Spinal stenosis of lumbar region with neurogenic claudication 04/20/2018   Impingement syndrome of right shoulder 04/20/2018   Type II diabetes mellitus, uncontrolled 11/05/2017   Mixed hyperlipidemia 06/04/2017   Acquired hypothyroidism 03/05/2017   Normocytic anemia 01/24/2017   Bilateral carpal tunnel syndrome 05/06/2016   Essential hypertension, benign 02/20/2016   Primary osteoarthritis of left knee 05/24/2014   Elevated blood pressure reading 09/29/2011   Chronic back pain 09/29/2011   Leg edema 09/29/2011   Obesity with body mass index 30 or greater 09/29/2011    REFERRING DIAG: N81.10 (ICD-10-CM) - Female cystocele R15.1 (ICD-10-CM) - Fecal smearing  THERAPY DIAG:  Abnormal posture  Muscle weakness (generalized)  Unspecified lack of coordination  Fecal smearing  Urinary urgency  Pain in left hip  Rationale for Evaluation and Treatment Rehabilitation  PERTINENT HISTORY: Hysterectomy, bil THA, tubal ligation  PRECAUTIONS: NA  SUBJECTIVE:  SUBJECTIVE STATEMENT: She feels like when she does the exercises, she cannot tell if she is doing the exercises right or wrong. She feels like she is having fecal smearing less often between trips to the bathroom. She thinks double voiding is very helpful and has not had any dribbling since last visit.    PAIN:  Are you having pain? Yes NPRS scale: 8/10 Pain location: low back pain  Pain type: aching and tight Pain description: constant   Aggravating factors: standing, washing dishes Relieving factors: epidural injections in low back 3 weeks ago, sitting  SUBJECTIVE:                                                                                                                                                                                            06/24/22 SUBJECTIVE STATEMENT: Pt states that about 4 months ago she started having sensation of bulging in vagina. She notices that things are different when she is urinating - the sound is different. She is unsure when she is finished.  Fluid intake: Yes: patient states that she does not drink much water, but is working on it   PAIN:  Are you having pain? Yes NPRS scale: 8/10 Pain location: low back pain  Pain type: aching and tight Pain description: constant   Aggravating factors: standing, washing dishes Relieving factors: epidural injections in low back 3 weeks ago, sitting  PRECAUTIONS: None  WEIGHT BEARING RESTRICTIONS: No  FALLS:  Has patient fallen in last 6 months? No  LIVING ENVIRONMENT: Lives with: lives alone Lives in: House/apartment  OCCUPATION: retired  PLOF: Independent  PATIENT GOALS: to fix prolapse and decrease low back pain  PERTINENT HISTORY:  Hysterectomy, bil THA, tubal ligation  Sexual abuse: No  BOWEL MOVEMENT: Pain with bowel movement: No Type of bowel movement:Frequency lately 1x/day and Strain Yes Fully empty rectum: Yes: - Leakage: Yes: fecal smearing; she'll wipe clean after bowel movement and then have smearing when she goes to the bathroom next Pads: No Fiber supplement: No  URINATION: Pain with urination: No Fully empty bladder: Yes: but she does have post-void dribbling; she can also sit there and more will come Stream: inconsistent Urgency: Yes: sometimes it hits her and she has to go immediately Frequency: every hour during the day; 2-3x/night Leakage: none Pads: No  INTERCOURSE: Pain with intercourse: not sexually active, no history of pain   PREGNANCY: Vaginal deliveries 1 Tearing No   PROLAPSE: Cystocele vaginal bulging confirmed by MD   OBJECTIVE:   06/24/22: COGNITION: Overall cognitive status: Within functional limits for tasks assessed     SENSATION: Light touch: Appears intact  Proprioception: Appears intact   GAIT: Comments: Forward flexion/decreased bil hip extension  POSTURE: rounded shoulders, forward head, decreased lumbar lordosis, increased thoracic kyphosis, and posterior pelvic tilt  PALPATION:   General  no abdominal tenderness, but restriction, most notably in Lt lower quadrant                External Perineal Exam anterior vaginal [name] visible; dryness vaginally; hemorrhoids externally                             Internal Pelvic Floor dryness; pt reported sharpness; stool present in rectum  Patient confirms identification and approves PT to assess internal pelvic floor and treatment Yes  PELVIC MMT:   MMT eval  Vaginal 1/5, poor coordination - consistently bearing down with squeeze  Internal Anal Sphincter 1/5  External Anal Sphincter 2/5  Puborectalis 2/5  Diastasis Recti 2 finger width separation with mild distortion with increased abdominal pressure  (Blank rows = not tested)        TONE: WNL  PROLAPSE: Grade 3 anterior vaginal [place] laxity  TODAY'S TREATMENT:                                                                                                                              DATE:   06/29/22 Neuromuscular re-education: Pt provides verbal consent for internal vaginal/rectal pelvic floor exam. Pelvic floor contraction training with internal vaginal feedback Pelvic floor quick flicks 2 x 10  Seated hip adduction 2 x 10 Seated hip abduction 2 x 10 yellow band Seated hip internal rotation 2 x 10 yellow band Exercises: Pelvic tilts 2 x 10 Seated piriformis stretch 60 sec bil   06/24/22: EVAL  Neuromuscular re-education: Pt provides verbal consent for internal vaginal/rectal pelvic floor exam. Pelvic floor contraction training quick flicks vaginally/rectally Therapeutic  activities: Squatty potty Relaxed toilet mechanics Double voiding     PATIENT EDUCATION:  Education details: see above Person educated: Patient Education method: Consulting civil engineer, Demonstration, Tactile cues, Verbal cues, and Handouts Education comprehension: verbalized understanding  HOME EXERCISE PROGRAM: [id number]  ASSESSMENT:  CLINICAL IMPRESSION: Pt has seen some great progress since her first visit last week demonstrated by a decrease in fecal smearing and no post-void dribbling. Due to uncertainty if she is performing contractions correctly or not, we performed internal vaginal training of pelvic floor quick flicks again today. Initially she was having difficulty with performing correct contraction, but good progress with multimodal cues during exam. She was able to correct and perform automatically with exhale. We performed gentle hip strengthening exercises to help facilitate and strengthen pelvic floor. She will continue to benefit from skilled PT intervention in order to strengthen pelvic floor, learn appropriate pressure management techniques, decrease fecal smearing, improve low back pain, and begin/progress functional strengthening program.   OBJECTIVE IMPAIRMENTS: decreased activity tolerance, decreased coordination, decreased endurance, decreased strength, increased fascial restrictions, increased muscle spasms, postural dysfunction, and  pain.   ACTIVITY LIMITATIONS: lifting, bending, standing, transfers, bed mobility, and continence  PARTICIPATION LIMITATIONS: cleaning and community activity  PERSONAL FACTORS: 1 comorbidity: medical history are also affecting patient's functional outcome.   REHAB POTENTIAL: Good  CLINICAL DECISION MAKING: Stable/uncomplicated  EVALUATION COMPLEXITY: Low   GOALS: Goals reviewed with patient? Yes  SHORT TERM GOALS: Target date: 07/29/22  Pt will be independent with HEP.   Baseline: Goal status: INITIAL  2.  Pt will be independent  with use of squatty potty, relaxed toileting mechanics, and improved bowel movement techniques in order to increase ease of bowel movements and complete evacuation.   Baseline:  Goal status: INITIAL  3.  Pt will be independent with double voiding in order to completely empty bladder when urinating. Baseline:  Goal status: INITIAL    LONG TERM GOALS: Target date: 12/09/22  Pt will be independent with advanced HEP.   Baseline:  Goal status: INITIAL  2.  Pt will be able to correctly perform diaphragmatic breathing and appropriate pressure management in order to prevent worsening vaginal Schechter laxity and improve pelvic floor A/ROM.   Baseline:  Goal status: INITIAL  3.  Pt will demonstrate normal pelvic floor muscle tone and A/ROM, able to achieve 3/5 strength with contractions and 10 sec endurance, in order to provide appropriate lumbopelvic support in functional activities.   Baseline:  Goal status: INITIAL  4.  Pt will report no episodes of fecal incontinence or post-void dribbling in order to improve confidence in community activities and personal hygiene.   Baseline:  Goal status: INITIAL  5.  Pt will report no low back pain greater than 3/10 with prolonged standing or household chores.  Baseline:  Goal status: INITIAL    PLAN:  PT FREQUENCY: 1-2x/week  PT DURATION: 10 weeks  PLANNED INTERVENTIONS: Therapeutic exercises, Therapeutic activity, Neuromuscular re-education, Balance training, Gait training, Patient/Family education, Self Care, Joint mobilization, Dry Needling, Biofeedback, and Manual therapy  PLAN FOR NEXT SESSION: Revisit pelvic floor contraction training; begin core strengthening.   Heather Roberts, PT, DPT03/25/244:14 PM

## 2022-06-30 ENCOUNTER — Ambulatory Visit: Payer: 59 | Admitting: Orthopaedic Surgery

## 2022-07-07 ENCOUNTER — Ambulatory Visit: Payer: 59

## 2022-07-11 ENCOUNTER — Other Ambulatory Visit: Payer: Self-pay | Admitting: Orthopaedic Surgery

## 2022-07-20 ENCOUNTER — Encounter: Payer: Self-pay | Admitting: *Deleted

## 2022-08-04 ENCOUNTER — Ambulatory Visit: Payer: 59 | Attending: Obstetrics & Gynecology

## 2022-08-04 DIAGNOSIS — R279 Unspecified lack of coordination: Secondary | ICD-10-CM

## 2022-08-04 DIAGNOSIS — M25552 Pain in left hip: Secondary | ICD-10-CM | POA: Diagnosis present

## 2022-08-04 DIAGNOSIS — R3915 Urgency of urination: Secondary | ICD-10-CM | POA: Diagnosis present

## 2022-08-04 DIAGNOSIS — M6281 Muscle weakness (generalized): Secondary | ICD-10-CM

## 2022-08-04 DIAGNOSIS — R151 Fecal smearing: Secondary | ICD-10-CM | POA: Diagnosis present

## 2022-08-04 DIAGNOSIS — R293 Abnormal posture: Secondary | ICD-10-CM | POA: Diagnosis present

## 2022-08-04 NOTE — Patient Instructions (Signed)
Relieving pelvic pressure: Lying in an inverted position can help relieve the sensation of pelvic pressure Your legs can be up the [place], on a chair, or over the couch; the important thing is to make sure you have hips are elevated Once you are lying on your back, place a pillow or 2 under your hips to elevate the pelvis  Perform this position: When you feel pressure throughout the day At the end of the day While in this position, perform deep, diaphragmatic breathing to help all of your muscles relax

## 2022-08-04 NOTE — Therapy (Signed)
OUTPATIENT PHYSICAL THERAPY TREATMENT NOTE   Patient Name: [name] [name] MRN: [id number] DOB:03-05-1951, 72 y.o., female Today's Date: 08/04/2022  PCP: Harvest Forest, MD REFERRING PROVIDER: Jerene Bears, MD  END OF SESSION:   PT End of Session - 08/04/22 1346     Visit Number 3    Date for PT Re-Evaluation 09/02/22    Authorization Type [employer] Medicare    PT Start Time 1400    PT Stop Time 1440    PT Time Calculation (min) 40 min    Activity Tolerance Patient tolerated treatment well    Behavior During Therapy WFL for tasks assessed/performed             Past Medical History:  Diagnosis Date   Allergy    Anemia    Arthritis    Asthma    as child only   Back pain    Diabetes mellitus without complication (HCC)    Hypertension    Hypothyroidism    Past Surgical History:  Procedure Laterality Date   ABDOMINAL HYSTERECTOMY  1989   fibroid-no longer needs pap   BACK SURGERY  1987, 1985   DDD   CARPAL TUNNEL RELEASE Left 05/28/2016   Procedure: CARPAL TUNNEL RELEASE;  Surgeon: Cindee Salt, MD;  Location: [facility];  Service: Orthopedics;  Laterality: Left;   CARPAL TUNNEL RELEASE Right 03/17/2018   Procedure: CARPAL TUNNEL RELEASE;  Surgeon: Cindee Salt, MD;  Location: [facility];  Service: Orthopedics;  Laterality: Right;   TOTAL HIP ARTHROPLASTY Right 11/18/2018   Procedure: RIGHT TOTAL HIP ARTHROPLASTY ANTERIOR APPROACH;  Surgeon: Kathryne Hitch, MD;  Location: [facility];  Service: Orthopedics;  Laterality: Right;   TOTAL HIP ARTHROPLASTY Left 01/10/2021   Procedure: LEFT TOTAL HIP ARTHROPLASTY ANTERIOR APPROACH;  Surgeon: Kathryne Hitch, MD;  Location: [facility];  Service: Orthopedics;  Laterality: Left;   TUBAL LIGATION     Patient Active Problem List   Diagnosis Date Noted   Facet arthropathy, lumbosacral 06/11/2022   Primary osteoarthritis of left shoulder 01/14/2022   Status post left hip replacement  01/10/2021   Vitamin D deficiency 06/27/2020   Peripheral sensory neuropathy due to type 2 diabetes mellitus (HCC) 12/01/2019   Mild intermittent asthma 12/01/2019   Myositis 12/01/2019   Status post total replacement of right hip 11/18/2018   Spinal stenosis of lumbar region with neurogenic claudication 04/20/2018   Impingement syndrome of right shoulder 04/20/2018   Type II diabetes mellitus, uncontrolled 11/05/2017   Mixed hyperlipidemia 06/04/2017   Acquired hypothyroidism 03/05/2017   Normocytic anemia 01/24/2017   Bilateral carpal tunnel syndrome 05/06/2016   Essential hypertension, benign 02/20/2016   Primary osteoarthritis of left knee 05/24/2014   Elevated blood pressure reading 09/29/2011   Chronic back pain 09/29/2011   Leg edema 09/29/2011   Obesity with body mass index 30 or greater 09/29/2011    REFERRING DIAG: N81.10 (ICD-10-CM) - Female cystocele R15.1 (ICD-10-CM) - Fecal smearing  THERAPY DIAG:  Abnormal posture  Muscle weakness (generalized)  Unspecified lack of coordination  Pain in left hip  Urinary urgency  Fecal smearing  Rationale for Evaluation and Treatment Rehabilitation  PERTINENT HISTORY: Hysterectomy, bil THA, tubal ligation  PRECAUTIONS: NA  SUBJECTIVE:  SUBJECTIVE STATEMENT: Pt states that she had significant vaginal pressure/bulge Friday that was very concerning. It was mildly better over the weekend. We spoke on the phone yesterday and discussed trying inverted lying. She did this last night with good improvement.    PAIN:  Are you having pain? Yes NPRS scale: 8/10 Pain location: low back pain  Pain type: aching and tight Pain description: constant   Aggravating factors: standing, washing dishes Relieving factors: epidural injections in low back 3  weeks ago, sitting  SUBJECTIVE:                                                                                                                                                                                           06/24/22 SUBJECTIVE STATEMENT: Pt states that about 4 months ago she started having sensation of bulging in vagina. She notices that things are different when she is urinating - the sound is different. She is unsure when she is finished.  Fluid intake: Yes: patient states that she does not drink much water, but is working on it   PAIN:  Are you having pain? Yes NPRS scale: 8/10 Pain location: low back pain  Pain type: aching and tight Pain description: constant   Aggravating factors: standing, washing dishes Relieving factors: epidural injections in low back 3 weeks ago, sitting  PRECAUTIONS: None  WEIGHT BEARING RESTRICTIONS: No  FALLS:  Has patient fallen in last 6 months? No  LIVING ENVIRONMENT: Lives with: lives alone Lives in: House/apartment  OCCUPATION: retired  PLOF: Independent  PATIENT GOALS: to fix prolapse and decrease low back pain  PERTINENT HISTORY:  Hysterectomy, bil THA, tubal ligation  Sexual abuse: No  BOWEL MOVEMENT: Pain with bowel movement: No Type of bowel movement:Frequency lately 1x/day and Strain Yes Fully empty rectum: Yes: - Leakage: Yes: fecal smearing; she'll wipe clean after bowel movement and then have smearing when she goes to the bathroom next Pads: No Fiber supplement: No  URINATION: Pain with urination: No Fully empty bladder: Yes: but she does have post-void dribbling; she can also sit there and more will come Stream: inconsistent Urgency: Yes: sometimes it hits her and she has to go immediately Frequency: every hour during the day; 2-3x/night Leakage: none Pads: No  INTERCOURSE: Pain with intercourse: not sexually active, no history of pain   PREGNANCY: Vaginal deliveries 1 Tearing  No   PROLAPSE: Cystocele vaginal bulging confirmed by MD   OBJECTIVE:  06/24/22: COGNITION: Overall cognitive status: Within functional limits for tasks assessed     SENSATION: Light touch: Appears intact Proprioception: Appears intact   GAIT: Comments: Forward flexion/decreased bil hip extension  POSTURE: rounded shoulders, forward head, decreased lumbar lordosis, increased thoracic kyphosis, and posterior pelvic tilt  PALPATION:   General  no abdominal tenderness, but restriction, most notably in Lt lower quadrant                External Perineal Exam anterior vaginal Bonello visible; dryness vaginally; hemorrhoids externally                             Internal Pelvic Floor dryness; pt reported sharpness; stool present in rectum  Patient confirms identification and approves PT to assess internal pelvic floor and treatment Yes  PELVIC MMT:   MMT eval  Vaginal 1/5, poor coordination - consistently bearing down with squeeze  Internal Anal Sphincter 1/5  External Anal Sphincter 2/5  Puborectalis 2/5  Diastasis Recti 2 finger width separation with mild distortion with increased abdominal pressure  (Blank rows = not tested)        TONE: WNL  PROLAPSE: Grade 3 anterior vaginal Lerner laxity  TODAY'S TREATMENT:                                                                                                                              DATE:   08/04/22 Neuromuscular re-education: Pelvic floor contractions in inverted lying position Long holds 5x Quick flicks 10x Bridge with hip adduction 2 x 10 Transversus abdominus training with multimodal cues for improved motor control and breath coordination Supine UE ball press 2 x 10 Sidelying UE ball press 10x bil Supine march 2 x 10 Exercises: Lower trunk rotation 2 x 10 Piriformis stretch Open books 10x bil Therapeutic activities: Inverted lying for prolapse reduction Pressure management    06/29/22 Neuromuscular  re-education: Pt provides verbal consent for internal vaginal/rectal pelvic floor exam. Pelvic floor contraction training with internal vaginal feedback Pelvic floor quick flicks 2 x 10  Seated hip adduction 2 x 10 Seated hip abduction 2 x 10 yellow band Seated hip internal rotation 2 x 10 yellow band Exercises: Pelvic tilts 2 x 10 Seated piriformis stretch 60 sec bil   06/24/22: EVAL  Neuromuscular re-education: Pt provides verbal consent for internal vaginal/rectal pelvic floor exam. Pelvic floor contraction training quick flicks vaginally/rectally Therapeutic activities: Squatty potty Relaxed toilet mechanics Double voiding     PATIENT EDUCATION:  Education details: see above Person educated: Patient Education method: Programmer, multimedia, Demonstration, Tactile cues, Verbal cues, and Handouts Education comprehension: verbalized understanding  HOME EXERCISE PROGRAM: [id number]  ASSESSMENT:  CLINICAL IMPRESSION: Pt realized that Fridays, when she had increased sensation of vaginal bulge that was scary, she also has her heaviest work day with lots of cleaning/vacuuming. We discussed that this easily could increase symptoms of vaginal Shearman laxity. We performed inverted lying together in session with pelvic floor contractions; she reported that it felt comfortable. Core training performed with significant difficulty; she had trouble not bearing down and finding correct muscles. She was able  to correct with multimodal cues. Once she was able to find correct, she did very well incorporating into other activities. She will continue to benefit from skilled PT intervention in order to strengthen pelvic floor, learn appropriate pressure management techniques, decrease fecal smearing, improve low back pain, and begin/progress functional strengthening program.   OBJECTIVE IMPAIRMENTS: decreased activity tolerance, decreased coordination, decreased endurance, decreased strength, increased fascial  restrictions, increased muscle spasms, postural dysfunction, and pain.   ACTIVITY LIMITATIONS: lifting, bending, standing, transfers, bed mobility, and continence  PARTICIPATION LIMITATIONS: cleaning and community activity  PERSONAL FACTORS: 1 comorbidity: medical history are also affecting patient's functional outcome.   REHAB POTENTIAL: Good  CLINICAL DECISION MAKING: Stable/uncomplicated  EVALUATION COMPLEXITY: Low   GOALS: Goals reviewed with patient? Yes  SHORT TERM GOALS: Target date: 07/29/22 - updated 08/04/22  Pt will be independent with HEP.   Baseline: Goal status: Met 08/04/22  2.  Pt will be independent with use of squatty potty, relaxed toileting mechanics, and improved bowel movement techniques in order to increase ease of bowel movements and complete evacuation.   Baseline:  Goal status: IN PROGRESS  3.  Pt will be independent with double voiding in order to completely empty bladder when urinating. Baseline:  Goal status: IN PROGRESS    LONG TERM GOALS: Target date: 12/09/22  Pt will be independent with advanced HEP.   Baseline:  Goal status: IN PROGRESS  2.  Pt will be able to correctly perform diaphragmatic breathing and appropriate pressure management in order to prevent worsening vaginal Huge laxity and improve pelvic floor A/ROM.   Baseline:  Goal status: IN PROGRESS  3.  Pt will demonstrate normal pelvic floor muscle tone and A/ROM, able to achieve 3/5 strength with contractions and 10 sec endurance, in order to provide appropriate lumbopelvic support in functional activities.   Baseline:  Goal status:IN PROGRESS  4.  Pt will report no episodes of fecal incontinence or post-void dribbling in order to improve confidence in community activities and personal hygiene.   Baseline:  Goal status: IN PROGRESS  5.  Pt will report no low back pain greater than 3/10 with prolonged standing or household chores.  Baseline:  Goal status: IN  PROGRESS    PLAN:  PT FREQUENCY: 1-2x/week  PT DURATION: 10 weeks  PLANNED INTERVENTIONS: Therapeutic exercises, Therapeutic activity, Neuromuscular re-education, Balance training, Gait training, Patient/Family education, Self Care, Joint mobilization, Dry Needling, Biofeedback, and Manual therapy  PLAN FOR NEXT SESSION: Review all exercises and update HEP.   Julio Alm, PT, DPT04/30/242:43 PM

## 2022-08-11 ENCOUNTER — Ambulatory Visit: Payer: 59 | Attending: Obstetrics & Gynecology

## 2022-08-11 DIAGNOSIS — M25552 Pain in left hip: Secondary | ICD-10-CM

## 2022-08-11 DIAGNOSIS — R293 Abnormal posture: Secondary | ICD-10-CM | POA: Insufficient documentation

## 2022-08-11 DIAGNOSIS — M6281 Muscle weakness (generalized): Secondary | ICD-10-CM

## 2022-08-11 DIAGNOSIS — R3915 Urgency of urination: Secondary | ICD-10-CM | POA: Diagnosis present

## 2022-08-11 DIAGNOSIS — R151 Fecal smearing: Secondary | ICD-10-CM | POA: Insufficient documentation

## 2022-08-11 DIAGNOSIS — R2689 Other abnormalities of gait and mobility: Secondary | ICD-10-CM | POA: Diagnosis present

## 2022-08-11 DIAGNOSIS — R279 Unspecified lack of coordination: Secondary | ICD-10-CM | POA: Diagnosis present

## 2022-08-11 NOTE — Patient Instructions (Signed)

## 2022-08-11 NOTE — Therapy (Signed)
OUTPATIENT PHYSICAL THERAPY TREATMENT NOTE   Patient Name: [name] [name] MRN: [id number] DOB:11-17-1950, 72 y.o., female Today's Date: 08/11/2022  PCP: Harvest Forest, MD REFERRING PROVIDER: Jerene Bears, MD  END OF SESSION:   PT End of Session - 08/11/22 1356     Visit Number 4    Date for PT Re-Evaluation 09/02/22    Authorization Type [employer] Medicare    PT Start Time 1400    PT Stop Time 1440    PT Time Calculation (min) 40 min    Activity Tolerance Patient tolerated treatment well    Behavior During Therapy WFL for tasks assessed/performed              Past Medical History:  Diagnosis Date   Allergy    Anemia    Arthritis    Asthma    as child only   Back pain    Diabetes mellitus without complication (HCC)    Hypertension    Hypothyroidism    Past Surgical History:  Procedure Laterality Date   ABDOMINAL HYSTERECTOMY  1989   fibroid-no longer needs pap   BACK SURGERY  1987, 1985   DDD   CARPAL TUNNEL RELEASE Left 05/28/2016   Procedure: CARPAL TUNNEL RELEASE;  Surgeon: Cindee Salt, MD;  Location: [facility];  Service: Orthopedics;  Laterality: Left;   CARPAL TUNNEL RELEASE Right 03/17/2018   Procedure: CARPAL TUNNEL RELEASE;  Surgeon: Cindee Salt, MD;  Location: [facility];  Service: Orthopedics;  Laterality: Right;   TOTAL HIP ARTHROPLASTY Right 11/18/2018   Procedure: RIGHT TOTAL HIP ARTHROPLASTY ANTERIOR APPROACH;  Surgeon: Kathryne Hitch, MD;  Location: [facility];  Service: Orthopedics;  Laterality: Right;   TOTAL HIP ARTHROPLASTY Left 01/10/2021   Procedure: LEFT TOTAL HIP ARTHROPLASTY ANTERIOR APPROACH;  Surgeon: Kathryne Hitch, MD;  Location: [facility];  Service: Orthopedics;  Laterality: Left;   TUBAL LIGATION     Patient Active Problem List   Diagnosis Date Noted   Facet arthropathy, lumbosacral 06/11/2022   Primary osteoarthritis of left shoulder 01/14/2022   Status post left hip replacement  01/10/2021   Vitamin D deficiency 06/27/2020   Peripheral sensory neuropathy due to type 2 diabetes mellitus (HCC) 12/01/2019   Mild intermittent asthma 12/01/2019   Myositis 12/01/2019   Status post total replacement of right hip 11/18/2018   Spinal stenosis of lumbar region with neurogenic claudication 04/20/2018   Impingement syndrome of right shoulder 04/20/2018   Type II diabetes mellitus, uncontrolled 11/05/2017   Mixed hyperlipidemia 06/04/2017   Acquired hypothyroidism 03/05/2017   Normocytic anemia 01/24/2017   Bilateral carpal tunnel syndrome 05/06/2016   Essential hypertension, benign 02/20/2016   Primary osteoarthritis of left knee 05/24/2014   Elevated blood pressure reading 09/29/2011   Chronic back pain 09/29/2011   Leg edema 09/29/2011   Obesity with body mass index 30 or greater 09/29/2011    REFERRING DIAG: N81.10 (ICD-10-CM) - Female cystocele R15.1 (ICD-10-CM) - Fecal smearing  THERAPY DIAG:  Abnormal posture  Muscle weakness (generalized)  Unspecified lack of coordination  Pain in left hip  Urinary urgency  Fecal smearing  Rationale for Evaluation and Treatment Rehabilitation  PERTINENT HISTORY: Hysterectomy, bil THA, tubal ligation  PRECAUTIONS: NA  SUBJECTIVE:  SUBJECTIVE STATEMENT: Pt states that she has not had any more days feeling the vaginal bulge. She has decided that she is only going to drink water today and see if that helps anything. She states that fecal smearing is improving and she is not finding as much when she returns to the bathroom after having bowel movement.    PAIN:  Are you having pain? Yes NPRS scale: 6/10 Pain location: low back pain  Pain type: aching and tight Pain description: constant   Aggravating factors: standing, washing  dishes Relieving factors: epidural injections in low back 3 weeks ago, sitting  SUBJECTIVE:                                                                                                                                                                                           06/24/22 SUBJECTIVE STATEMENT: Pt states that about 4 months ago she started having sensation of bulging in vagina. She notices that things are different when she is urinating - the sound is different. She is unsure when she is finished.  Fluid intake: Yes: patient states that she does not drink much water, but is working on it   PAIN:  Are you having pain? Yes NPRS scale: 8/10 Pain location: low back pain  Pain type: aching and tight Pain description: constant   Aggravating factors: standing, washing dishes Relieving factors: epidural injections in low back 3 weeks ago, sitting  PRECAUTIONS: None  WEIGHT BEARING RESTRICTIONS: No  FALLS:  Has patient fallen in last 6 months? No  LIVING ENVIRONMENT: Lives with: lives alone Lives in: House/apartment  OCCUPATION: retired  PLOF: Independent  PATIENT GOALS: to fix prolapse and decrease low back pain  PERTINENT HISTORY:  Hysterectomy, bil THA, tubal ligation  Sexual abuse: No  BOWEL MOVEMENT: Pain with bowel movement: No Type of bowel movement:Frequency lately 1x/day and Strain Yes Fully empty rectum: Yes: - Leakage: Yes: fecal smearing; she'll wipe clean after bowel movement and then have smearing when she goes to the bathroom next Pads: No Fiber supplement: No  URINATION: Pain with urination: No Fully empty bladder: Yes: but she does have post-void dribbling; she can also sit there and more will come Stream: inconsistent Urgency: Yes: sometimes it hits her and she has to go immediately Frequency: every hour during the day; 2-3x/night Leakage: none Pads: No  INTERCOURSE: Pain with intercourse: not sexually active, no history of  pain   PREGNANCY: Vaginal deliveries 1 Tearing No   PROLAPSE: Cystocele vaginal bulging confirmed by MD   OBJECTIVE:  06/24/22: COGNITION: Overall cognitive status: Within functional limits for tasks assessed  SENSATION: Light touch: Appears intact Proprioception: Appears intact   GAIT: Comments: Forward flexion/decreased bil hip extension  POSTURE: rounded shoulders, forward head, decreased lumbar lordosis, increased thoracic kyphosis, and posterior pelvic tilt  PALPATION:   General  no abdominal tenderness, but restriction, most notably in Lt lower quadrant                External Perineal Exam anterior vaginal Heaps visible; dryness vaginally; hemorrhoids externally                             Internal Pelvic Floor dryness; pt reported sharpness; stool present in rectum  Patient confirms identification and approves PT to assess internal pelvic floor and treatment Yes  PELVIC MMT:   MMT eval  Vaginal 1/5, poor coordination - consistently bearing down with squeeze  Internal Anal Sphincter 1/5  External Anal Sphincter 2/5  Puborectalis 2/5  Diastasis Recti 2 finger width separation with mild distortion with increased abdominal pressure  (Blank rows = not tested)        TONE: WNL  PROLAPSE: Grade 3 anterior vaginal Raine laxity  TODAY'S TREATMENT:                                                                                                                              DATE:   08/11/22 Neuromuscular re-education: Seated march with anterior hold 2lb 3 x 10 Standing march with anterior hold 2lb 3 x 10 Seated chop with hip adduction  2lb 10x bil Standing chop 2lb 10x bil Exercises: Seated dead lift 2 x 10 10lbs 3-way kick 10x each, bil Therapeutic activities: Urge drill/voiding window Timing of nightly fluids   08/04/22 Neuromuscular re-education: Pelvic floor contractions in inverted lying position Long holds 5x Quick flicks 10x Bridge with hip  adduction 2 x 10 Transversus abdominus training with multimodal cues for improved motor control and breath coordination Supine UE ball press 2 x 10 Sidelying UE ball press 10x bil Supine march 2 x 10 Exercises: Lower trunk rotation 2 x 10 Piriformis stretch Open books 10x bil Therapeutic activities: Inverted lying for prolapse reduction Pressure management    06/29/22 Neuromuscular re-education: Pt provides verbal consent for internal vaginal/rectal pelvic floor exam. Pelvic floor contraction training with internal vaginal feedback Pelvic floor quick flicks 2 x 10  Seated hip adduction 2 x 10 Seated hip abduction 2 x 10 yellow band Seated hip internal rotation 2 x 10 yellow band Exercises: Pelvic tilts 2 x 10 Seated piriformis stretch 60 sec bil   PATIENT EDUCATION:  Education details: see above Person educated: Patient Education method: Programmer, multimedia, Demonstration, Tactile cues, Verbal cues, and Handouts Education comprehension: verbalized understanding  HOME EXERCISE PROGRAM: [id number]  ASSESSMENT:  CLINICAL IMPRESSION: Pt does report improvement in urinary and bowel function, but still is having difficulty with nocturia and urgency. We discussed urge drill and nightly timing of fluids. She did well with progressing  core activities into seated and standing exercises with improved breath/pressure management. She will continue to benefit from skilled PT intervention in order to strengthen pelvic floor, learn appropriate pressure management techniques, decrease fecal smearing, improve low back pain, and begin/progress functional strengthening program.   OBJECTIVE IMPAIRMENTS: decreased activity tolerance, decreased coordination, decreased endurance, decreased strength, increased fascial restrictions, increased muscle spasms, postural dysfunction, and pain.   ACTIVITY LIMITATIONS: lifting, bending, standing, transfers, bed mobility, and continence  PARTICIPATION LIMITATIONS:  cleaning and community activity  PERSONAL FACTORS: 1 comorbidity: medical history are also affecting patient's functional outcome.   REHAB POTENTIAL: Good  CLINICAL DECISION MAKING: Stable/uncomplicated  EVALUATION COMPLEXITY: Low   GOALS: Goals reviewed with patient? Yes  SHORT TERM GOALS: Target date: 07/29/22 - updated 08/04/22  Pt will be independent with HEP.   Baseline: Goal status: Met 08/04/22  2.  Pt will be independent with use of squatty potty, relaxed toileting mechanics, and improved bowel movement techniques in order to increase ease of bowel movements and complete evacuation.   Baseline:  Goal status: IN PROGRESS  3.  Pt will be independent with double voiding in order to completely empty bladder when urinating. Baseline:  Goal status: IN PROGRESS    LONG TERM GOALS: Target date: 12/09/22  Pt will be independent with advanced HEP.   Baseline:  Goal status: IN PROGRESS  2.  Pt will be able to correctly perform diaphragmatic breathing and appropriate pressure management in order to prevent worsening vaginal Blazier laxity and improve pelvic floor A/ROM.   Baseline:  Goal status: IN PROGRESS  3.  Pt will demonstrate normal pelvic floor muscle tone and A/ROM, able to achieve 3/5 strength with contractions and 10 sec endurance, in order to provide appropriate lumbopelvic support in functional activities.   Baseline:  Goal status:IN PROGRESS  4.  Pt will report no episodes of fecal incontinence or post-void dribbling in order to improve confidence in community activities and personal hygiene.   Baseline:  Goal status: IN PROGRESS  5.  Pt will report no low back pain greater than 3/10 with prolonged standing or household chores.  Baseline:  Goal status: IN PROGRESS    PLAN:  PT FREQUENCY: 1-2x/week  PT DURATION: 10 weeks  PLANNED INTERVENTIONS: Therapeutic exercises, Therapeutic activity, Neuromuscular re-education, Balance training, Gait training,  Patient/Family education, Self Care, Joint mobilization, Dry Needling, Biofeedback, and Manual therapy  PLAN FOR NEXT SESSION: Review urge drill and voiding schedule to assess for progress  Julio Alm, PT, DPT05/07/242:42 PM

## 2022-08-21 ENCOUNTER — Telehealth: Payer: Self-pay | Admitting: Orthopaedic Surgery

## 2022-08-21 DIAGNOSIS — M5416 Radiculopathy, lumbar region: Secondary | ICD-10-CM

## 2022-08-21 NOTE — Addendum Note (Signed)
Addended by: Rogers Seeds on: 08/21/2022 11:20 AM   Modules accepted: Orders

## 2022-08-21 NOTE — Telephone Encounter (Signed)
Patient asking if Dr. Ophelia Charter can order an MRI for her Lower back.Please call patient

## 2022-08-21 NOTE — Telephone Encounter (Signed)
Please advise 

## 2022-08-21 NOTE — Telephone Encounter (Signed)
I called patient and advised. Explained that she will need to call and schedule follow up visit post MRI with Dr. Ophelia Charter once MRI is scheduled.

## 2022-08-25 ENCOUNTER — Ambulatory Visit: Payer: 59

## 2022-08-25 DIAGNOSIS — R293 Abnormal posture: Secondary | ICD-10-CM

## 2022-08-25 DIAGNOSIS — R2689 Other abnormalities of gait and mobility: Secondary | ICD-10-CM

## 2022-08-25 DIAGNOSIS — M25552 Pain in left hip: Secondary | ICD-10-CM

## 2022-08-25 DIAGNOSIS — R3915 Urgency of urination: Secondary | ICD-10-CM

## 2022-08-25 DIAGNOSIS — R151 Fecal smearing: Secondary | ICD-10-CM

## 2022-08-25 DIAGNOSIS — R279 Unspecified lack of coordination: Secondary | ICD-10-CM

## 2022-08-25 DIAGNOSIS — M6281 Muscle weakness (generalized): Secondary | ICD-10-CM

## 2022-08-25 NOTE — Therapy (Signed)
OUTPATIENT PHYSICAL THERAPY TREATMENT NOTE   Patient Name: [name] [name] MRN: [id number] DOB:1950-08-09, 72 y.o., female Today's Date: 08/25/2022  PCP: Harvest Forest, MD REFERRING PROVIDER: Jerene Bears, MD  END OF SESSION:   PT End of Session - 08/25/22 1447     Visit Number 5    Date for PT Re-Evaluation 11/03/22    Authorization Type [employer] Medicare    PT Start Time 1445    PT Stop Time 1525    PT Time Calculation (min) 40 min    Activity Tolerance Patient tolerated treatment well    Behavior During Therapy WFL for tasks assessed/performed              Past Medical History:  Diagnosis Date   Allergy    Anemia    Arthritis    Asthma    as child only   Back pain    Diabetes mellitus without complication (HCC)    Hypertension    Hypothyroidism    Past Surgical History:  Procedure Laterality Date   ABDOMINAL HYSTERECTOMY  1989   fibroid-no longer needs pap   BACK SURGERY  1987, 1985   DDD   CARPAL TUNNEL RELEASE Left 05/28/2016   Procedure: CARPAL TUNNEL RELEASE;  Surgeon: Cindee Salt, MD;  Location: [facility];  Service: Orthopedics;  Laterality: Left;   CARPAL TUNNEL RELEASE Right 03/17/2018   Procedure: CARPAL TUNNEL RELEASE;  Surgeon: Cindee Salt, MD;  Location: [facility];  Service: Orthopedics;  Laterality: Right;   TOTAL HIP ARTHROPLASTY Right 11/18/2018   Procedure: RIGHT TOTAL HIP ARTHROPLASTY ANTERIOR APPROACH;  Surgeon: Kathryne Hitch, MD;  Location: [facility];  Service: Orthopedics;  Laterality: Right;   TOTAL HIP ARTHROPLASTY Left 01/10/2021   Procedure: LEFT TOTAL HIP ARTHROPLASTY ANTERIOR APPROACH;  Surgeon: Kathryne Hitch, MD;  Location: [facility];  Service: Orthopedics;  Laterality: Left;   TUBAL LIGATION     Patient Active Problem List   Diagnosis Date Noted   Facet arthropathy, lumbosacral 06/11/2022   Primary osteoarthritis of left shoulder 01/14/2022   Status post left hip replacement  01/10/2021   Vitamin D deficiency 06/27/2020   Peripheral sensory neuropathy due to type 2 diabetes mellitus (HCC) 12/01/2019   Mild intermittent asthma 12/01/2019   Myositis 12/01/2019   Status post total replacement of right hip 11/18/2018   Spinal stenosis of lumbar region with neurogenic claudication 04/20/2018   Impingement syndrome of right shoulder 04/20/2018   Type II diabetes mellitus, uncontrolled 11/05/2017   Mixed hyperlipidemia 06/04/2017   Acquired hypothyroidism 03/05/2017   Normocytic anemia 01/24/2017   Bilateral carpal tunnel syndrome 05/06/2016   Essential hypertension, benign 02/20/2016   Primary osteoarthritis of left knee 05/24/2014   Elevated blood pressure reading 09/29/2011   Chronic back pain 09/29/2011   Leg edema 09/29/2011   Obesity with body mass index 30 or greater 09/29/2011    REFERRING DIAG: N81.10 (ICD-10-CM) - Female cystocele R15.1 (ICD-10-CM) - Fecal smearing  THERAPY DIAG:  Abnormal posture  Muscle weakness (generalized)  Unspecified lack of coordination  Pain in left hip  Urinary urgency  Fecal smearing  Other abnormalities of gait and mobility  Rationale for Evaluation and Treatment Rehabilitation  PERTINENT HISTORY: Hysterectomy, bil THA, tubal ligation  PRECAUTIONS: NA  SUBJECTIVE:  SUBJECTIVE STATEMENT: Pt states that she is noticing good improvement in vaginal [place] bulging. She used to have to push tissue back up into vagina, but has not had to recently. She only woke 1x last night to urinate. Urinary urgency is better and she does not feel like she has to immediately go to the bathroom.    PAIN:  Are you having pain? Yes NPRS scale: 6/10 Pain location: low back pain  Pain type: aching and tight Pain description: constant   Aggravating  factors: standing, washing dishes Relieving factors: epidural injections in low back 3 weeks ago, sitting  SUBJECTIVE:                                                                                                                                                                                           06/24/22 SUBJECTIVE STATEMENT: Pt states that about 4 months ago she started having sensation of bulging in vagina. She notices that things are different when she is urinating - the sound is different. She is unsure when she is finished.  Fluid intake: Yes: patient states that she does not drink much water, but is working on it   PAIN:  Are you having pain? Yes NPRS scale: 8/10 Pain location: low back pain  Pain type: aching and tight Pain description: constant   Aggravating factors: standing, washing dishes Relieving factors: epidural injections in low back 3 weeks ago, sitting  PRECAUTIONS: None  WEIGHT BEARING RESTRICTIONS: No  FALLS:  Has patient fallen in last 6 months? No  LIVING ENVIRONMENT: Lives with: lives alone Lives in: House/apartment  OCCUPATION: retired  PLOF: Independent  PATIENT GOALS: to fix prolapse and decrease low back pain  PERTINENT HISTORY:  Hysterectomy, bil THA, tubal ligation  Sexual abuse: No  BOWEL MOVEMENT: Pain with bowel movement: No Type of bowel movement:Frequency lately 1x/day and Strain Yes Fully empty rectum: Yes: - Leakage: Yes: fecal smearing; she'll wipe clean after bowel movement and then have smearing when she goes to the bathroom next Pads: No Fiber supplement: No  URINATION: Pain with urination: No Fully empty bladder: Yes: but she does have post-void dribbling; she can also sit there and more will come Stream: inconsistent Urgency: Yes: sometimes it hits her and she has to go immediately Frequency: every hour during the day; 2-3x/night Leakage: none Pads: No  INTERCOURSE: Pain with intercourse: not sexually active,  no history of pain   PREGNANCY: Vaginal deliveries 1 Tearing No   PROLAPSE: Cystocele vaginal bulging confirmed by MD   OBJECTIVE:  08/25/19: No formal pelvic floor assessment this treatment session Decreased bil hamstring/posterior hip flexibility Decreased motor  control with pelvic tilting  06/24/22: COGNITION: Overall cognitive status: Within functional limits for tasks assessed     SENSATION: Light touch: Appears intact Proprioception: Appears intact   GAIT: Comments: Forward flexion/decreased bil hip extension  POSTURE: rounded shoulders, forward head, decreased lumbar lordosis, increased thoracic kyphosis, and posterior pelvic tilt  PALPATION:   General  no abdominal tenderness, but restriction, most notably in Lt lower quadrant                External Perineal Exam anterior vaginal [place] visible; dryness vaginally; hemorrhoids externally                             Internal Pelvic Floor dryness; pt reported sharpness; stool present in rectum  Patient confirms identification and approves PT to assess internal pelvic floor and treatment Yes  PELVIC MMT:   MMT eval  Vaginal 1/5, poor coordination - consistently bearing down with squeeze  Internal Anal Sphincter 1/5  External Anal Sphincter 2/5  Puborectalis 2/5  Diastasis Recti 2 finger width separation with mild distortion with increased abdominal pressure  (Blank rows = not tested)        TONE: WNL  PROLAPSE: Grade 3 anterior vaginal [place] laxity  TODAY'S TREATMENT:                                                                                                                              DATE:   08/25/22 RE-EVAL Neuromuscular re-education: Seated pelvic tilts 2 x 10 Seated hip adduction 2 x 10 Seated hip abduction 2 x 10 Seated march with anterior hold 3lb 3 x 10 Seated chop with hip adduction  3lb 10x bil Exercises: Lower trunk rotation 3 x 10 Hamstring 2 way (straight and  lateral)   08/11/22 Neuromuscular re-education: Seated march with anterior hold 2lb 3 x 10 Standing march with anterior hold 2lb 3 x 10 Seated chop with hip adduction  2lb 10x bil Standing chop 2lb 10x bil Exercises: Seated dead lift 2 x 10 10lbs 3-way kick 10x each, bil Therapeutic activities: Urge drill/voiding window Timing of nightly fluids   08/04/22 Neuromuscular re-education: Pelvic floor contractions in inverted lying position Long holds 5x Quick flicks 10x Bridge with hip adduction 2 x 10 Transversus abdominus training with multimodal cues for improved motor control and breath coordination Supine UE ball press 2 x 10 Sidelying UE ball press 10x bil Supine march 2 x 10 Exercises: Lower trunk rotation 2 x 10 Piriformis stretch Open books 10x bil Therapeutic activities: Inverted lying for prolapse reduction Pressure management     PATIENT EDUCATION:  Education details: see above Person educated: Patient Education method: Explanation, Demonstration, Tactile cues, Verbal cues, and Handouts Education comprehension: verbalized understanding  HOME EXERCISE PROGRAM: [id number]  ASSESSMENT:  CLINICAL IMPRESSION: Pt doing extremely well in pelvic floor physical therapy. She has seen good reduction in sensation and presence of vaginal bulge. In  addition, her urinary urgency and nocturia is improving. She will still have episodes of fecal smearing after bowel movement, but it is also improving. She is very diligent about HEP and performs exercises with her niece on a regular basis. Low back pain continues to bother her, but is improving. She tolerated all previous progressions and addition of several stretches/strengthening exercises well today with no pain at end of session. She will continue to benefit from skilled PT intervention in order to strengthen pelvic floor, learn appropriate pressure management techniques, decrease fecal smearing, improve low back pain, and  begin/progress functional strengthening program.   OBJECTIVE IMPAIRMENTS: decreased activity tolerance, decreased coordination, decreased endurance, decreased strength, increased fascial restrictions, increased muscle spasms, postural dysfunction, and pain.   ACTIVITY LIMITATIONS: lifting, bending, standing, transfers, bed mobility, and continence  PARTICIPATION LIMITATIONS: cleaning and community activity  PERSONAL FACTORS: 1 comorbidity: medical history are also affecting patient's functional outcome.   REHAB POTENTIAL: Good  CLINICAL DECISION MAKING: Stable/uncomplicated  EVALUATION COMPLEXITY: Low   GOALS: Goals reviewed with patient? Yes  SHORT TERM GOALS: Target date: 07/29/22 - updated 08/04/22 - updated 08/25/22 new goal 11/03/22  Pt will be independent with HEP.   Baseline: Goal status: Met 08/04/22  2.  Pt will be independent with use of squatty potty, relaxed toileting mechanics, and improved bowel movement techniques in order to increase ease of bowel movements and complete evacuation.   Baseline:  Goal status: MET 08/25/22  3.  Pt will be independent with double voiding in order to completely empty bladder when urinating. Baseline:  Goal status: MET 08/25/22    LONG TERM GOALS: Target date: 12/09/22 - updated 08/04/22 - updated 08/25/22 new goal 11/03/22  Pt will be independent with advanced HEP.   Baseline:  Goal status: IN PROGRESS  2.  Pt will be able to correctly perform diaphragmatic breathing and appropriate pressure management in order to prevent worsening vaginal Ne[place] laxity and improve pelvic floor A/ROM.   Baseline:  Goal status: IN PROGRESS  3.  Pt will demonstrate normal pelvic floor muscle tone and A/ROM, able to achieve 3/5 strength with contractions and 10 sec endurance, in order to provide appropriate lumbopelvic support in functional activities.   Baseline:  Goal status:IN PROGRESS  4.  Pt will report no episodes of fecal incontinence or  post-void dribbling in order to improve confidence in community activities and personal hygiene.   Baseline:  Goal status: MET 08/25/22  5.  Pt will report no low back pain greater than 3/10 with prolonged standing or household chores.  Baseline:  Goal status: IN PROGRESS    PLAN:  PT FREQUENCY: 1-2x/week  PT DURATION: 10 weeks  PLANNED INTERVENTIONS: Therapeutic exercises, Therapeutic activity, Neuromuscular re-education, Balance training, Gait training, Patient/Family education, Self Care, Joint mobilization, Dry Needling, Biofeedback, and Manual therapy  PLAN FOR NEXT SESSION: Progress mobility and functional strengthening exercises (2 more appointments)  Julio Alm, PT, DPT05/21/243:17 PM

## 2022-09-03 ENCOUNTER — Ambulatory Visit: Payer: 59

## 2022-09-03 DIAGNOSIS — R2689 Other abnormalities of gait and mobility: Secondary | ICD-10-CM

## 2022-09-03 DIAGNOSIS — R279 Unspecified lack of coordination: Secondary | ICD-10-CM

## 2022-09-03 DIAGNOSIS — R293 Abnormal posture: Secondary | ICD-10-CM

## 2022-09-03 DIAGNOSIS — R3915 Urgency of urination: Secondary | ICD-10-CM

## 2022-09-03 DIAGNOSIS — M25552 Pain in left hip: Secondary | ICD-10-CM

## 2022-09-03 DIAGNOSIS — M6281 Muscle weakness (generalized): Secondary | ICD-10-CM

## 2022-09-03 NOTE — Therapy (Signed)
OUTPATIENT PHYSICAL THERAPY TREATMENT NOTE   Patient Name: [name] [name] MRN: [id number] DOB:1951-01-30, 72 y.o., female Today's Date: 09/03/2022  PCP: Harvest Forest, MD REFERRING PROVIDER: Jerene Bears, MD  END OF SESSION:   PT End of Session - 09/03/22 1531     Visit Number 6    Date for PT Re-Evaluation 11/03/22    Authorization Type [employer] Medicare    PT Start Time 1530    PT Stop Time 1610    PT Time Calculation (min) 40 min    Activity Tolerance Patient tolerated treatment well    Behavior During Therapy WFL for tasks assessed/performed              Past Medical History:  Diagnosis Date   Allergy    Anemia    Arthritis    Asthma    as child only   Back pain    Diabetes mellitus without complication (HCC)    Hypertension    Hypothyroidism    Past Surgical History:  Procedure Laterality Date   ABDOMINAL HYSTERECTOMY  1989   fibroid-no longer needs pap   BACK SURGERY  1987, 1985   DDD   CARPAL TUNNEL RELEASE Left 05/28/2016   Procedure: CARPAL TUNNEL RELEASE;  Surgeon: Cindee Salt, MD;  Location: [facility];  Service: Orthopedics;  Laterality: Left;   CARPAL TUNNEL RELEASE Right 03/17/2018   Procedure: CARPAL TUNNEL RELEASE;  Surgeon: Cindee Salt, MD;  Location: [facility];  Service: Orthopedics;  Laterality: Right;   TOTAL HIP ARTHROPLASTY Right 11/18/2018   Procedure: RIGHT TOTAL HIP ARTHROPLASTY ANTERIOR APPROACH;  Surgeon: Kathryne Hitch, MD;  Location: [facility];  Service: Orthopedics;  Laterality: Right;   TOTAL HIP ARTHROPLASTY Left 01/10/2021   Procedure: LEFT TOTAL HIP ARTHROPLASTY ANTERIOR APPROACH;  Surgeon: Kathryne Hitch, MD;  Location: [facility];  Service: Orthopedics;  Laterality: Left;   TUBAL LIGATION     Patient Active Problem List   Diagnosis Date Noted   Facet arthropathy, lumbosacral 06/11/2022   Primary osteoarthritis of left shoulder 01/14/2022   Status post left hip replacement  01/10/2021   Vitamin D deficiency 06/27/2020   Peripheral sensory neuropathy due to type 2 diabetes mellitus (HCC) 12/01/2019   Mild intermittent asthma 12/01/2019   Myositis 12/01/2019   Status post total replacement of right hip 11/18/2018   Spinal stenosis of lumbar region with neurogenic claudication 04/20/2018   Impingement syndrome of right shoulder 04/20/2018   Type II diabetes mellitus, uncontrolled 11/05/2017   Mixed hyperlipidemia 06/04/2017   Acquired hypothyroidism 03/05/2017   Normocytic anemia 01/24/2017   Bilateral carpal tunnel syndrome 05/06/2016   Essential hypertension, benign 02/20/2016   Primary osteoarthritis of left knee 05/24/2014   Elevated blood pressure reading 09/29/2011   Chronic back pain 09/29/2011   Leg edema 09/29/2011   Obesity with body mass index 30 or greater 09/29/2011    REFERRING DIAG: N81.10 (ICD-10-CM) - Female cystocele R15.1 (ICD-10-CM) - Fecal smearing  THERAPY DIAG:  Abnormal posture  Muscle weakness (generalized)  Unspecified lack of coordination  Pain in left hip  Urinary urgency  Other abnormalities of gait and mobility  Rationale for Evaluation and Treatment Rehabilitation  PERTINENT HISTORY: Hysterectomy, bil THA, tubal ligation  PRECAUTIONS: NA  SUBJECTIVE:  SUBJECTIVE STATEMENT: Pt has been drinking    PAIN:  Are you having pain? Yes NPRS scale: 6/10 Pain location: low back pain  Pain type: aching and tight Pain description: constant   Aggravating factors: standing, washing dishes Relieving factors: epidural injections in low back 3 weeks ago, sitting  SUBJECTIVE:                                                                                                                                                                                            06/24/22 SUBJECTIVE STATEMENT: Pt states that about 4 months ago she started having sensation of bulging in vagina. She notices that things are different when she is urinating - the sound is different. She is unsure when she is finished.  Fluid intake: Yes: patient states that she does not drink much water, but is working on it   PAIN:  Are you having pain? Yes NPRS scale: 8/10 Pain location: low back pain  Pain type: aching and tight Pain description: constant   Aggravating factors: standing, washing dishes Relieving factors: epidural injections in low back 3 weeks ago, sitting  PRECAUTIONS: None  WEIGHT BEARING RESTRICTIONS: No  FALLS:  Has patient fallen in last 6 months? No  LIVING ENVIRONMENT: Lives with: lives alone Lives in: House/apartment  OCCUPATION: retired  PLOF: Independent  PATIENT GOALS: to fix prolapse and decrease low back pain  PERTINENT HISTORY:  Hysterectomy, bil THA, tubal ligation  Sexual abuse: No  BOWEL MOVEMENT: Pain with bowel movement: No Type of bowel movement:Frequency lately 1x/day and Strain Yes Fully empty rectum: Yes: - Leakage: Yes: fecal smearing; she'll wipe clean after bowel movement and then have smearing when she goes to the bathroom next Pads: No Fiber supplement: No  URINATION: Pain with urination: No Fully empty bladder: Yes: but she does have post-void dribbling; she can also sit there and more will come Stream: inconsistent Urgency: Yes: sometimes it hits her and she has to go immediately Frequency: every hour during the day; 2-3x/night Leakage: none Pads: No  INTERCOURSE: Pain with intercourse: not sexually active, no history of pain   PREGNANCY: Vaginal deliveries 1 Tearing No   PROLAPSE: Cystocele vaginal bulging confirmed by MD   OBJECTIVE:  08/25/19: No formal pelvic floor assessment this treatment session Decreased bil hamstring/posterior hip flexibility Decreased motor control  with pelvic tilting  06/24/22: COGNITION: Overall cognitive status: Within functional limits for tasks assessed     SENSATION: Light touch: Appears intact Proprioception: Appears intact   GAIT: Comments: Forward flexion/decreased bil hip extension  POSTURE: rounded shoulders, forward head, decreased lumbar lordosis, increased thoracic kyphosis, and posterior  pelvic tilt  PALPATION:   General  no abdominal tenderness, but restriction, most notably in Lt lower quadrant                External Perineal Exam anterior vaginal [place] visible; dryness vaginally; hemorrhoids externally                             Internal Pelvic Floor dryness; pt reported sharpness; stool present in rectum  Patient confirms identification and approves PT to assess internal pelvic floor and treatment Yes  PELVIC MMT:   MMT eval  Vaginal 1/5, poor coordination - consistently bearing down with squeeze  Internal Anal Sphincter 1/5  External Anal Sphincter 2/5  Puborectalis 2/5  Diastasis Recti 2 finger width separation with mild distortion with increased abdominal pressure  (Blank rows = not tested)        TONE: WNL  PROLAPSE: Grade 3 anterior vaginal [place] laxity  TODAY'S TREATMENT:                                                                                                                              DATE:   09/03/22 Neuromuscular re-education: Standing pelvic tilts 12x Standing march with anterior weight hold 2 x 10 2lbs Standing march with unilateral above head weight hold 2 x 10 bil 2lbs Seated chop 2 x 10 bil 3lbs Exercises: Seated hamstring stretch 2 x 60 seconds bil Seated forward fold 2 x 60 seconds Therapeutic activities: Seated dead lift 5lbs 2 x 10 Standing hip abduction 2 x10 bil Standing heel raises 3 x 10 Squats 12x   08/25/22 RE-EVAL Neuromuscular re-education: Seated pelvic tilts 2 x 10 Seated hip adduction 2 x 10 Seated hip abduction 2 x 10 Seated march with  anterior hold 3lb 3 x 10 Seated chop with hip adduction  3lb 10x bil Exercises: Lower trunk rotation 3 x 10 Hamstring 2 way (straight and lateral)   08/11/22 Neuromuscular re-education: Seated march with anterior hold 2lb 3 x 10 Standing march with anterior hold 2lb 3 x 10 Seated chop with hip adduction  2lb 10x bil Standing chop 2lb 10x bil Exercises: Seated dead lift 2 x 10 10lbs 3-way kick 10x each, bil Therapeutic activities: Urge drill/voiding window Timing of nightly fluids   PATIENT EDUCATION:  Education details: see above Person educated: Patient Education method: Explanation, Demonstration, Tactile cues, Verbal cues, and Handouts Education comprehension: verbalized understanding  HOME EXERCISE PROGRAM: [id number]  ASSESSMENT:  CLINICAL IMPRESSION: Pt continues to report improvement in symptoms. She rarely has episodes of fecal incontinence/smearing and states that urinary urgency is not really bothering her. Due to progress, she is on track to D/C next session. She was able to progress standing and seated exercises today with excellent tolerance. She is able to progress through exercises with fewer rest breaks. She will continue to benefit from skilled PT intervention in order to strengthen pelvic floor, learn  appropriate pressure management techniques, decrease fecal smearing, improve low back pain, and begin/progress functional strengthening program.   OBJECTIVE IMPAIRMENTS: decreased activity tolerance, decreased coordination, decreased endurance, decreased strength, increased fascial restrictions, increased muscle spasms, postural dysfunction, and pain.   ACTIVITY LIMITATIONS: lifting, bending, standing, transfers, bed mobility, and continence  PARTICIPATION LIMITATIONS: cleaning and community activity  PERSONAL FACTORS: 1 comorbidity: medical history are also affecting patient's functional outcome.   REHAB POTENTIAL: Good  CLINICAL DECISION MAKING:  Stable/uncomplicated  EVALUATION COMPLEXITY: Low   GOALS: Goals reviewed with patient? Yes  SHORT TERM GOALS: Target date: 07/29/22 - updated 08/04/22 - updated 08/25/22 new goal 11/03/22  Pt will be independent with HEP.   Baseline: Goal status: Met 08/04/22  2.  Pt will be independent with use of squatty potty, relaxed toileting mechanics, and improved bowel movement techniques in order to increase ease of bowel movements and complete evacuation.   Baseline:  Goal status: MET 08/25/22  3.  Pt will be independent with double voiding in order to completely empty bladder when urinating. Baseline:  Goal status: MET 08/25/22    LONG TERM GOALS: Target date: 12/09/22 - updated 08/04/22 - updated 08/25/22 new goal 11/03/22  Pt will be independent with advanced HEP.   Baseline:  Goal status: IN PROGRESS  2.  Pt will be able to correctly perform diaphragmatic breathing and appropriate pressure management in order to prevent worsening vaginal [place] laxity and improve pelvic floor A/ROM.   Baseline:  Goal status: IN PROGRESS  3.  Pt will demonstrate normal pelvic floor muscle tone and A/ROM, able to achieve 3/5 strength with contractions and 10 sec endurance, in order to provide appropriate lumbopelvic support in functional activities.   Baseline:  Goal status:IN PROGRESS  4.  Pt will report no episodes of fecal incontinence or post-void dribbling in order to improve confidence in community activities and personal hygiene.   Baseline:  Goal status: MET 08/25/22  5.  Pt will report no low back pain greater than 3/10 with prolonged standing or household chores.  Baseline:  Goal status: IN PROGRESS    PLAN:  PT FREQUENCY: 1-2x/week  PT DURATION: 10 weeks  PLANNED INTERVENTIONS: Therapeutic exercises, Therapeutic activity, Neuromuscular re-education, Balance training, Gait training, Patient/Family education, Self Care, Joint mobilization, Dry Needling, Biofeedback, and Manual  therapy  PLAN FOR NEXT SESSION: Plan to D/C (1 more appointment)  Julio Alm, PT, DPT05/30/243:50 PM

## 2022-09-05 ENCOUNTER — Ambulatory Visit
Admission: RE | Admit: 2022-09-05 | Discharge: 2022-09-05 | Disposition: A | Discharge status: Home / Self Care | Payer: 59 | Source: Ambulatory Visit | Attending: Orthopaedic Surgery | Admitting: Orthopaedic Surgery

## 2022-09-05 DIAGNOSIS — M5416 Radiculopathy, lumbar region: Secondary | ICD-10-CM

## 2022-09-08 ENCOUNTER — Ambulatory Visit: Payer: 59 | Attending: Obstetrics & Gynecology

## 2022-09-08 DIAGNOSIS — R279 Unspecified lack of coordination: Secondary | ICD-10-CM | POA: Diagnosis present

## 2022-09-08 DIAGNOSIS — R151 Fecal smearing: Secondary | ICD-10-CM | POA: Insufficient documentation

## 2022-09-08 DIAGNOSIS — M25552 Pain in left hip: Secondary | ICD-10-CM | POA: Diagnosis present

## 2022-09-08 DIAGNOSIS — R293 Abnormal posture: Secondary | ICD-10-CM | POA: Diagnosis present

## 2022-09-08 DIAGNOSIS — R3915 Urgency of urination: Secondary | ICD-10-CM | POA: Diagnosis present

## 2022-09-08 DIAGNOSIS — M6281 Muscle weakness (generalized): Secondary | ICD-10-CM | POA: Diagnosis present

## 2022-09-08 NOTE — Therapy (Signed)
OUTPATIENT PHYSICAL THERAPY TREATMENT NOTE   Patient Name: [name] [name] MRN: [id number] DOB:1950-04-17, 72 y.o., female Today's Date: 09/08/2022  PCP: Harvest Forest, MD REFERRING PROVIDER: Jerene Bears, MD  END OF SESSION:   PT End of Session - 09/08/22 1534     Visit Number 7    Authorization Type [employer] Medicare    PT Start Time 1530    PT Stop Time 1610    PT Time Calculation (min) 40 min    Activity Tolerance Patient tolerated treatment well    Behavior During Therapy WFL for tasks assessed/performed               Past Medical History:  Diagnosis Date   Allergy    Anemia    Arthritis    Asthma    as child only   Back pain    Diabetes mellitus without complication (HCC)    Hypertension    Hypothyroidism    Past Surgical History:  Procedure Laterality Date   ABDOMINAL HYSTERECTOMY  1989   fibroid-no longer needs pap   BACK SURGERY  1987, 1985   DDD   CARPAL TUNNEL RELEASE Left 05/28/2016   Procedure: CARPAL TUNNEL RELEASE;  Surgeon: Cindee Salt, MD;  Location: [facility];  Service: Orthopedics;  Laterality: Left;   CARPAL TUNNEL RELEASE Right 03/17/2018   Procedure: CARPAL TUNNEL RELEASE;  Surgeon: Cindee Salt, MD;  Location:  SURGERY CENTER;  Service: Orthopedics;  Laterality: Right;   TOTAL HIP ARTHROPLASTY Right 11/18/2018   Procedure: RIGHT TOTAL HIP ARTHROPLASTY ANTERIOR APPROACH;  Surgeon: Kathryne Hitch, MD;  Location: [facility];  Service: Orthopedics;  Laterality: Right;   TOTAL HIP ARTHROPLASTY Left 01/10/2021   Procedure: LEFT TOTAL HIP ARTHROPLASTY ANTERIOR APPROACH;  Surgeon: Kathryne Hitch, MD;  Location: [facility];  Service: Orthopedics;  Laterality: Left;   TUBAL LIGATION     Patient Active Problem List   Diagnosis Date Noted   Facet arthropathy, lumbosacral 06/11/2022   Primary osteoarthritis of left shoulder 01/14/2022   Status post left hip replacement 01/10/2021   Vitamin D deficiency  06/27/2020   Peripheral sensory neuropathy due to type 2 diabetes mellitus (HCC) 12/01/2019   Mild intermittent asthma 12/01/2019   Myositis 12/01/2019   Status post total replacement of right hip 11/18/2018   Spinal stenosis of lumbar region with neurogenic claudication 04/20/2018   Impingement syndrome of right shoulder 04/20/2018   Type II diabetes mellitus, uncontrolled 11/05/2017   Mixed hyperlipidemia 06/04/2017   Acquired hypothyroidism 03/05/2017   Normocytic anemia 01/24/2017   Bilateral carpal tunnel syndrome 05/06/2016   Essential hypertension, benign 02/20/2016   Primary osteoarthritis of left knee 05/24/2014   Elevated blood pressure reading 09/29/2011   Chronic back pain 09/29/2011   Leg edema 09/29/2011   Obesity with body mass index 30 or greater 09/29/2011    REFERRING DIAG: N81.10 (ICD-10-CM) - Female cystocele R15.1 (ICD-10-CM) - Fecal smearing  THERAPY DIAG:  Abnormal posture  Muscle weakness (generalized)  Unspecified lack of coordination  Pain in left hip  Urinary urgency  Fecal smearing  Rationale for Evaluation and Treatment Rehabilitation  PERTINENT HISTORY: Hysterectomy, bil THA, tubal ligation  PRECAUTIONS: NA  SUBJECTIVE:  SUBJECTIVE STATEMENT: Pt states that she has met her goals for being in physical therapy. She has not had any episodes of fecal incontinence or urinary urgency.    PAIN:  Are you having pain? Yes NPRS scale: 6/10 Pain location: low back pain  Pain type: aching and tight Pain description: constant   Aggravating factors: standing, washing dishes Relieving factors: epidural injections in low back 3 weeks ago, sitting  SUBJECTIVE:                                                                                                                                                                                            06/24/22 SUBJECTIVE STATEMENT: Pt states that about 4 months ago she started having sensation of bulging in vagina. She notices that things are different when she is urinating - the sound is different. She is unsure when she is finished.  Fluid intake: Yes: patient states that she does not drink much water, but is working on it   PAIN:  Are you having pain? Yes NPRS scale: 8/10 Pain location: low back pain  Pain type: aching and tight Pain description: constant   Aggravating factors: standing, washing dishes Relieving factors: epidural injections in low back 3 weeks ago, sitting  PRECAUTIONS: None  WEIGHT BEARING RESTRICTIONS: No  FALLS:  Has patient fallen in last 6 months? No  LIVING ENVIRONMENT: Lives with: lives alone Lives in: House/apartment  OCCUPATION: retired  PLOF: Independent  PATIENT GOALS: to fix prolapse and decrease low back pain  PERTINENT HISTORY:  Hysterectomy, bil THA, tubal ligation  Sexual abuse: No  BOWEL MOVEMENT: Pain with bowel movement: No Type of bowel movement:Frequency lately 1x/day and Strain Yes Fully empty rectum: Yes: - Leakage: Yes: fecal smearing; she'll wipe clean after bowel movement and then have smearing when she goes to the bathroom next Pads: No Fiber supplement: No  URINATION: Pain with urination: No Fully empty bladder: Yes: but she does have post-void dribbling; she can also sit there and more will come Stream: inconsistent Urgency: Yes: sometimes it hits her and she has to go immediately Frequency: every hour during the day; 2-3x/night Leakage: none Pads: No  INTERCOURSE: Pain with intercourse: not sexually active, no history of pain   PREGNANCY: Vaginal deliveries 1 Tearing No   PROLAPSE: Cystocele vaginal bulging confirmed by MD   OBJECTIVE:  08/25/19: No formal pelvic floor assessment this treatment session Decreased bil  hamstring/posterior hip flexibility Decreased motor control with pelvic tilting  06/24/22: COGNITION: Overall cognitive status: Within functional limits for tasks assessed     SENSATION: Light touch: Appears intact Proprioception: Appears intact  GAIT: Comments: Forward flexion/decreased bil hip extension  POSTURE: rounded shoulders, forward head, decreased lumbar lordosis, increased thoracic kyphosis, and posterior pelvic tilt  PALPATION:   General  no abdominal tenderness, but restriction, most notably in Lt lower quadrant                External Perineal Exam anterior vaginal Torosian visible; dryness vaginally; hemorrhoids externally                             Internal Pelvic Floor dryness; pt reported sharpness; stool present in rectum  Patient confirms identification and approves PT to assess internal pelvic floor and treatment Yes  PELVIC MMT:   MMT eval  Vaginal 1/5, poor coordination - consistently bearing down with squeeze  Internal Anal Sphincter 1/5  External Anal Sphincter 2/5  Puborectalis 2/5  Diastasis Recti 2 finger width separation with mild distortion with increased abdominal pressure  (Blank rows = not tested)        TONE: WNL  PROLAPSE: Grade 3 anterior vaginal Fell laxity  TODAY'S TREATMENT:                                                                                                                              DATE:   09/08/22 Therapeutic activities: Standing rows red band 3 x 10 Standing bil UE extension red band 3 x 10 Pallof press red band 2 x 10 bil Pressure management   09/03/22 Neuromuscular re-education: Standing pelvic tilts 12x Standing march with anterior weight hold 2 x 10 2lbs Standing march with unilateral above head weight hold 2 x 10 bil 2lbs Seated chop 2 x 10 bil 3lbs Exercises: Seated hamstring stretch 2 x 60 seconds bil Seated forward fold 2 x 60 seconds Therapeutic activities: Seated dead lift 5lbs 2 x 10 Standing  hip abduction 2 x10 bil Standing heel raises 3 x 10 Squats 12x   08/25/22 RE-EVAL Neuromuscular re-education: Seated pelvic tilts 2 x 10 Seated hip adduction 2 x 10 Seated hip abduction 2 x 10 Seated march with anterior hold 3lb 3 x 10 Seated chop with hip adduction  3lb 10x bil Exercises: Lower trunk rotation 3 x 10 Hamstring 2 way (straight and lateral)  PATIENT EDUCATION:  Education details: see above Person educated: Patient Education method: Explanation, Demonstration, Tactile cues, Verbal cues, and Handouts Education comprehension: verbalized understanding  HOME EXERCISE PROGRAM: [id number]  ASSESSMENT:  CLINICAL IMPRESSION: Pt has done very well in pelvic floor PT demonstrated by no episodes of fecal incontinence and significant reduction in urinary urgency. Believe that she is managing vaginal Common laxity well with education on pressure management and functional strengthening program. She was encouraged to continue exercising on a regular basis. Due to having met goals for being here, she is prepared to D/C skilled PT intervention at this time; she was encouraged to call with any questions/concerns.   OBJECTIVE IMPAIRMENTS: decreased activity  tolerance, decreased coordination, decreased endurance, decreased strength, increased fascial restrictions, increased muscle spasms, postural dysfunction, and pain.   ACTIVITY LIMITATIONS: lifting, bending, standing, transfers, bed mobility, and continence  PARTICIPATION LIMITATIONS: cleaning and community activity  PERSONAL FACTORS: 1 comorbidity: medical history are also affecting patient's functional outcome.   REHAB POTENTIAL: Good  CLINICAL DECISION MAKING: Stable/uncomplicated  EVALUATION COMPLEXITY: Low   GOALS: Goals reviewed with patient? Yes  SHORT TERM GOALS: Target date: 07/29/22 - updated 08/04/22 - updated 08/25/22 new goal 11/03/22 - updated 09/08/22  Pt will be independent with HEP.   Baseline: Goal status:  Met 08/04/22  2.  Pt will be independent with use of squatty potty, relaxed toileting mechanics, and improved bowel movement techniques in order to increase ease of bowel movements and complete evacuation.   Baseline:  Goal status: MET 08/25/22  3.  Pt will be independent with double voiding in order to completely empty bladder when urinating. Baseline:  Goal status: MET 08/25/22    LONG TERM GOALS: Target date: 12/09/22 - updated 08/04/22 - updated 08/25/22 new goal 11/03/22 - updated 09/08/22  Pt will be independent with advanced HEP.   Baseline:  Goal status: MET 09/08/22  2.  Pt will be able to correctly perform diaphragmatic breathing and appropriate pressure management in order to prevent worsening vaginal Shone laxity and improve pelvic floor A/ROM.   Baseline:  Goal status: MET 09/08/22  3.  Pt will demonstrate normal pelvic floor muscle tone and A/ROM, able to achieve 3/5 strength with contractions and 10 sec endurance, in order to provide appropriate lumbopelvic support in functional activities.   Baseline: Not formally assessed today Goal status: DISCHARGED  4.  Pt will report no episodes of fecal incontinence or post-void dribbling in order to improve confidence in community activities and personal hygiene.   Baseline:  Goal status: MET 08/25/22  5.  Pt will report no low back pain greater than 3/10 with prolonged standing or household chores.  Baseline:  Pt seeking further care for low back/knee pain Goal status: DISCHARGED    PLAN:  PT FREQUENCY: -  PT DURATION: -  PLANNED INTERVENTIONS: -  PLAN FOR NEXT SESSION: DC  PHYSICAL THERAPY DISCHARGE SUMMARY  Visits from Start of Care: 7  Current functional level related to goals / functional outcomes: Independent   Remaining deficits: See above   Education / Equipment: HEP   Patient agrees to discharge. Patient goals were met. Patient is being discharged due to meeting the stated rehab goals.   Julio Alm, PT, DPT06/04/244:00 PM

## 2022-09-15 ENCOUNTER — Ambulatory Visit (HOSPITAL_BASED_OUTPATIENT_CLINIC_OR_DEPARTMENT_OTHER): Payer: 59 | Admitting: Obstetrics & Gynecology

## 2022-09-19 ENCOUNTER — Other Ambulatory Visit: Payer: Self-pay | Admitting: Orthopaedic Surgery

## 2022-09-21 ENCOUNTER — Ambulatory Visit: Payer: 59 | Admitting: Physician Assistant

## 2022-09-21 ENCOUNTER — Other Ambulatory Visit: Payer: Self-pay | Admitting: Radiology

## 2022-09-21 DIAGNOSIS — M5416 Radiculopathy, lumbar region: Secondary | ICD-10-CM

## 2022-09-22 ENCOUNTER — Ambulatory Visit: Payer: 59 | Admitting: Family Medicine

## 2022-09-23 ENCOUNTER — Telehealth: Payer: Self-pay | Admitting: Orthopaedic Surgery

## 2022-09-23 ENCOUNTER — Other Ambulatory Visit: Payer: Self-pay | Admitting: Orthopaedic Surgery

## 2022-09-23 NOTE — Telephone Encounter (Signed)
Pt called requesting pin medication and muscle relaxer until her appt with Dr Christell Constant 7/10. Please send to pharmacy [facility] . Pt phone number is [phone].

## 2022-09-23 NOTE — Telephone Encounter (Signed)
noted 

## 2022-09-23 NOTE — Telephone Encounter (Signed)
Please advise 

## 2022-10-05 ENCOUNTER — Ambulatory Visit: Payer: 59

## 2022-10-05 IMAGING — MG MM DIGITAL SCREENING BILAT W/ TOMO AND CAD
8 series · 8 of 24 positions shown · non-contrast
Comparison: Previous exam(s).

CLINICAL DATA: Screening.

EXAM:
DIGITAL SCREENING BILATERAL MAMMOGRAM WITH TOMOSYNTHESIS AND CAD
TECHNIQUE: Bilateral screening digital craniocaudal and mediolateral oblique
mammograms were obtained. Bilateral screening digital breast
tomosynthesis was performed. The images were evaluated with
computer-aided detection.

[L CC synth-2D]
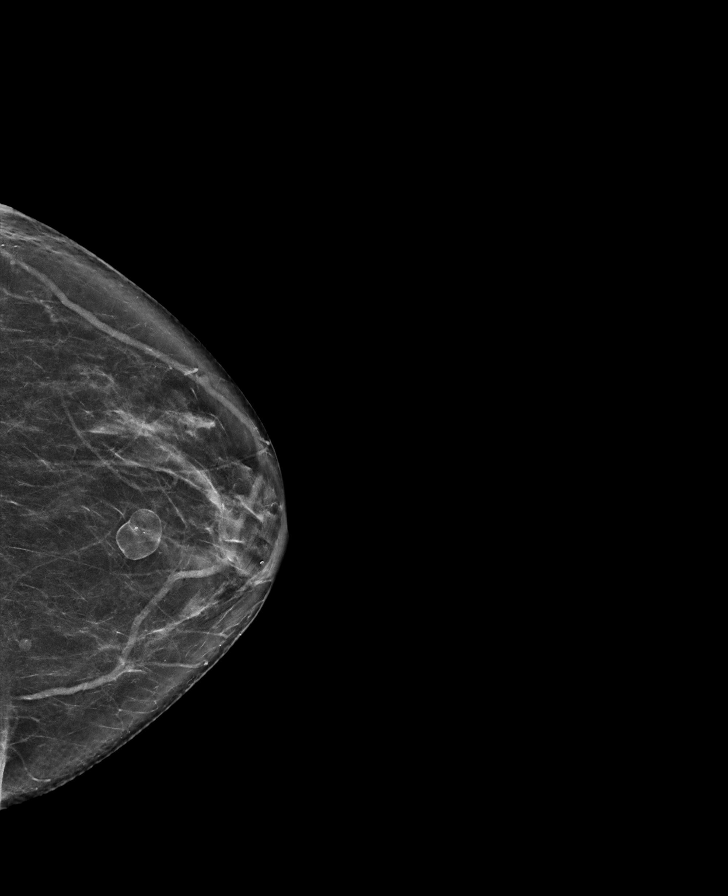

[L MLO synth-2D]
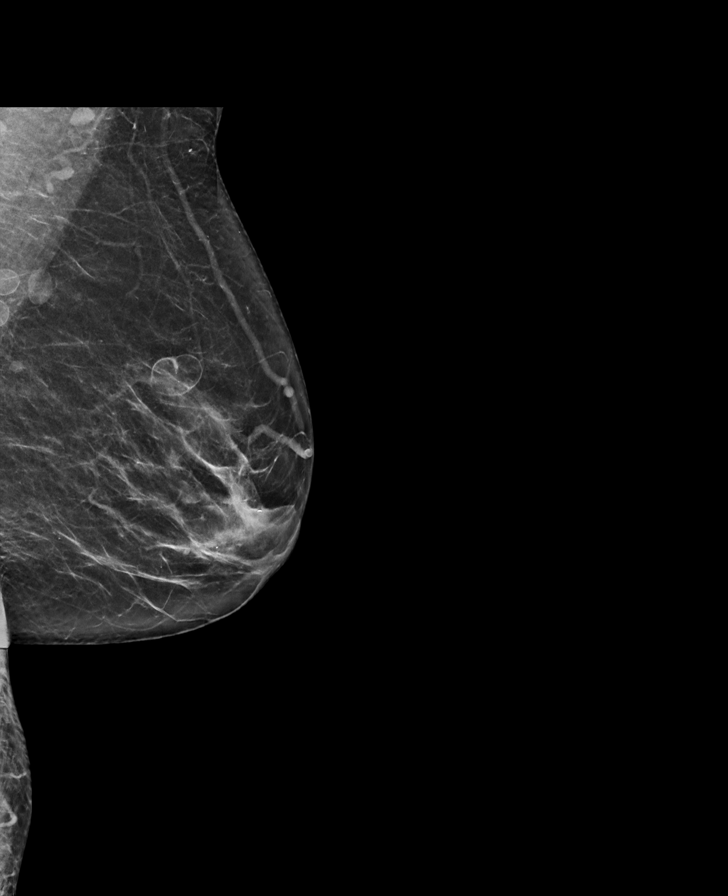

[R CC synth-2D]
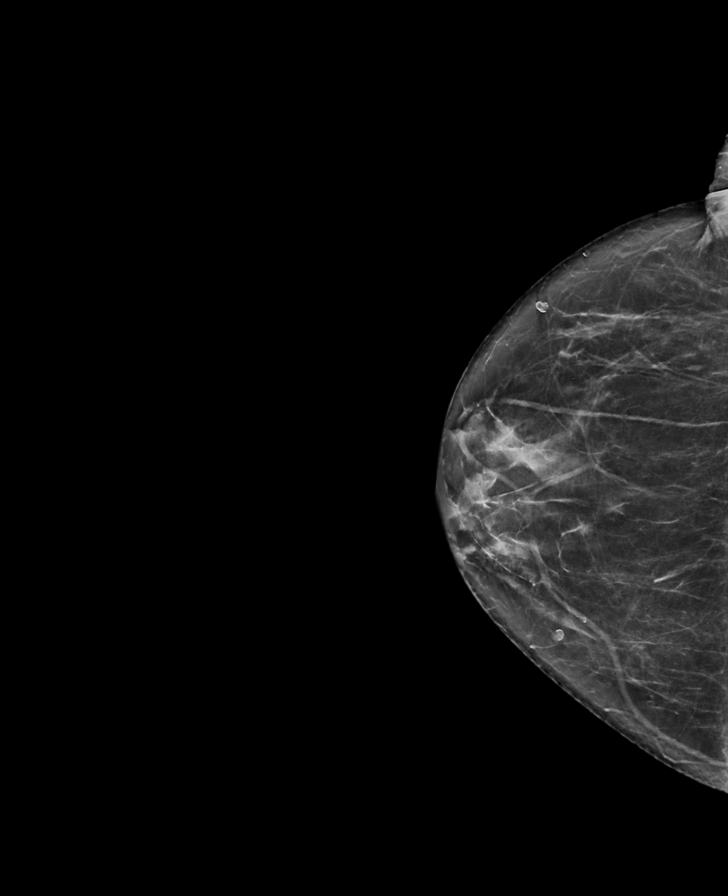

[R MLO synth-2D]
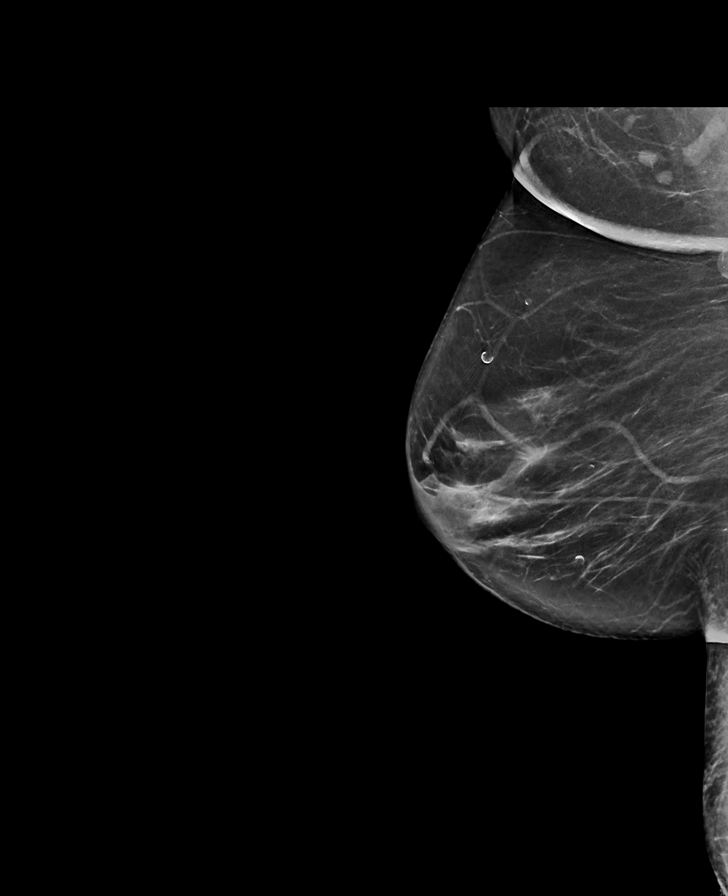

[R CC tomo · tomo slice 35/68.0]
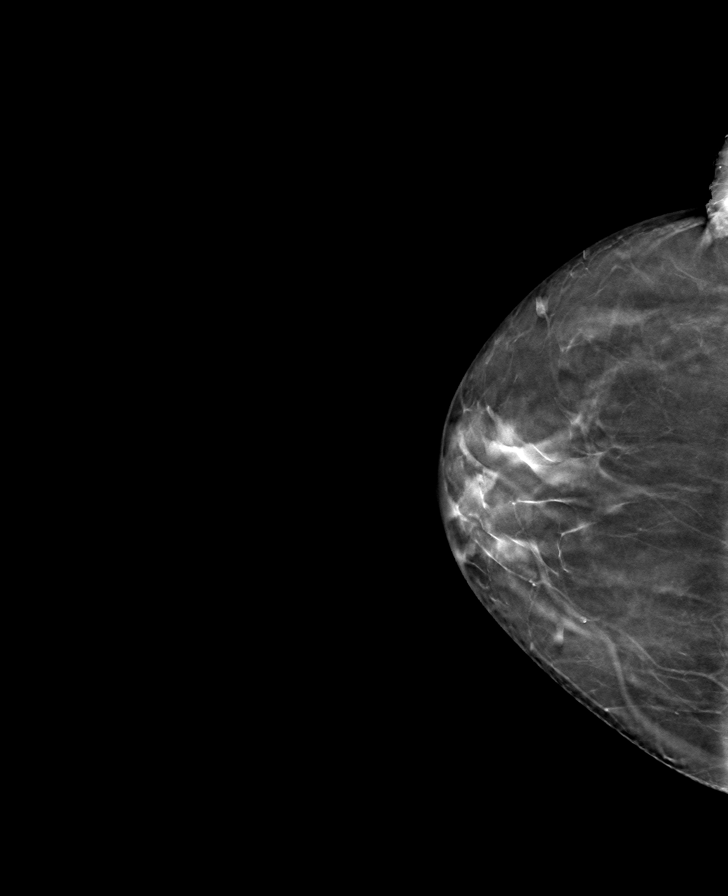

[R MLO tomo · tomo slice 39/76.0]
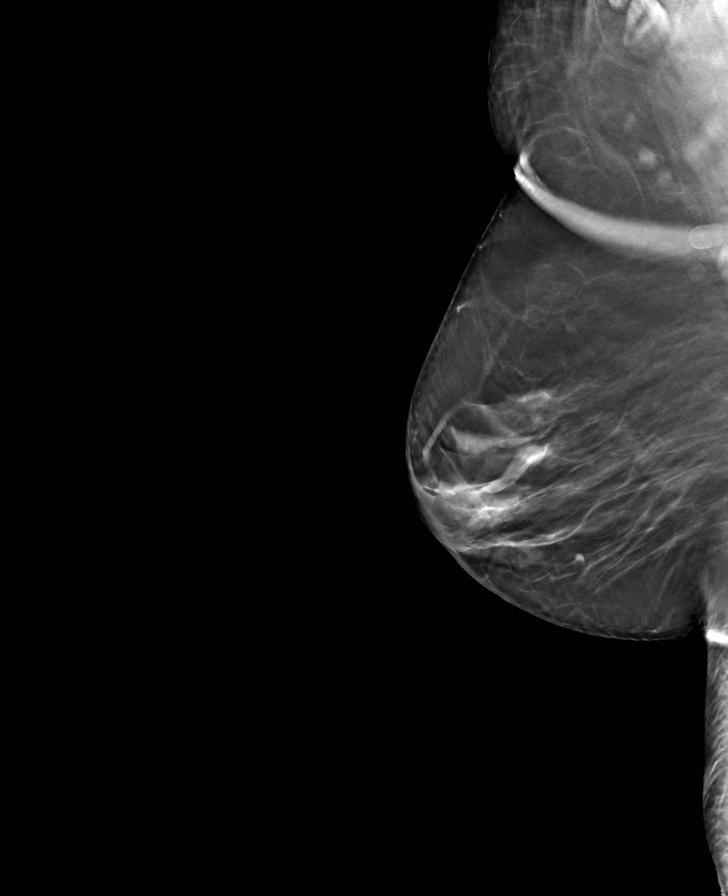

[L CC tomo · tomo slice 37/72.0]
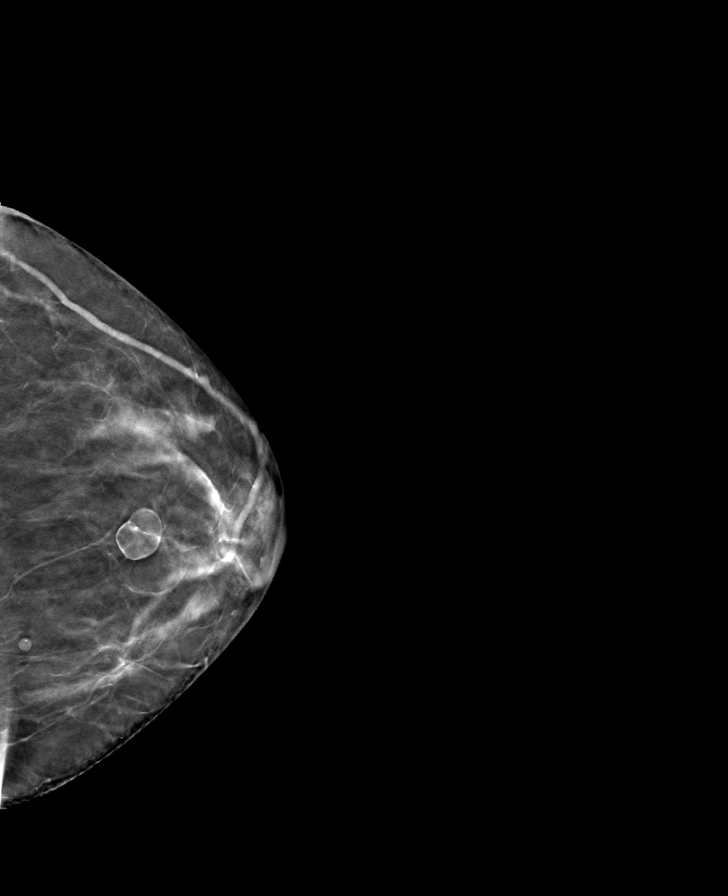

[L MLO tomo · tomo slice 35/70.0]
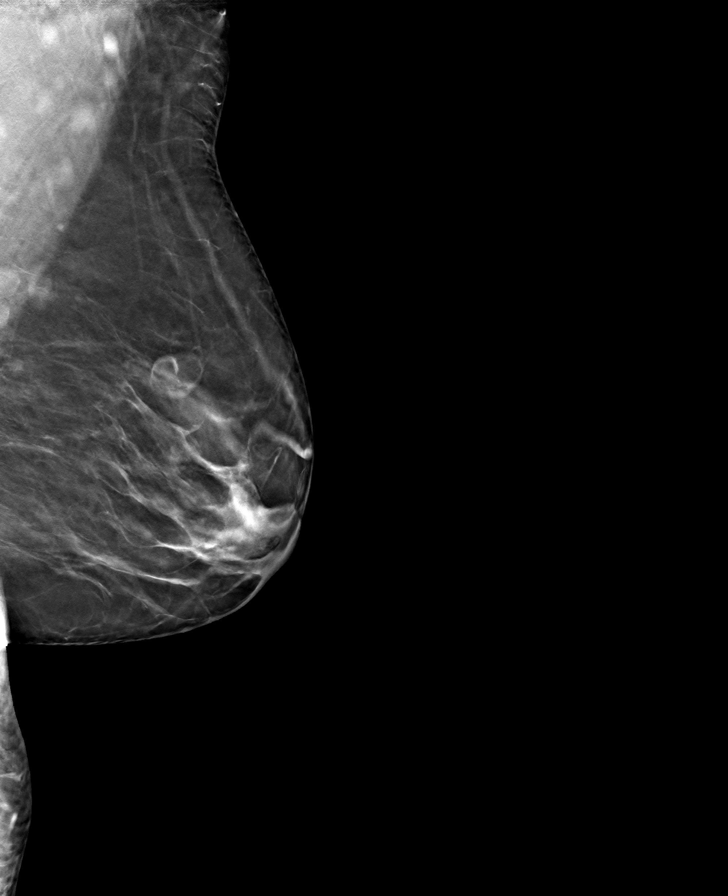

[8 of 24 positions shown; findings below may reference images not displayed]

ACR Breast Density Category c: The breast tissue is heterogeneously
dense, which may obscure small masses.
FINDINGS: There are no findings suspicious for malignancy.
IMPRESSION: No mammographic evidence of malignancy. A result letter of this
screening mammogram will be mailed directly to the patient.

RECOMMENDATION:
Screening mammogram in one year. (Code:[id number])

BI-RADS CATEGORY  1: Negative.

## 2022-10-14 ENCOUNTER — Other Ambulatory Visit (INDEPENDENT_AMBULATORY_CARE_PROVIDER_SITE_OTHER): Payer: 59

## 2022-10-14 ENCOUNTER — Encounter: Payer: Self-pay | Admitting: Orthopedic Surgery

## 2022-10-14 ENCOUNTER — Ambulatory Visit (INDEPENDENT_AMBULATORY_CARE_PROVIDER_SITE_OTHER): Payer: 59 | Admitting: Orthopedic Surgery

## 2022-10-14 DIAGNOSIS — M545 Low back pain, unspecified: Secondary | ICD-10-CM

## 2022-10-14 DIAGNOSIS — M5416 Radiculopathy, lumbar region: Secondary | ICD-10-CM

## 2022-10-14 DIAGNOSIS — G8929 Other chronic pain: Secondary | ICD-10-CM | POA: Diagnosis not present

## 2022-10-14 NOTE — Progress Notes (Signed)
Orthopedic Spine Surgery Office Note  Assessment: Patient is a 72 y.o. female with chronic, progressive low back pain.  Has central stenosis from L1-L5 and PI-LL mismatch   Plan: -Explained that initially conservative treatment is tried as a significant number of patients may experience relief with these treatment modalities. Discussed that the conservative treatments include:  -activity modification  -physical therapy  -over the counter pain medications  -medrol dosepak  -lumbar steroid injections -Patient has tried PT, ibuprofen, lumbar steroid injection -Discussed surgery to correct her PI-LL mismatch and decompress her central stenosis.  Patient was not interested in surgery.  I explained that she had tried several conservative treatments, so her alternative would be pain management.  She wanted to pursue that option so referral was provided to her today -Patient should return to office in 8 weeks, x-rays at next visit: None   Patient expressed understanding of the plan and all questions were answered to the patient's satisfaction.   ___________________________________________________________________________   History:  Patient is a 72 y.o. female who presents today for lumbar spine.  Patient has had about 1 year of low back pain.  Pain has been getting progressively worse with time.  There is no trauma or injury that preceded the onset of pain.  Pain does not radiate into either lower extremity.  She notices the pain is worse if she walks more than 2 to 3 minutes.  She also notices it when she is at the kitchen sink doing dishes or any other activities that require standing for more than 5 minutes.  She notes the pain goes away if she sits down.   Weakness: Denies Symptoms of imbalance: Denies Paresthesias and numbness: Denies Bowel or bladder incontinence: Denies Saddle anesthesia: Denies  Treatments tried: Physical therapy, ibuprofen, lumbar steroid injection  Review of  systems: Denies fevers and chills, night sweats, unexplained weight loss, history of cancer, pain that wakes them at night  Past medical history: HLD HTN Chronic pain DM (A1c has been under 7)  Allergies: Gabapentin, Celebrex, metformin, Flagyl  Past surgical history:  L5/S1 discectomy and subsequent revision Bilateral THA  Social history: Denies use of nicotine product (smoking, vaping, patches, smokeless) Alcohol use: Denies Denies recreational drug use   Physical Exam:  General: no acute distress, appears stated age Neurologic: alert, answering questions appropriately, following commands Respiratory: unlabored breathing on room air, symmetric chest rise Psychiatric: appropriate affect, normal cadence to speech   MSK (spine):  -Strength exam      Left  Right EHL    5/5  5/5 TA    5/5  5/5 GSC    5/5  5/5 Knee extension  5/5  5/5 Hip flexion   5/5  5/5  -Sensory exam    Sensation intact to light touch in L3-S1 nerve distributions of bilateral lower extremities  -Achilles DTR: 1/4 on the left, 1/4 on the right -Patellar tendon DTR: 1/4 on the left, 1/4 on the right  -Straight leg raise: Negative bilaterally -Femoral nerve stretch test: Negative bilaterally -Clonus: no beats bilaterally  -Left hip exam: No pain through range of motion, negative Stinchfield, negative FABER -Right hip exam: No pain through range of motion, negative Stinchfield, negative FABER  Imaging: XR of the lumbar spine from 10/14/2022 was independently reviewed and interpreted, showing bilateral hip replacements.  No fracture or dislocation seen.  Disc height loss throughout the lumbar spine.  No evidence of instability on flexion/extension views.  Asymmetric disc height loss at L4-5 causing coronal curve with  apex to the right.  PI of 67, LL of 39.  MRI of the lumbar spine from 09/05/2022 was independently reviewed and interpreted, showing central stenosis at L1/2.  Central and lateral  recess stenosis on the left at L2/3.  Bilateral foraminal stenosis at L2/3.  Central and lateral recess stenosis at L3/4.  Bilateral foraminal stenosis at L3/4.  Central stenosis at L4/5 with anterior listhesis noted.  Right hemilaminotomy at L5/S1.   Patient name: [name] [name] Patient MRN: [id number] Date of visit: 10/14/22

## 2022-10-19 ENCOUNTER — Telehealth: Payer: Self-pay | Admitting: Orthopedic Surgery

## 2022-10-19 ENCOUNTER — Other Ambulatory Visit: Payer: Self-pay

## 2022-10-19 DIAGNOSIS — G8929 Other chronic pain: Secondary | ICD-10-CM

## 2022-10-19 NOTE — Telephone Encounter (Signed)
Pt would like to know if it was okay for her to be referred to [facility] in [place] instead please advise

## 2022-10-19 NOTE — Addendum Note (Signed)
Addended by: Barbette Or on: 10/19/2022 04:31 PM   Modules accepted: Orders

## 2022-10-19 NOTE — Telephone Encounter (Signed)
Lvm advising pt the referral was placed in the chart

## 2022-10-19 NOTE — Addendum Note (Signed)
Addended by: Barbette Or on: 10/19/2022 04:30 PM   Modules accepted: Orders

## 2022-10-27 ENCOUNTER — Telehealth: Payer: Self-pay | Admitting: Orthopedic Surgery

## 2022-10-27 NOTE — Telephone Encounter (Signed)
Patient called wanting to know did the referral go through. VW#[phone]

## 2022-10-28 NOTE — Telephone Encounter (Signed)
Referral to [facility] at [phone] on 07/23 .

## 2022-11-02 ENCOUNTER — Ambulatory Visit (HOSPITAL_BASED_OUTPATIENT_CLINIC_OR_DEPARTMENT_OTHER): Payer: 59 | Admitting: Obstetrics & Gynecology

## 2022-11-06 ENCOUNTER — Telehealth: Payer: Self-pay | Admitting: Orthopedic Surgery

## 2022-11-06 MED ORDER — HYDROCODONE-ACETAMINOPHEN 5-325 MG PO TABS: 1.0000 | ORAL_TABLET | ORAL | 0 refills | Status: AC | PRN

## 2022-11-06 NOTE — Telephone Encounter (Signed)
Pt called in stating she would like pain meds called in until she can get in at [facility] on 08/22, Pt was referred

## 2022-12-01 ENCOUNTER — Ambulatory Visit (INDEPENDENT_AMBULATORY_CARE_PROVIDER_SITE_OTHER): Payer: 59 | Admitting: Obstetrics & Gynecology

## 2022-12-01 ENCOUNTER — Encounter (HOSPITAL_BASED_OUTPATIENT_CLINIC_OR_DEPARTMENT_OTHER): Payer: Self-pay | Admitting: Obstetrics & Gynecology

## 2022-12-01 VITALS — BP 134/76 | HR 91 | Ht 66.0 in | Wt 204.8 lb

## 2022-12-01 DIAGNOSIS — N8110 Cystocele, unspecified: Secondary | ICD-10-CM

## 2022-12-01 NOTE — Progress Notes (Signed)
GYNECOLOGY  VISIT  CC:   cystocele  HPI: 72 y.o. G1P1 Divorced Black or [place] American female here for follow up for h/o cystocele.  Pt is still not interested in surgery but does not feel PT helped as much as she needs.  Later in the day if she is active, she does have a lot of pressure and feels the bladder.  Interested in trying a pessary at this point as she does not want surgery if possible.    Past Medical History:  Diagnosis Date   Allergy    Anemia    Arthritis    Asthma    as child only   Back pain    Diabetes mellitus without complication (HCC)    Hypertension    Hypothyroidism     MEDS:   Current Outpatient Medications on File Prior to Visit  Medication Sig Dispense Refill   albuterol (VENTOLIN HFA) 108 (90 Base) MCG/ACT inhaler Inhale 2 puffs into the lungs every 4 (four) hours as needed.     amLODipine (NORVASC) 10 MG tablet Take 10 mg by mouth daily.     Calcium Carb-Cholecalciferol (CALTRATE 600+D3) 600-20 MG-MCG TABS Take 1 tablet every day by oral route for 90 days.     diazepam (VALIUM) 5 MG tablet Take one tablet by mouth with food one hour prior to procedure. May repeat 30 minutes prior if needed. 2 tablet 0   diclofenac (VOLTAREN) 75 MG EC tablet TAKE ONE TABLET TWICE DAILY 60 tablet 1   diclofenac Sodium (VOLTAREN) 1 % GEL APPLY 4 GRAMS TO THE AFFECTED AREA(S) BY TOPICAL ROUTE 4 TIMES PER DAY as needed     diphenhydrAMINE (BENADRYL) 25 MG tablet Take 0.5 tablets (12.5 mg total) by mouth at bedtime as needed (cramping). 30 tablet 0   fluticasone (FLONASE) 50 MCG/ACT nasal spray 1 SPRAY TO EACH NOSTRIL EVERY DAY     furosemide (LASIX) 20 MG tablet Take 20 mg by mouth daily as needed.     glimepiride (AMARYL) 1 MG tablet Take 0.5 mg by mouth daily with breakfast.     hydrochlorothiazide (HYDRODIURIL) 12.5 MG tablet Take 12.5 mg by mouth daily.     icosapent Ethyl (VASCEPA) 1 g capsule Take 2 g by mouth 2 (two) times daily.     ketoconazole (NIZORAL) 2 % cream  Apply 1 application topically 2 (two) times daily.     levothyroxine (SYNTHROID) 75 MCG tablet Take 75 mcg by mouth daily before breakfast.     methocarbamol (ROBAXIN) 500 MG tablet Take 1 tablet (500 mg total) by mouth 3 (three) times daily as needed. 30 tablet 0   Multiple Vitamins-Minerals (CERTAVITE SENIOR/ANTIOXIDANT) TABS Take 1 tablet by mouth daily.     oxyCODONE-acetaminophen (PERCOCET/ROXICET) 5-325 MG tablet Take 1 tablet by mouth every 8 (eight) hours as needed for severe pain. 15 tablet 0   potassium chloride (KLOR-CON) 10 MEQ tablet Take 10 mEq by mouth daily as needed.     rosuvastatin (CRESTOR) 20 MG tablet Take 1 tablet by mouth daily.     traMADol (ULTRAM) 50 MG tablet Take 1 tablet (50 mg total) by mouth every 6 (six) hours as needed. 30 tablet 0   [DISCONTINUED] atorvastatin (LIPITOR) 20 MG tablet Take 20 mg by mouth daily.      No current facility-administered medications on file prior to visit.    ALLERGIES: Gabapentin, Celecoxib, Metformin, and Flagyl [metronidazole]  SH:  divorced, non smoker  Review of Systems  Constitutional: Negative.  Genitourinary:        Vaginal pressure, at times Denies discharge/bleeding    PHYSICAL EXAMINATION:    BP 134/76 (BP Location: Right Arm, Patient Position: Sitting, Cuff Size: Large)   Pulse 91   Ht 5\' 6"  (1.676 m)   Wt 204 lb 12.8 oz (92.9 kg)   BMI 33.06 kg/m     General appearance: alert, cooperative and appears stated age Lymph:  no inguinal LAD noted  Pelvic: External genitalia:  no lesions              Urethra:  normal appearing urethra with no masses, tenderness or lesions              Bartholins and Skenes: normal                 Vagina: normal appearing vagina with normal color and discharge, no lesions              Cervix: absent              Bimanual Exam:  Uterus:  uterus absent              Adnexa: no mass, fullness, tenderness              Pessary fitting: #1 incontinence ring with support placed.   This was too small and easily removed.  #2 placed.  This fit was better but pt may need a softer pessary.  This was removed and pessary will be ordered.  Pt was comfortable with pessary in place.  Chaperone, Ina Homes, CMA, was present for exam.  Assessment/Plan: 1. Female cystocele - pessary will be ordered and pt will return for fitting/placement

## 2022-12-12 DIAGNOSIS — N8110 Cystocele, unspecified: Secondary | ICD-10-CM | POA: Insufficient documentation

## 2022-12-16 ENCOUNTER — Ambulatory Visit (INDEPENDENT_AMBULATORY_CARE_PROVIDER_SITE_OTHER): Payer: 59 | Admitting: Orthopedic Surgery

## 2022-12-16 DIAGNOSIS — M545 Low back pain, unspecified: Secondary | ICD-10-CM

## 2022-12-16 DIAGNOSIS — G8929 Other chronic pain: Secondary | ICD-10-CM

## 2022-12-16 NOTE — Progress Notes (Signed)
Orthopedic Spine Surgery Office Note   Assessment: Patient is a 72 y.o. female with chronic low back pain.  Has central stenosis from L1-L5 and PI-LL mismatch     Plan: -Patient has tried PT, ibuprofen, lumbar steroid injection, pain management -Patient is mostly having back pain and is not interested in surgery at this time. She also has noticed some improvement with pain management so I told her to stick with that treatment for now -She wanted me to continue to monitor her symptoms, which I said I would -Patient should return to office in 12 weeks, x-rays at next visit: none     Patient expressed understanding of the plan and all questions were answered to the patient's satisfaction.    ___________________________________________________________________________     History:   Patient is a 72 y.o. female who presents today for follow-up on her lumbar spine.  She is still having low back pain.  She notes that it is worse if she is walking for more than 5 to 6 minutes or if she is standing for more than 10 to 15 minutes.  She states that the pain gets better if she sits down.  It does not immediately get better but takes a few minutes and then the pain subsides.  After our last visit, she has established care with [facility] pain management.  She says her pain has gotten better but is still significant since starting treatment with them.  She is currently working with physical therapy.  Most of her pain is located in the back and she does not have significant pain radiating into the buttock or the lower extremities.  No bowel or bladder incontinence.  No saddle anesthesia.   Treatments tried: Physical therapy, ibuprofen, lumbar steroid injection, pain management     Physical Exam:   General: no acute distress, appears stated age Neurologic: alert, answering questions appropriately, following commands Respiratory: unlabored breathing on room air, symmetric chest rise Psychiatric:  appropriate affect, normal cadence to speech     MSK (spine):   -Strength exam                                                   Left                  Right EHL                              5/5                  5/5 TA                                 5/5                  5/5 GSC                             5/5                  5/5 Knee extension            5/5  5/5 Hip flexion                    5/5                  5/5   -Sensory exam                           Sensation intact to light touch in L3-S1 nerve distributions of bilateral lower extremities   -Achilles DTR: 1/4 on the left, 1/4 on the right -Patellar tendon DTR: 1/4 on the left, 1/4 on the right   -Straight leg raise: Negative bilaterally -Femoral nerve stretch test: Negative bilaterally -Clonus: no beats bilaterally   -Left hip exam: No pain through range of motion, negative Stinchfield, negative FABER -Right hip exam: No pain through range of motion, negative Stinchfield, negative FABER   Imaging: XR of the lumbar spine from 10/14/2022 was previously independently reviewed and interpreted, showing bilateral hip replacements.  No fracture or dislocation seen.  Disc height loss throughout the lumbar spine.  No evidence of instability on flexion/extension views.  Asymmetric disc height loss at L4-5 causing coronal curve with apex to the right.  PI of 67, LL of 39.   MRI of the lumbar spine from 09/05/2022 was previously independently reviewed and interpreted, showing central stenosis at L1/2.  Central and lateral recess stenosis on the left at L2/3.  Bilateral foraminal stenosis at L2/3.  Central and lateral recess stenosis at L3/4.  Bilateral foraminal stenosis at L3/4.  Central stenosis at L4/5 with anterolisthesis noted.  Right hemilaminotomy at L5/S1.     Patient name: [name] [name] Patient MRN: [id number] Date of visit: 12/16/22

## 2023-01-07 ENCOUNTER — Other Ambulatory Visit (HOSPITAL_BASED_OUTPATIENT_CLINIC_OR_DEPARTMENT_OTHER): Payer: Self-pay | Admitting: Obstetrics & Gynecology

## 2023-01-21 ENCOUNTER — Ambulatory Visit: Payer: 59 | Admitting: Physician Assistant

## 2023-02-01 ENCOUNTER — Telehealth (HOSPITAL_BASED_OUTPATIENT_CLINIC_OR_DEPARTMENT_OTHER): Payer: Self-pay | Admitting: Obstetrics & Gynecology

## 2023-02-01 ENCOUNTER — Ambulatory Visit (HOSPITAL_BASED_OUTPATIENT_CLINIC_OR_DEPARTMENT_OTHER): Payer: 59 | Admitting: Obstetrics & Gynecology

## 2023-02-01 NOTE — Telephone Encounter (Signed)
Called  patient she said she forgot she  just got back from a trip .

## 2023-02-08 ENCOUNTER — Telehealth: Payer: Self-pay

## 2023-02-08 ENCOUNTER — Encounter: Payer: Self-pay | Admitting: Orthopedic Surgery

## 2023-02-08 ENCOUNTER — Other Ambulatory Visit (INDEPENDENT_AMBULATORY_CARE_PROVIDER_SITE_OTHER): Payer: 59

## 2023-02-08 ENCOUNTER — Ambulatory Visit (INDEPENDENT_AMBULATORY_CARE_PROVIDER_SITE_OTHER): Payer: 59 | Admitting: Orthopedic Surgery

## 2023-02-08 DIAGNOSIS — M25562 Pain in left knee: Secondary | ICD-10-CM | POA: Diagnosis not present

## 2023-02-08 DIAGNOSIS — G8929 Other chronic pain: Secondary | ICD-10-CM | POA: Diagnosis not present

## 2023-02-08 DIAGNOSIS — M48062 Spinal stenosis, lumbar region with neurogenic claudication: Secondary | ICD-10-CM

## 2023-02-08 NOTE — Telephone Encounter (Signed)
Please precert for left knee gel injection. Dr.Moore's patient.

## 2023-02-08 NOTE — Progress Notes (Signed)
Orthopedic Spine Surgery Office Note   Assessment: Patient is a 72 y.o. female with two issues:  Chronic low back pain that radiates into bilateral buttocks.  Has central stenosis from L1-L5 and PI-LL mismatch Left knee pain. Has tricompartmental degenerative changes consistent with OA     Plan: -Patient has tried PT, ibuprofen, lumbar steroid injection, pain management -Patient states that her back pain and radiating buttock pain is currently tolerable, so recommended continue treatment with PT home exercise program and pain management -For her left knee, steroid injections have stopped providing her with any relief so I talked to her about her options.  I told her at this point, she could try HA injections or I could refer her to one of my partners for consideration of TKA.  She wanted to try HA injections so my office will work to get her set up for those -Since her pain is severe in that knee and is limiting her ability to do even small amounts of walking, she wanted to try another injection in the office. A steroid injection was performed in office today -Patient should return to office for HA injection to the left knee     Left knee injection note: After discussing the risk, benefits, alternatives of left knee intra-articular steroid injection, patient elected to proceed.  The patient was in the seated position with the knee at 90 degrees.  The anterior lateral soft spot was prepped with an alcohol based prep.  Ethyl chloride was used to anesthetize the skin.  1 cc of bupivacaine, 1 cc of lidocaine, 1 cc of Depo-Medrol was injected into the intra-articular space under standard sterile technique with a 20-gauge needle.  Needle was was withdrawn.  Band-Aid was applied.  Patient tolerated the procedure well.   ___________________________________________________________________________     History:   Patient is a 72 y.o. female who presents today for follow-up on her lumbar spine and for her  left knee.  Since her last visit, she has been working with physical therapy.  She is still been seeing pain management.  She notes that her pain has gotten better but she still having low back pain that radiates into the bilateral buttocks.  It does not radiate past the buttocks.  She states that she could tolerate this current amount of pain.  She is not interested in any surgery as it is currently tolerable.  She has not developed any new symptoms since our last visit.  She also wanted talk about her left knee.  She has had several years of left knee pain that has gotten worse with time.  She feels it on the medial and lateral aspects of the knee.  She notes it is worse with weightbearing and improves with rest.  She has a history of getting injections with steroids that have become less effective with time.  Her last injection did not give her any relief whatsoever.   Treatments tried: Physical therapy, ibuprofen, steroid injections, pain management     Physical Exam:   General: no acute distress, appears stated age Neurologic: alert, answering questions appropriately, following commands Respiratory: unlabored breathing on room air, symmetric chest rise Psychiatric: appropriate affect, normal cadence to speech     MSK (spine):   -Strength exam  Left                  Right EHL                              5/5                  5/5 TA                                 5/5                  5/5 GSC                             5/5                  5/5 Knee extension            5/5                  5/5 Hip flexion                    5/5                  5/5   -Sensory exam                           Sensation intact to light touch in L3-S1 nerve distributions of bilateral lower extremities   -Achilles DTR: 1/4 on the left, 1/4 on the right -Patellar tendon DTR: 1/4 on the left, 1/4 on the right   -Straight leg raise: Negative  bilaterally -Femoral nerve stretch test: Negative bilaterally -Clonus: no beats bilaterally   -Left knee exam: TTP over the medial and lateral joint lines, no other tenderness to palpation, small effusion, no pain through passive range of motion from 0-80, active range of motion from 0-95.  Knee stable to varus and valgus stress.  Negative Lachman.  Negative posterior drawer.   Imaging: XRs of the lumbar spine from 10/14/2022 were previously independently reviewed and interpreted, showing bilateral hip replacements.  No fracture or dislocation seen.  Disc height loss throughout the lumbar spine.  No evidence of instability on flexion/extension views.  Asymmetric disc height loss at L4-5 causing coronal curve with apex to the right.  PI of 67, LL of 39.   MRI of the lumbar spine from 09/05/2022 was previously independently reviewed and interpreted, showing central stenosis at L1/2.  Central and lateral recess stenosis on the left at L2/3.  Bilateral foraminal stenosis at L2/3.  Central and lateral recess stenosis at L3/4.  Bilateral foraminal stenosis at L3/4.  Central stenosis at L4/5 with anterolisthesis noted.  Right hemilaminotomy at L5/S1.  XRs of the left knee from 02/08/2023 were independently reviewed and interpreted, showing tricompartmental degenerative changes.  There is joint space narrowing and osteophyte formation seen in all 3 compartments.  Subchondral sclerosis seen particularly in the medial compartment.  No fracture or dislocation seen.    Patient name: [name] [name] Patient MRN: [id number] Date of visit: 02/08/23

## 2023-02-09 NOTE — Telephone Encounter (Signed)
VOB submitted for Durolane left knee

## 2023-03-17 ENCOUNTER — Ambulatory Visit: Payer: 59 | Admitting: Orthopedic Surgery

## 2023-04-14 ENCOUNTER — Ambulatory Visit: Payer: 59 | Admitting: Orthopedic Surgery

## 2023-04-14 DIAGNOSIS — M5416 Radiculopathy, lumbar region: Secondary | ICD-10-CM

## 2023-04-14 NOTE — Progress Notes (Signed)
 Orthopedic Spine Surgery Office Note   Assessment: Patient is a 73 y.o. female with low back pain that radiates into her bilateral buttock and into the left lower extremity along the anterolateral aspect. Has significant stenosis at L2/3 and L3/4     Plan: -Patient has tried PT, ibuprofen , lumbar steroid injection, pain management -Patient is not interested in surgical management at this time. She wanted to go over things again so I went over her symptoms and her MRI with her. She asked what the benefits of surgery would be. I explained that her radiating leg pain and buttock pain would more predictably get better with surgery but back pain relief would not be predictable with surgery -She has PI-LL mismatch on her films. She seems to be considering surgery so I want to get scoliosis films to better evaluate that at our next visit -Patient should return to office in 3 months, x-rays at next visit: scoliosis   ___________________________________________________________________________     History:   Patient is a 73 y.o. female who presents today for follow-up on her lumbar spine. She says that her knee pain is doing better since the injection so she wants to focus on her low back and radiating leg pain which is worse since last time she was seen.  She says she is having significant pain in her low back that goes into the bilateral buttocks and into the anterolateral aspect of the left thigh and leg.  She does not have any pain radiating into the right lower extremity distal to the buttock.  She said that the pain is worse if she is walking or standing for more than a couple minutes.  It gets better if she sits down but does not completely go away.  She is currently in pain management and says the pain is tolerable but she has noticed that has gotten worse within the last couple of weeks.  Denies paresthesia numbness.  No bowel or bladder incontinence.  No saddle anesthesia.   Treatments tried:  Physical therapy, ibuprofen , steroid injections, pain management     Physical Exam:   General: no acute distress, appears stated age Neurologic: alert, answering questions appropriately, following commands Respiratory: unlabored breathing on room air, symmetric chest rise Psychiatric: appropriate affect, normal cadence to speech     MSK (spine):   -Strength exam                                                   Left                  Right EHL                              5/5                  5/5 TA                                 5/5                  5/5 GSC  5/5                  5/5 Knee extension            5/5                  5/5 Hip flexion                    5/5                  5/5   -Sensory exam                           Sensation intact to light touch in L3-S1 nerve distributions of bilateral lower extremities   -Achilles DTR: 1/4 on the left, 1/4 on the right -Patellar tendon DTR: 1/4 on the left, 1/4 on the right   -Straight leg raise: Negative bilaterally -Femoral nerve stretch test: Negative bilaterally -Clonus: no beats bilaterally   Imaging: XRs of the lumbar spine from 10/14/2022 were previously independently reviewed and interpreted, showing bilateral hip replacements.  No fracture or dislocation seen.  Disc height loss throughout the lumbar spine.  No evidence of instability on flexion/extension views.  Asymmetric disc height loss at L4-5 causing coronal curve with apex to the right.  PI of 67, LL of 39.   MRI of the lumbar spine from 09/05/2022 was previously independently reviewed and interpreted, showing central stenosis at L1/2.  Central and lateral recess stenosis on the left at L2/3.  Bilateral foraminal stenosis at L2/3.  Central and lateral recess stenosis at L3/4.  Bilateral foraminal stenosis at L3/4.  Central stenosis at L4/5 with anterolisthesis noted.  Right hemilaminotomy at L5/S1.     Patient name: [name] [name] Patient MRN: [id number] Date of visit: 04/14/23

## 2023-04-20 ENCOUNTER — Telehealth: Payer: Self-pay | Admitting: Orthopedic Surgery

## 2023-04-20 ENCOUNTER — Ambulatory Visit: Payer: 59 | Admitting: Orthopedic Surgery

## 2023-04-20 ENCOUNTER — Other Ambulatory Visit: Payer: Self-pay

## 2023-04-20 DIAGNOSIS — M5416 Radiculopathy, lumbar region: Secondary | ICD-10-CM | POA: Diagnosis not present

## 2023-04-20 DIAGNOSIS — M48062 Spinal stenosis, lumbar region with neurogenic claudication: Secondary | ICD-10-CM | POA: Diagnosis not present

## 2023-04-20 NOTE — Telephone Encounter (Signed)
 Patient called and said that she isn't doing good at all and wants you to give her a call. WU#[phone]

## 2023-04-20 NOTE — Progress Notes (Signed)
 Orthopedic Spine Surgery Office Note   Assessment: Patient is a 73 y.o. female with low back pain that radiates into her bilateral lower extremities consistent with neurogenic claudication. She also has PI-LL mismatch with positive sagittal imbalance. Lumbar stenosis is seen at L2/3 and L3/4.      Plan: -Patient has tried PT, ibuprofen , lumbar steroid injection, pain management -She has tried multiple conservative treatments over the last year and she has tried non-operative treatments with me for over 5 months and her pain is getting worse, so discussed operative treatment as an option for her. After our conversation, she elected to proceed with surgery -Her stenosis is significant at L2/3 and L3/4 so this could be indirectly decompressed while giving her more lordosis via a lateral interbody to address both issues with a minimally invasive technique -Patient will next be seen at date of surgery    The patient has symptoms consistent with neurogenic claudication. The patient's symptoms were not improving with conservative treatment so operative management was discussed in the form of L2-4 lateral interbody fusion and posterior instrumented spinal fusion. The risks including but not limited to dural tear, nerve root injury, paralysis, pseudarthrosis, persistent pain, infection, bleeding, hardware failure/malposition, adjacent segment disease, vascular injury, psoas weakness, lumbar plexus injury, heart attack, death, stroke, fracture, and need for additional procedures were discussed with the patient. Since this will be an indirect decompression, there is a risk of persistent symptoms which the patient understood after explaining the reason.  The benefit of  the surgery would to be help with her radiating leg pain. I explained that back pain relief is not predictably relieved with surgery. The alternatives to surgical management were covered with the patient and included continued monitoring, physical  therapy, over-the-counter pain medications, ambulatory aids, injections, continue with pain management, and activity modification. All the patient's questions were answered to her satisfaction. After this discussion, the patient expressed understanding and elected to proceed with surgical intervention.    ___________________________________________________________________________     History:   Patient is a 73 y.o. female who presents today for follow-up on her lumbar spine. Patient comes back in to day because she has had worsening back and leg pain. She feels the pain starts in her low back and goes into her bilateral legs. She feels it is more significant in the left leg. She also feels it in bilateral buttock. On the right leg, she feels pain in her lateral thigh. On the left side, she feels the pain in the anterior thigh and lateral thigh. She then feels into going into the anterior leg. She notes the leg pain particularly when walking or standing. She said she can walk for about 3 minutes before the pain starts. It gets better if she sits down but it does not completely go away. She feels the pain equally in her back and legs. Denies paresthesia numbness.  No bowel or bladder incontinence.  No saddle anesthesia.   Treatments tried: Physical therapy, ibuprofen , steroid injections, pain management     Physical Exam:   General: no acute distress, appears stated age Neurologic: alert, answering questions appropriately, following commands Respiratory: unlabored breathing on room air, symmetric chest rise Psychiatric: appropriate affect, normal cadence to speech     MSK (spine):   -Strength exam  Left                  Right EHL                              5/5                  5/5 TA                                 5/5                  5/5 GSC                             5/5                  5/5 Knee extension            5/5                   5/5 Hip flexion                    5/5                  5/5   -Sensory exam                           Sensation intact to light touch in L3-S1 nerve distributions of bilateral lower extremities   -Achilles DTR: 1/4 on the left, 1/4 on the right -Patellar tendon DTR: 1/4 on the left, 1/4 on the right   -Straight leg raise: Negative bilaterally -Femoral nerve stretch test: Negative bilaterally -Clonus: no beats bilaterally -Palpable DP pulses bilaterally   Imaging: XRs of the lumbar spine from 10/14/2022 were previously independently reviewed and interpreted, showing bilateral hip replacements.  No fracture or dislocation seen.  Disc height loss throughout the lumbar spine.  No evidence of instability on flexion/extension views.  Asymmetric disc height loss at L4-5 causing coronal curve with apex to the right.   MRI of the lumbar spine from 09/05/2022 was previously independently reviewed and interpreted, showing central stenosis at L1/2.  Central and lateral recess stenosis on the left at L2/3.  Bilateral foraminal stenosis at L2/3.  Central and lateral recess stenosis at L3/4.  Bilateral foraminal stenosis at L3/4.  Central stenosis at L4/5 with anterolisthesis noted.  Right hemilaminotomy at L5/S1.  XR scoli films from 04/20/2023 were independently reviewed and interpreted, showing PI of 55, LL of 37 (mismatch of 18). 2.5cm from CSVL. SVA of 7.6cm. T1 pelvis angle of 25.   DEXA scan from 02/21/2021 showed a T score of 0.7     Patient name: [name] [name] Patient MRN: [id number] Date of visit: 04/20/23  Pre-operative Scores  ODI: 46% VAS back: 8/10 VAS leg: 8/10

## 2023-04-20 NOTE — Telephone Encounter (Signed)
 I called added her to today's schedule for Dr. Christell Constant to see.

## 2023-04-21 ENCOUNTER — Telehealth: Payer: Self-pay | Admitting: Orthopedic Surgery

## 2023-04-21 NOTE — Telephone Encounter (Signed)
 Pt called requesting a call from [place]. Pt states she has medical questions about surgery. Pt phone number is [phone].

## 2023-04-22 NOTE — Telephone Encounter (Signed)
I called and lmom for her to call us back.

## 2023-04-23 ENCOUNTER — Telehealth: Payer: Self-pay | Admitting: Orthopedic Surgery

## 2023-04-23 NOTE — Telephone Encounter (Signed)
Patient called this morning to say she changed her mind about having surgery. She is extremely scared to have surgery. She would like doctor to call her and talk about it with her. Possible to easy her mind.

## 2023-04-27 NOTE — Telephone Encounter (Signed)
I called and lmom for pt again to call us back with a detailed message and I would call her back.

## 2023-04-28 ENCOUNTER — Other Ambulatory Visit: Payer: Self-pay | Admitting: Internal Medicine

## 2023-04-28 ENCOUNTER — Encounter: Payer: Self-pay | Admitting: Internal Medicine

## 2023-04-28 DIAGNOSIS — Z1231 Encounter for screening mammogram for malignant neoplasm of breast: Secondary | ICD-10-CM

## 2023-04-28 DIAGNOSIS — Z13820 Encounter for screening for osteoporosis: Secondary | ICD-10-CM

## 2023-05-06 ENCOUNTER — Ambulatory Visit: Payer: 59 | Admitting: Orthopaedic Surgery

## 2023-05-21 ENCOUNTER — Telehealth: Payer: Self-pay | Admitting: Orthopedic Surgery

## 2023-05-21 NOTE — Telephone Encounter (Signed)
I called and advised patient of Dr. Kathi Der Message below, she said ok

## 2023-05-21 NOTE — Telephone Encounter (Signed)
Pt called requesting a call from [place]. Pt states she has a few question about procedure discussed with Dr Christell Constant. Pt phone number is [phone].

## 2023-05-21 NOTE — Telephone Encounter (Signed)
Patient is wanting to know if you do the procedure called Mild procedure for spinal stenosis. She states that pain management gave a small info packet about it.  Please advise

## 2023-06-02 ENCOUNTER — Telehealth: Payer: Self-pay | Admitting: Orthopedic Surgery

## 2023-06-02 NOTE — Telephone Encounter (Signed)
 Patient wants to know if she can get the disc from the [facility] and xrays.  Patient also want a referral to [facility]

## 2023-06-03 ENCOUNTER — Other Ambulatory Visit: Payer: Self-pay | Admitting: Radiology

## 2023-06-03 DIAGNOSIS — G8929 Other chronic pain: Secondary | ICD-10-CM

## 2023-06-03 DIAGNOSIS — M5416 Radiculopathy, lumbar region: Secondary | ICD-10-CM

## 2023-06-03 DIAGNOSIS — M48062 Spinal stenosis, lumbar region with neurogenic claudication: Secondary | ICD-10-CM

## 2023-06-03 NOTE — Telephone Encounter (Signed)
 Can you please put her images from our office on a CD and let her know when it is ready for pick up?  She is aware that the referral has been made and that she has to get her MRI from [facility].

## 2023-06-03 NOTE — Telephone Encounter (Signed)
 Patient aware CD is ready for pick up at front desk

## 2023-06-21 ENCOUNTER — Ambulatory Visit (HOSPITAL_BASED_OUTPATIENT_CLINIC_OR_DEPARTMENT_OTHER): Payer: 59 | Admitting: Obstetrics & Gynecology

## 2023-06-21 ENCOUNTER — Encounter (HOSPITAL_BASED_OUTPATIENT_CLINIC_OR_DEPARTMENT_OTHER): Payer: Self-pay | Admitting: Obstetrics & Gynecology

## 2023-06-21 VITALS — BP 144/68 | HR 75 | Ht 68.5 in | Wt 206.2 lb

## 2023-06-21 DIAGNOSIS — N6311 Unspecified lump in the right breast, upper outer quadrant: Secondary | ICD-10-CM

## 2023-06-21 DIAGNOSIS — N644 Mastodynia: Secondary | ICD-10-CM

## 2023-06-21 NOTE — Progress Notes (Addendum)
 GYNECOLOGY  VISIT  CC:   cystocele  HPI: 73 y.o. G1P1 Divorced Black or [place] American female here concerns about new breast pain that has been present for about two weeks.  No recent trauma.  Last MMG 05/2022 but not scheduled until September.  Dr. Corky Downs wanted this scheduled with DEXA scan.  She wanted evaluated for breast pain.    Un related, she has been diagnosed with spinal stenosis.  Has seen Dr. Christell Constant and he recommended surgical treatment that would last about 10 hours.  She has no interest in doing this.  Has consult with Dr. Lovell Sheehan for second opinion and to consider a different less invasive surgical procedure.    She does have a cystocele and pessary was ordered back last year.  She doesn't want this placed today as she is unsure if she will need surgery soon or not.  Has urinary urgency and frequency.  Denies dysuria.  Her bladder function is stable for her.  Denies blood in her urine.    Past Medical History:  Diagnosis Date   Allergy    Anemia    Arthritis    Asthma    as child only   Back pain    Diabetes mellitus without complication (HCC)    Hypertension    Hypothyroidism     MEDS:   Current Outpatient Medications on File Prior to Visit  Medication Sig Dispense Refill   albuterol (VENTOLIN HFA) 108 (90 Base) MCG/ACT inhaler Inhale 2 puffs into the lungs every 4 (four) hours as needed.     amLODipine (NORVASC) 10 MG tablet Take 10 mg by mouth daily.     Calcium Carb-Cholecalciferol (CALTRATE 600+D3) 600-20 MG-MCG TABS Take 1 tablet every day by oral route for 90 days.     diazepam (VALIUM) 5 MG tablet Take one tablet by mouth with food one hour prior to procedure. May repeat 30 minutes prior if needed. 2 tablet 0   diclofenac (VOLTAREN) 75 MG EC tablet TAKE ONE TABLET TWICE DAILY 60 tablet 1   diclofenac Sodium (VOLTAREN) 1 % GEL APPLY 4 GRAMS TO THE AFFECTED AREA(S) BY TOPICAL ROUTE 4 TIMES PER DAY as needed     diphenhydrAMINE (BENADRYL) 25 MG tablet Take 0.5  tablets (12.5 mg total) by mouth at bedtime as needed (cramping). 30 tablet 0   fluticasone (FLONASE) 50 MCG/ACT nasal spray 1 SPRAY TO EACH NOSTRIL EVERY DAY     furosemide (LASIX) 20 MG tablet Take 20 mg by mouth daily as needed.     glimepiride (AMARYL) 1 MG tablet Take 0.5 mg by mouth daily with breakfast.     hydrochlorothiazide (HYDRODIURIL) 12.5 MG tablet Take 12.5 mg by mouth daily.     icosapent Ethyl (VASCEPA) 1 g capsule Take 2 g by mouth 2 (two) times daily.     ketoconazole (NIZORAL) 2 % cream Apply 1 application topically 2 (two) times daily.     levothyroxine (SYNTHROID) 75 MCG tablet Take 75 mcg by mouth daily before breakfast.     methocarbamol (ROBAXIN) 500 MG tablet Take 1 tablet (500 mg total) by mouth 3 (three) times daily as needed. 30 tablet 0   Multiple Vitamins-Minerals (CERTAVITE SENIOR/ANTIOXIDANT) TABS Take 1 tablet by mouth daily.     oxyCODONE-acetaminophen (PERCOCET/ROXICET) 5-325 MG tablet Take 1 tablet by mouth every 8 (eight) hours as needed for severe pain. 15 tablet 0   potassium chloride (KLOR-CON) 10 MEQ tablet Take 10 mEq by mouth daily as needed.  rosuvastatin (CRESTOR) 20 MG tablet Take 1 tablet by mouth daily.     traMADol (ULTRAM) 50 MG tablet Take 1 tablet (50 mg total) by mouth every 6 (six) hours as needed. 30 tablet 0   [DISCONTINUED] atorvastatin (LIPITOR) 20 MG tablet Take 20 mg by mouth daily.      No current facility-administered medications on file prior to visit.    ALLERGIES: Gabapentin, Celecoxib, Metformin, and Flagyl [metronidazole]  SH:  divorced, non smoker  Review of Systems  Constitutional: Negative.   Cardiovascular:        Breast pain on right breaset  Genitourinary: Negative.     PHYSICAL EXAMINATION:    BP (!) 144/68 (BP Location: Right Arm, Patient Position: Sitting, Cuff Size: Large)   Pulse 75   Ht 5' 8.5" (1.74 m) Comment: Reported  Wt 206 lb 3.2 oz (93.5 kg)   BMI 30.90 kg/m     Physical  Exam Constitutional:      Appearance: Normal appearance.  Chest:  Breasts:    Right: Mass and tenderness present. No swelling, bleeding, inverted nipple, nipple discharge or skin change.     Left: No swelling, bleeding, inverted nipple, mass, nipple discharge, skin change or tenderness.    Lymphadenopathy:     Upper Body:     Right upper body: No supraclavicular, axillary or pectoral adenopathy.     Left upper body: No supraclavicular, axillary or pectoral adenopathy.  Neurological:     Mental Status: She is alert.      Assessment/Plan: 1. Breast pain (Primary) - MM 3D DIAGNOSTIC MAMMOGRAM BILATERAL BREAST; Future - [place] LIMITED ULTRASOUND INCLUDING AXILLA RIGHT BREAST; Future  2. Mass of upper outer quadrant of right breast - MM 3D DIAGNOSTIC MAMMOGRAM BILATERAL BREAST; Future - [place] LIMITED ULTRASOUND INCLUDING AXILLA RIGHT BREAST; Future

## 2023-06-30 ENCOUNTER — Other Ambulatory Visit: Payer: Self-pay | Admitting: Neurosurgery

## 2023-06-30 DIAGNOSIS — M4316 Spondylolisthesis, lumbar region: Secondary | ICD-10-CM

## 2023-07-05 ENCOUNTER — Other Ambulatory Visit

## 2023-07-05 ENCOUNTER — Encounter

## 2023-07-07 ENCOUNTER — Other Ambulatory Visit

## 2023-07-07 ENCOUNTER — Encounter

## 2023-07-10 ENCOUNTER — Ambulatory Visit: Admission: EM | Admit: 2023-07-10 | Discharge: 2023-07-10 | Disposition: A | Discharge status: Home / Self Care

## 2023-07-10 ENCOUNTER — Other Ambulatory Visit: Payer: Self-pay

## 2023-07-10 ENCOUNTER — Ambulatory Visit

## 2023-07-10 DIAGNOSIS — M25552 Pain in left hip: Secondary | ICD-10-CM | POA: Diagnosis not present

## 2023-07-10 DIAGNOSIS — M25551 Pain in right hip: Secondary | ICD-10-CM

## 2023-07-10 MED ORDER — HYDROCODONE-ACETAMINOPHEN 7.5-325 MG PO TABS
1.0000 | ORAL_TABLET | Freq: Every day | ORAL | 0 refills | Status: DC | PRN
Start: 2023-07-10 — End: 2024-02-07

## 2023-07-10 NOTE — ED Triage Notes (Signed)
 Pt c/o LT hip pain since Thurs night. Denies injury. Hx of hip replacement. Taking hydrocodone and ibuprofen prn. Also takes flexeril prn. Pain 6/10

## 2023-07-10 NOTE — Discharge Instructions (Addendum)
 Advised patient of bilateral hip x-ray results with hardcopy provided.  Advised patient to take medication daily or as needed for bilateral hip pain.  Patient advised of sedative effects of this medication.  Encouraged increase daily water intake to 64 ounces per day 7 days/week.  Advised if symptoms worsen and/or unresolved please follow-up with your PCP for further evaluation.

## 2023-07-10 NOTE — ED Provider Notes (Signed)
 [facility]    CSN: [id number] Arrival date & time: 07/10/23  1034      History   Chief Complaint Chief Complaint  Patient presents with   Hip Pain    LT    HPI [name] [name] is a 73 y.o. female.   HPI PMH significant for lumbar stenosis with neurogenic claudication, obesity, HTN, and T2DM without complication.  PDMP reveals Hydrocodone 5/325 mg #60 filled on 06/21/2023.  Patient reports currently taking this medication as well as 600 mg of ibuprofen daily which does not cover her lower back pain completely.  Patient reports pain is still persistent especially at night when current pain medication with current dosage and Ibuprofen 600 mg do not provide lower back pain relief. Past Medical History:  Diagnosis Date   Allergy    Anemia    Arthritis    Asthma    as child only   Back pain    Diabetes mellitus without complication (HCC)    Hypertension    Hypothyroidism     Patient Active Problem List   Diagnosis Date Noted   Female cystocele 12/12/2022   Facet arthropathy, lumbosacral 06/11/2022   Primary osteoarthritis of left shoulder 01/14/2022   Status post left hip replacement 01/10/2021   Vitamin D deficiency 06/27/2020   Peripheral sensory neuropathy due to type 2 diabetes mellitus (HCC) 12/01/2019   Mild intermittent asthma 12/01/2019   Myositis 12/01/2019   Status post total replacement of right hip 11/18/2018   Spinal stenosis of lumbar region with neurogenic claudication 04/20/2018   Impingement syndrome of right shoulder 04/20/2018   Type II diabetes mellitus, uncontrolled 11/05/2017   Mixed hyperlipidemia 06/04/2017   Acquired hypothyroidism 03/05/2017   Normocytic anemia 01/24/2017   Bilateral carpal tunnel syndrome 05/06/2016   Essential hypertension, benign 02/20/2016   Primary osteoarthritis of left knee 05/24/2014   Elevated blood pressure reading 09/29/2011   Chronic back pain 09/29/2011   Leg edema 09/29/2011   Obesity with body mass  index 30 or greater 09/29/2011    Past Surgical History:  Procedure Laterality Date   ABDOMINAL HYSTERECTOMY  1989   fibroid-no longer needs pap   BACK SURGERY  1987, 1985   DDD   CARPAL TUNNEL RELEASE Left 05/28/2016   Procedure: CARPAL TUNNEL RELEASE;  Surgeon: Cindee Salt, MD;  Location: [facility];  Service: Orthopedics;  Laterality: Left;   CARPAL TUNNEL RELEASE Right 03/17/2018   Procedure: CARPAL TUNNEL RELEASE;  Surgeon: Cindee Salt, MD;  Location: [facility];  Service: Orthopedics;  Laterality: Right;   TOTAL HIP ARTHROPLASTY Right 11/18/2018   Procedure: RIGHT TOTAL HIP ARTHROPLASTY ANTERIOR APPROACH;  Surgeon: Kathryne Hitch, MD;  Location: [facility];  Service: Orthopedics;  Laterality: Right;   TOTAL HIP ARTHROPLASTY Left 01/10/2021   Procedure: LEFT TOTAL HIP ARTHROPLASTY ANTERIOR APPROACH;  Surgeon: Kathryne Hitch, MD;  Location: [facility];  Service: Orthopedics;  Laterality: Left;   TUBAL LIGATION      OB History     Gravida  1   Para  1   Term      Preterm      AB      Living         SAB      IAB      Ectopic      Multiple      Live Births               Home Medications  Prior to Admission medications   Medication Sig Start Date End Date Taking? Authorizing Provider  HYDROcodone-acetaminophen (NORCO) 7.5-325 MG tablet Take 1 tablet by mouth daily as needed for severe pain (pain score 7-10). 07/10/23  Yes Trevor Iha, FNP  albuterol (VENTOLIN HFA) 108 (90 Base) MCG/ACT inhaler Inhale 2 puffs into the lungs every 4 (four) hours as needed.    [provider]  amLODipine (NORVASC) 10 MG tablet Take 10 mg by mouth daily. 04/10/20   [provider]  Calcium Carb-Cholecalciferol (CALTRATE 600+D3) 600-20 MG-MCG TABS Take 1 tablet every day by oral route for 90 days. 07/04/20   [provider]  diazepam (VALIUM) 5 MG tablet Take one tablet by mouth with food one hour prior to  procedure. May repeat 30 minutes prior if needed. 05/04/22   Juanda Chance, NP  diclofenac (VOLTAREN) 75 MG EC tablet TAKE ONE TABLET TWICE DAILY 09/21/22   Kathryne Hitch, MD  diclofenac Sodium (VOLTAREN) 1 % GEL APPLY 4 GRAMS TO THE AFFECTED AREA(S) BY TOPICAL ROUTE 4 TIMES PER DAY as needed 01/05/22   [provider]  diphenhydrAMINE (BENADRYL) 25 MG tablet Take 0.5 tablets (12.5 mg total) by mouth at bedtime as needed (cramping). 09/30/19   Jacalyn Lefevre, MD  fluticasone (FLONASE) 50 MCG/ACT nasal spray 1 SPRAY TO EACH NOSTRIL EVERY DAY    [provider]  furosemide (LASIX) 20 MG tablet Take 20 mg by mouth daily as needed. 12/16/21   [provider]  glimepiride (AMARYL) 1 MG tablet Take 0.5 mg by mouth daily with breakfast.    [provider]  hydrochlorothiazide (HYDRODIURIL) 12.5 MG tablet Take 12.5 mg by mouth daily. 12/31/21   [provider]  icosapent Ethyl (VASCEPA) 1 g capsule Take 2 g by mouth 2 (two) times daily. 05/16/20   [provider]  ketoconazole (NIZORAL) 2 % cream Apply 1 application topically 2 (two) times daily. 12/24/20   [provider]  levothyroxine (SYNTHROID) 75 MCG tablet Take 75 mcg by mouth daily before breakfast.    [provider]  losartan (COZAAR) 25 MG tablet     [provider]  methocarbamol (ROBAXIN) 500 MG tablet Take 1 tablet (500 mg total) by mouth 3 (three) times daily as needed. 01/14/22   Trevor Iha, FNP  Multiple Vitamins-Minerals (CERTAVITE SENIOR/ANTIOXIDANT) TABS Take 1 tablet by mouth daily.    [provider]  potassium chloride (KLOR-CON) 10 MEQ tablet Take 10 mEq by mouth daily as needed. 12/16/21   [provider]  rosuvastatin (CRESTOR) 20 MG tablet Take 1 tablet by mouth daily. 11/20/21   [provider]  atorvastatin (LIPITOR) 20 MG tablet Take 20 mg by mouth daily.  07/15/17 09/30/19  [provider]    Family  History Family History  Problem Relation Age of Onset   Asthma Mother    Cancer Mother        AML   Leukemia Mother    Heart disease Father    Hypertension Father    Kidney disease Father    Heart attack Brother    Breast cancer Maternal Aunt    Multiple sclerosis Maternal Aunt     Social History Social History   Tobacco Use   Smoking status: Never   Smokeless tobacco: Never  Vaping Use   Vaping status: Never Used  Substance Use Topics   Alcohol use: Yes    Comment: social   Drug use: No     Allergies  Gabapentin, Celecoxib, Metformin, and Flagyl [metronidazole]   Review of Systems Review of Systems  Musculoskeletal:        Hip pain     Physical Exam Triage Vital Signs ED Triage Vitals  Encounter Vitals Group     BP      Systolic BP Percentile      Diastolic BP Percentile      Pulse      Resp      Temp      Temp src      SpO2      Weight      Height      Head Circumference      Peak Flow      Pain Score      Pain Loc      Pain Education      Exclude from Growth Chart    No data found.  Updated Vital Signs BP (!) 168/88 (BP Location: Right Arm)   Pulse 84   Temp 98.7 F (37.1 C) (Oral)   Resp 17   SpO2 99%    Physical Exam Vitals and nursing note reviewed.  Constitutional:      General: She is not in acute distress.    Appearance: Normal appearance. She is obese. She is not ill-appearing, toxic-appearing or diaphoretic.  HENT:     Head: Normocephalic and atraumatic.     Mouth/Throat:     Mouth: Mucous membranes are moist.     Pharynx: Oropharynx is clear.  Eyes:     Extraocular Movements: Extraocular movements intact.     Conjunctiva/sclera: Conjunctivae normal.     Pupils: Pupils are equal, round, and reactive to light.  Cardiovascular:     Rate and Rhythm: Normal rate and regular rhythm.     Pulses: Normal pulses.     Heart sounds: Normal heart sounds.  Pulmonary:     Effort: Pulmonary effort is normal.     Breath sounds:  Normal breath sounds. No wheezing, rhonchi or rales.  Chest:     Chest Meckel: No tenderness.  Musculoskeletal:        General: Normal range of motion.     Cervical back: Normal range of motion and neck supple.     Comments: Patient reporting bilateral hip/lower back pain as 7/10.  Exam limited due to pain.  Skin:    General: Skin is warm and dry.  Neurological:     General: No focal deficit present.     Mental Status: She is alert and oriented to person, place, and time. Mental status is at baseline.  Psychiatric:        Mood and Affect: Mood normal.        Behavior: Behavior normal.        Thought Content: Thought content normal.      UC Treatments / Results  Labs (all labs ordered are listed, but only abnormal results are displayed) Labs Reviewed - No data to display  EKG   Radiology DG Hips Bilat W or Wo Pelvis 2 Views Result Date: 07/10/2023 CLINICAL DATA:  Bilateral hip pain for 1 week. New onset left hip pain. EXAM: DG HIP (WITH OR WITHOUT PELVIS) 2V BILAT COMPARISON:  None Available. FINDINGS: SI joints and symphysis pubis unremarkable. No evidence for an acute fracture involving the bony pelvis. No worrisome lytic or sclerotic osseous abnormality. Patient is status post bilateral hip replacement without complicating features. IMPRESSION: 1. Status post bilateral hip replacement without complicating features. 2. No acute  bony findings. Electronically Signed   By: Kennith Center M.D.   On: 07/10/2023 11:29    Procedures Procedures (including critical care time)  Medications Ordered in UC Medications - No data to display  Initial Impression / Assessment and Plan / UC Course  I have reviewed the triage vital signs and the nursing notes.  Pertinent labs & imaging results that were available during my care of the patient were reviewed by me and considered in my medical decision making (see chart for details).     MDM: 1.  Bilateral hip pain-bilateral hip x-ray results  revealed above patient advised.  Rx'd Norco 7.5/325 mg: Take 1 tablet daily, as needed for severe pain of lower back (7-10).  Patient advised of sedative effects of this medicine especially when combined with current dose of hydrocodone. Advised patient of bilateral hip x-ray results with hardcopy provided.  Advised patient to take medication daily or as needed for bilateral hip pain.  Patient advised of sedative effects of this medication.  Encouraged increase daily water intake to 64 ounces per day 7 days/week.  Advised if symptoms worsen and/or unresolved please follow-up with your PCP for further evaluation.  Patient discharged home, hemodynamically stable. Final Clinical Impressions(s) / UC Diagnoses   Final diagnoses:  Bilateral hip pain     Discharge Instructions      Advised patient of bilateral hip x-ray results with hardcopy provided.  Advised patient to take medication daily or as needed for bilateral hip pain.  Patient advised of sedative effects of this medication.  Encouraged increase daily water intake to 64 ounces per day 7 days/week.  Advised if symptoms worsen and/or unresolved please follow-up with your PCP for further evaluation.     ED Prescriptions     Medication Sig Dispense Auth. Provider   HYDROcodone-acetaminophen (NORCO) 7.5-325 MG tablet Take 1 tablet by mouth daily as needed for severe pain (pain score 7-10). 12 tablet Trevor Iha, FNP      I have reviewed the PDMP during this encounter.   Trevor Iha, FNP 07/10/23 1202

## 2023-07-12 NOTE — Progress Notes (Signed)
   07/12/2023  Patient ID: [name] [name], female   DOB: 1950-04-14, 73 y.o.   MRN: [id number]  MAC Medication Fails Report:   - rosuvastatin 40 mg daily LF on 04/28/2023 x90 days and 1 RF. Follow up prior to next fill to make sure patient gets prescription  Cephus Shelling, PharmD Clinical Pharmacist Cell: [phone]

## 2023-07-19 ENCOUNTER — Ambulatory Visit: Admitting: Orthopedic Surgery

## 2023-07-26 ENCOUNTER — Other Ambulatory Visit: Payer: Self-pay

## 2023-07-26 DIAGNOSIS — Z79899 Other long term (current) drug therapy: Secondary | ICD-10-CM

## 2023-07-26 NOTE — Progress Notes (Signed)
   07/26/2023 Name: [name] [name] MRN: [id number] DOB: Sep 25, 1950  Chief Complaint  Patient presents with   Medication Adherence    Robinette  A Carbine is a 73 y.o. year old female who presented for a telephone visit.   They were referred to the pharmacist by a quality report for assistance in managing complex medication management.    Subjective:   Medication Access/Adherence  Current Pharmacy:  [facility] [address], [place] - [address] [place] [address] Phone: [phone] Fax: [phone]  [facility] [place], [place] - [address] [place] [address] Phone: [phone] Fax: [phone]  Rosuvastatin  40 mg daily: Last filled on 07/21/2023 for 90 days with no remaining refills. A follow-up will be needed before the next fill to ensure a new prescription is sent. Statin Tolerance History: Per chart review, the patient was previously unable to tolerate atorvastatin  (Lipitor) due to muscle pain. Rosuvastatin  (Crestor ) was later restarted. Losartan Dose Discrepancy: The patient has both 25 mg and 50 mg strengths prescribed. Dr. Anson Basta shows that losartan 25 mg was filled on 07/21/2023. At the last office visit, the dose was increased to 50 mg daily, but the patient was unaware of this change and picked up the 25 mg tablets. I contacted [facility], and informed the patient that she will need to call the pharmacy to refill the 50 mg dose. I also discussed with the patient the option to take two 25 mg tablets daily in the meantime and informed her that I would reach out to the PCP for confirmation and clarity. Will contact practice staff regarding patient adherence.    Alexandria Angel, PharmD Clinical Pharmacist Cell: [phone]

## 2023-08-04 ENCOUNTER — Ambulatory Visit
Admission: RE | Admit: 2023-08-04 | Discharge: 2023-08-04 | Disposition: A | Discharge status: Home / Self Care | Source: Ambulatory Visit | Attending: Obstetrics & Gynecology | Admitting: Obstetrics & Gynecology

## 2023-08-04 DIAGNOSIS — N644 Mastodynia: Secondary | ICD-10-CM

## 2023-08-04 DIAGNOSIS — N6311 Unspecified lump in the right breast, upper outer quadrant: Secondary | ICD-10-CM

## 2023-09-06 NOTE — Progress Notes (Signed)
   09/06/2023 Name: [name] [name] MRN: [id number] DOB: 1950/07/06  No chief complaint on file.   [name] [name] is a 73 y.o. year old female who presented for a telephone visit.   They were referred to the pharmacist by a quality report for assistance in managing complex medication management.    Subjective:   Medication Access/Adherence  Current Pharmacy:  [facility] [address], [place] - [address] [place] [address] Phone: [phone] Fax: [phone]  [facility] [place], [place] - [address] [place] [address] Phone: [phone] Fax: [phone]  Rosuvastatin  40 mg daily: Last filled on 07/21/2023 for 90 days with no remaining refills. A follow-up will be needed before the next fill to ensure a new prescription is sent. Statin Tolerance History: Per chart review, the patient was previously unable to tolerate atorvastatin  (Lipitor) due to muscle pain. Rosuvastatin  (Crestor ) was later restarted. Losartan Dose Discrepancy: The patient has both 25 mg and 50 mg strengths prescribed. Dr. Anson Basta shows that losartan 25 mg was filled on 07/21/2023. Per discussion with PCP at the office, the patient called the clinic and informed of the dose change again. He also stated patient was made aware of initial dose change. Patient also was upset that pharmacist called regarding medication adherence and dose change as she wasn't aware. PCP will follow up on dose change. Per Dr. Anson Basta, losartan 50 mg has not been filled yet.   Will contact practice staff regarding patient adherence.    Alexandria Angel, PharmD Clinical Pharmacist Cell: [phone]

## 2023-09-21 ENCOUNTER — Other Ambulatory Visit (HOSPITAL_COMMUNITY)

## 2023-09-28 ENCOUNTER — Other Ambulatory Visit (HOSPITAL_COMMUNITY)

## 2023-09-28 ENCOUNTER — Encounter (HOSPITAL_COMMUNITY): Payer: Self-pay

## 2023-10-25 ENCOUNTER — Telehealth: Payer: Self-pay

## 2023-10-25 NOTE — Progress Notes (Addendum)
   10/25/2023  Patient ID: [name]  A [name], female   DOB: [birth year], 73 y.o.   MRN: [id number]  This patient is appearing on a report for being at risk of failing the adherence measure for identified medications this calendar year.   Medication Adherence Summary (STAR/HEDIS Monitoring): Adherence Category: cholesterol (statin) and hypertension (ACEi/ARB)    Drug Name: Losartan 50 mg Last Fill or Sold Date:09/07/2023 Days' Supply: 90   Drug Name: Rosuvastatin  40 mg  Last [place] or Sold Date: 07/21/2023 Days' Supply: 90    Notes: ? Adherence data pulled from pharmacy claims portal Dr. Annemarie.  ? Plan: Will collaborate with embedded pharmacist to reach out to patient regarding rosuvastatin  fill.   Dorcas Solian, PharmD Clinical Pharmacist Cell: [phone]

## 2023-11-02 ENCOUNTER — Telehealth: Payer: Self-pay | Admitting: Orthopedic Surgery

## 2023-11-02 NOTE — Telephone Encounter (Signed)
 Patient called and wants you to put in for her to get the Gel shot. RA#[phone]

## 2023-11-11 NOTE — Telephone Encounter (Signed)
 VOB submitted for Durolane, left knee.

## 2023-12-07 ENCOUNTER — Telehealth: Payer: Self-pay

## 2023-12-07 NOTE — Telephone Encounter (Signed)
 Patient scheduled for duroland left knee B&B no copay

## 2023-12-15 ENCOUNTER — Ambulatory Visit: Payer: 59

## 2023-12-15 ENCOUNTER — Telehealth: Payer: Self-pay

## 2023-12-15 ENCOUNTER — Other Ambulatory Visit: Payer: 59

## 2023-12-15 NOTE — Progress Notes (Signed)
   12/15/2023  Patient ID: [name] [name], female   DOB: 1950-11-15, 73 y.o.   MRN: [id number]  Contacted patient regarding medication adherence from a quality report for [facility]. The patient is at risk of failing [facility] and [facility].    I left patient a voicemail to return my call at their convenience.   Heather Factor, PharmD Clinical Pharmacist  [phone]

## 2023-12-16 ENCOUNTER — Ambulatory Visit: Admitting: Orthopedic Surgery

## 2023-12-16 ENCOUNTER — Other Ambulatory Visit (INDEPENDENT_AMBULATORY_CARE_PROVIDER_SITE_OTHER): Payer: Self-pay

## 2023-12-16 DIAGNOSIS — M5416 Radiculopathy, lumbar region: Secondary | ICD-10-CM

## 2023-12-16 NOTE — Progress Notes (Signed)
 Orthopedic Spine Surgery Office Note   Assessment: Patient is a 73 y.o. female with low back pain that radiates into her bilateral lower extremities consistent with neurogenic claudication. She also has PI-LL mismatch with positive sagittal imbalance. Lumbar stenosis is seen at L2/3 and L3/4.      Plan: -Patient has tried PT, ibuprofen , lumbar steroid injection, pain management -We talked about both stimulator and surgical options for her.  Specifically, discussed L2-4 lateral interbody fusion for indirect decompression and posterior instrumented spinal fusion as a surgical option for her.  She wanted to think about it more and will let me know how she would like to proceed -Patient will next be seen at date of surgery   ___________________________________________________________________________     History:   Patient is a 73 y.o. female who presents today for follow-up on her lumbar spine.  At her last visit, we discussed surgery as a treatment option as she had tried multiple conservative treatments without any relief.  She wanted a second opinion and has been following through on that.  She has not noticed any relief with other treatments she has tried since she was last in the office.  She did try a peripheral nerve stimulator that did not work.  She is now talking about doing a spinal cord stimulator trial and possible implantation but is unsure of wanted to go that route.  She still has low back pain that radiates into her bilateral buttocks.  On the right side, it radiates into the anterior thigh and lateral thigh.  On the left, it radiates all the way down the thigh and leg to the level of the ankle.  She notes it mostly on the lateral aspect of the extremity.  She says she has the pain when she is standing or walking and it improves if she sits down or leans forward.   Treatments tried: Physical therapy, ibuprofen , steroid injections, pain management     Physical Exam:   General: no  acute distress, appears stated age Neurologic: alert, answering questions appropriately, following commands Respiratory: unlabored breathing on room air, symmetric chest rise Psychiatric: appropriate affect, normal cadence to speech     MSK (spine):   -Strength exam                                                   Left                  Right EHL                              5/5                  5/5 TA                                 5/5                  5/5 GSC                             5/5                  5/5 Knee extension  5/5                  5/5 Hip flexion                    5/5                  5/5   -Sensory exam                           Sensation intact to light touch in L3-S1 nerve distributions of bilateral lower extremities   Imaging: XRs of the lumbar spine from 12/16/2023 were independently reviewed and interpreted, showing disc height loss at multiple levels in the lumbar spine.  No spondylolisthesis seen.  No fracture or dislocation seen.  There is a coronal curvature with apex to the right at L3/4.   MRI of the lumbar spine from 09/05/2022 was previously independently reviewed and interpreted, showing central stenosis at L1/2.  Central and lateral recess stenosis on the left at L2/3.  Bilateral foraminal stenosis at L2/3.  Central and lateral recess stenosis at L3/4.  Bilateral foraminal stenosis at L3/4.  Central stenosis at L4/5 with anterolisthesis noted.  Right hemilaminotomy at L5/S1.   XR scoli films from 04/20/2023 were previously independently reviewed and interpreted, showing PI of 55, LL of 37 (mismatch of 18). 2.5cm from CSVL. SVA of 7.6cm. T1 pelvis angle of 25.    DEXA scan from 02/21/2021 showed a T score of 0.7     Patient name: [name] [name] Patient MRN: [id number] Date of visit: 12/16/23

## 2024-01-06 ENCOUNTER — Ambulatory Visit: Admitting: Orthopedic Surgery

## 2024-01-06 DIAGNOSIS — M1712 Unilateral primary osteoarthritis, left knee: Secondary | ICD-10-CM

## 2024-01-06 NOTE — Progress Notes (Signed)
 Orthopedic Injection Note   Patient with left knee pain.  Has received previous cortisone injections but they stopped providing her with relief so we talked about HA injections as an additional non-operative treatment..  She was interested in that so we got that set up for that and she cam in today to have this done. This was done today in the office.  See procedure note below.     Left knee intra-articular injection note: After discussing the risk, benefits, alternatives of left knee intra-articular injection, patient elected to proceed.  Patient was in the seated position with the knee at 90 degrees.  The anterior lateral soft spot over the knee was prepped with an alcohol-based prep.  Ethyl chloride was used to anesthetize the skin.  A 20-gauge needle was used to 5cc of hyaluronic acid (HA, Durolane) into the intra-articular space under standard sterile technique.  Needle was withdrawn and Band-Aid was applied.  Patient tolerated the procedure well.     [name] DELENA Ada, MD Orthopedic Surgeon

## 2024-02-07 ENCOUNTER — Ambulatory Visit
Admission: EM | Admit: 2024-02-07 | Discharge: 2024-02-07 | Disposition: A | Discharge status: Home / Self Care | Attending: Family Medicine | Admitting: Family Medicine

## 2024-02-07 ENCOUNTER — Encounter: Payer: Self-pay | Admitting: Emergency Medicine

## 2024-02-07 ENCOUNTER — Encounter: Payer: Self-pay | Admitting: Radiology

## 2024-02-07 DIAGNOSIS — M545 Low back pain, unspecified: Secondary | ICD-10-CM | POA: Diagnosis not present

## 2024-02-07 DIAGNOSIS — M48062 Spinal stenosis, lumbar region with neurogenic claudication: Secondary | ICD-10-CM

## 2024-02-07 MED ORDER — KETOROLAC TROMETHAMINE 30 MG/ML IJ SOLN
15.0000 mg | Freq: Once | INTRAMUSCULAR | Status: AC
  Administered 2024-02-07: 15 mg via INTRAMUSCULAR

## 2024-02-07 MED ORDER — METHYLPREDNISOLONE 4 MG PO TBPK: ORAL_TABLET | ORAL | 0 refills | Status: AC

## 2024-02-07 MED ORDER — HYDROCODONE-ACETAMINOPHEN 7.5-325 MG PO TABS: 1.0000 | ORAL_TABLET | Freq: Every day | ORAL | 0 refills | Status: AC | PRN

## 2024-02-07 NOTE — ED Provider Notes (Signed)
 [name]    CSN: [id number] Arrival date & time: 02/07/24  1446      History   Chief Complaint Chief Complaint  Patient presents with   Back Pain   Hip Pain    HPI [name] [name] is a 73 y.o. female.   HPI  Patient has longstanding lumbar spine problems.  Known spinal stenosis.  Has had at least 2 back surgeries and has been disabled for back pain for over 30 years.  She states that she currently has low back pain that radiates down the sides of her hips to the upper thigh.  She states that is different.  Her pain usually radiates down the back of her legs.  She is worried because she has had bilateral hip replacements.  Concern for hip pain.  No abdominal pain.  No problems with bowels or bladder.  No numbness or weakness in the legs.  Past Medical History:  Diagnosis Date   Allergy    Anemia    Arthritis    Asthma    as child only   Back pain    Diabetes mellitus without complication (HCC)    Hypertension    Hypothyroidism     Patient Active Problem List   Diagnosis Date Noted   Female cystocele 12/12/2022   Facet arthropathy, lumbosacral 06/11/2022   Primary osteoarthritis of left shoulder 01/14/2022   Status post left hip replacement 01/10/2021   Vitamin D deficiency 06/27/2020   Peripheral sensory neuropathy due to type 2 diabetes mellitus (HCC) 12/01/2019   Mild intermittent asthma 12/01/2019   Myositis 12/01/2019   Status post total replacement of right hip 11/18/2018   Spinal stenosis of lumbar region with neurogenic claudication 04/20/2018   Impingement syndrome of right shoulder 04/20/2018   Type II diabetes mellitus, uncontrolled 11/05/2017   Mixed hyperlipidemia 06/04/2017   Acquired hypothyroidism 03/05/2017   Normocytic anemia 01/24/2017   Bilateral carpal tunnel syndrome 05/06/2016   Essential hypertension, benign 02/20/2016   Primary osteoarthritis of left knee 05/24/2014   Elevated blood pressure reading 09/29/2011   Chronic  back pain 09/29/2011   Leg edema 09/29/2011   Obesity with body mass index 30 or greater 09/29/2011    Past Surgical History:  Procedure Laterality Date   ABDOMINAL HYSTERECTOMY  1989   fibroid-no longer needs pap   BACK SURGERY  1987, 1985   DDD   CARPAL TUNNEL RELEASE Left 05/28/2016   Procedure: CARPAL TUNNEL RELEASE;  Surgeon: Arley Curia, MD;  Location: [facility];  Service: Orthopedics;  Laterality: Left;   CARPAL TUNNEL RELEASE Right 03/17/2018   Procedure: CARPAL TUNNEL RELEASE;  Surgeon: Curia Arley, MD;  Location: [facility];  Service: Orthopedics;  Laterality: Right;   TOTAL HIP ARTHROPLASTY Right 11/18/2018   Procedure: RIGHT TOTAL HIP ARTHROPLASTY ANTERIOR APPROACH;  Surgeon: Vernetta Lonni GRADE, MD;  Location: [facility];  Service: Orthopedics;  Laterality: Right;   TOTAL HIP ARTHROPLASTY Left 01/10/2021   Procedure: LEFT TOTAL HIP ARTHROPLASTY ANTERIOR APPROACH;  Surgeon: Vernetta Lonni GRADE, MD;  Location: [facility];  Service: Orthopedics;  Laterality: Left;   TUBAL LIGATION      OB History     Gravida  1   Para  1   Term      Preterm      AB      Living         SAB      IAB      Ectopic  Multiple      Live Births               Home Medications    Prior to Admission medications   Medication Sig Start Date End Date Taking? Authorizing Provider  methylPREDNISolone  (MEDROL  DOSEPAK) 4 MG TBPK tablet tad 02/07/24  Yes [name] Jamee Jacob, MD  albuterol (VENTOLIN HFA) 108 (90 Base) MCG/ACT inhaler Inhale 2 puffs into the lungs every 4 (four) hours as needed.    [provider]  amLODipine  (NORVASC ) 10 MG tablet Take 10 mg by mouth daily. 04/10/20   [provider]  Calcium  Carb-Cholecalciferol (CALTRATE 600+D3) 600-20 MG-MCG TABS Take 1 tablet every day by oral route for 90 days. 07/04/20   [provider]  diazepam  (VALIUM ) 5 MG tablet Take one tablet by mouth with food one hour prior to  procedure. May repeat 30 minutes prior if needed. 05/04/22   Williams, Megan E, NP  diclofenac  (VOLTAREN ) 75 MG EC tablet TAKE ONE TABLET TWICE DAILY 09/21/22   Vernetta Lonni GRADE, MD  diclofenac  Sodium (VOLTAREN ) 1 % GEL APPLY 4 GRAMS TO THE AFFECTED AREA(S) BY TOPICAL ROUTE 4 TIMES PER DAY as needed 01/05/22   [provider]  diphenhydrAMINE  (BENADRYL ) 25 MG tablet Take 0.5 tablets (12.5 mg total) by mouth at bedtime as needed (cramping). 09/30/19   Haviland, Julie, MD  fluticasone (FLONASE) 50 MCG/ACT nasal spray 1 SPRAY TO EACH NOSTRIL EVERY DAY    [provider]  furosemide (LASIX) 20 MG tablet Take 20 mg by mouth daily as needed. 12/16/21   [provider]  glimepiride  (AMARYL ) 1 MG tablet Take 0.5 mg by mouth daily with breakfast.    [provider]  hydrochlorothiazide  (HYDRODIURIL ) 12.5 MG tablet Take 12.5 mg by mouth daily. 12/31/21   [provider]  HYDROcodone -acetaminophen  (NORCO) 7.5-325 MG tablet Take 1 tablet by mouth daily as needed for severe pain (pain score 7-10). 02/07/24   [name] Jamee Jacob, MD  icosapent  Ethyl (VASCEPA ) 1 g capsule Take 2 g by mouth 2 (two) times daily. 05/16/20   [provider]  ketoconazole (NIZORAL) 2 % cream Apply 1 application topically 2 (two) times daily. 12/24/20   [provider]  levothyroxine  (SYNTHROID ) 75 MCG tablet Take 75 mcg by mouth daily before breakfast.    [provider]  losartan (COZAAR) 25 MG tablet     [provider]  Multiple Vitamins-Minerals (CERTAVITE SENIOR/ANTIOXIDANT) TABS Take 1 tablet by mouth daily.    [provider]  potassium chloride (KLOR-CON) 10 MEQ tablet Take 10 mEq by mouth daily as needed. 12/16/21   [provider]  rosuvastatin  (CRESTOR ) 20 MG tablet Take 1 tablet by mouth daily. 11/20/21   [provider]  atorvastatin  (LIPITOR) 20 MG tablet Take 20 mg by mouth daily.  07/15/17 09/30/19  [provider]    Family History Family History  Problem Relation Age of Onset   Asthma Mother    Cancer Mother        AML   Leukemia Mother    Heart disease Father    Hypertension Father    Kidney disease Father    Heart attack Brother    Breast cancer Maternal Aunt    Multiple sclerosis Maternal Aunt     Social History Social History   Tobacco Use   Smoking status: Never   Smokeless tobacco: Never  Vaping Use   Vaping status: Never Used  Substance Use Topics   Alcohol  use: Yes    Comment: social   Drug use: No     Allergies   Gabapentin , Celecoxib , Metformin , and Flagyl [metronidazole]   Review of Systems Review of Systems  See HPI Physical Exam Triage Vital Signs ED Triage Vitals  Encounter Vitals Group     BP 02/07/24 1520 (!) 164/102     Girls Systolic BP Percentile --      Girls Diastolic BP Percentile --      Boys Systolic BP Percentile --      Boys Diastolic BP Percentile --      Pulse Rate 02/07/24 1520 100     Resp 02/07/24 1520 16     Temp 02/07/24 1520 99.1 F (37.3 C)     Temp Source 02/07/24 1520 Oral     SpO2 02/07/24 1520 98 %     Weight --      Height --      Head Circumference --      Peak Flow --      Pain Score 02/07/24 1518 7     Pain Loc --      Pain Education --      Exclude from Growth Chart --    No data found.  Updated Vital Signs BP (!) 169/99   Pulse 100   Temp 99.1 F (37.3 C) (Oral)   Resp 16   SpO2 98%       Physical Exam Constitutional:      General: She is in acute distress.     Appearance: She is well-developed.     Comments: 1.  Guarded movements 2.  Very uncomfortable  HENT:     Head: Normocephalic and atraumatic.  Eyes:     Conjunctiva/sclera: Conjunctivae normal.     Pupils: Pupils are equal, round, and reactive to light.  Cardiovascular:     Rate and Rhythm: Normal rate.  Pulmonary:     Effort: Pulmonary effort is normal. No respiratory distress.  Abdominal:     General: There is no distension.      Palpations: Abdomen is soft.  Musculoskeletal:        General: Tenderness present. Normal range of motion.     Cervical back: Normal range of motion.     Comments: Tenderness centrally at the L4-5 region.  Mild increased muscle tone.  No focal neurologic findings.  Good hip movement  Skin:    General: Skin is warm and dry.  Neurological:     General: No focal deficit present.     Mental Status: She is alert.     Gait: Gait abnormal.     Deep Tendon Reflexes: Reflexes normal.      UC Treatments / Results  Labs (all labs ordered are listed, but only abnormal results are displayed) Labs Reviewed - No data to display  EKG   Radiology No results found.  Procedures Procedures (including critical care time)  Medications Ordered in UC Medications  ketorolac  (TORADOL ) 30 MG/ML injection 15 mg (15 mg Intramuscular Given 02/07/24 1613)  Kidney function is normal Patient expresses an allergy to Celebrex  but is on Voltaren  without difficulty  Initial Impression / Assessment and Plan / UC Course  I have reviewed the triage vital signs and the nursing notes.  Pertinent labs & imaging results that were available during my care of the patient were reviewed by me and considered in my medical decision making (see chart for details).    I do not think she is having increased  hip pathology.  With her spinal stenosis she could be having radiculopathy at  L3 or L4 which would radiate to the hip region. Final Clinical Impressions(s) / UC Diagnoses   Final diagnoses:  Spinal stenosis of lumbar region with neurogenic claudication  Acute bilateral low back pain, unspecified whether sciatica present     Discharge Instructions      Reduce your activity while your back and hips are painful Take the prednisone  pack as directed I have given you a limited number of pain pills to take when pain is severe.  Do not drive on hydrocodone .  These can make you drowsy. Make sure you take your  medicines with food See orthopedic if you fail to improve   ED Prescriptions     Medication Sig Dispense Auth. Provider   methylPREDNISolone  (MEDROL  DOSEPAK) 4 MG TBPK tablet tad 21 tablet [name] Jamee Jacob, MD   HYDROcodone -acetaminophen  (NORCO) 7.5-325 MG tablet Take 1 tablet by mouth daily as needed for severe pain (pain score 7-10). 10 tablet [name] Jamee Jacob, MD      I have reviewed the PDMP during this encounter.   [name] Jamee Jacob, MD 02/07/24 [id number]

## 2024-02-07 NOTE — Discharge Instructions (Signed)
 Reduce your activity while your back and hips are painful Take the prednisone  pack as directed I have given you a limited number of pain pills to take when pain is severe.  Do not drive on hydrocodone .  These can make you drowsy. Make sure you take your medicines with food See orthopedic if you fail to improve

## 2024-02-07 NOTE — ED Triage Notes (Signed)
 Pt presents to UC with c/o lower back pain radiating down into the left leg and bilateral hip pain. States the pain began yesterday morning. Has tried taking Ibuprofen  yesterday with no relief. States has chronic back pain and was dx with spinal stenosis but yesterday the pain was different.

## 2024-03-09 ENCOUNTER — Ambulatory Visit: Admitting: Orthopedic Surgery

## 2024-03-09 DIAGNOSIS — M5416 Radiculopathy, lumbar region: Secondary | ICD-10-CM

## 2024-03-09 NOTE — Progress Notes (Signed)
 Orthopedic Spine Surgery Office Note   Assessment: Patient is a 73 y.o. female with low back pain that radiates into her bilateral lower extremities consistent with neurogenic claudication. She also has PI-LL mismatch with positive sagittal imbalance     Plan: -Patient has tried PT, ibuprofen , lumbar steroid injection, pain management -Patient has had progressive increase in her pain and spite of conservative treatments, her last MRI is over a year old, so recommended a MRI of the lumbar spine to evaluate for stenosis -Patient will return to the office in January to go over the MRI and decide on next steps in treatment, but she is interested in surgical management since she has gotten tired of dealing with this pain   ___________________________________________________________________________     History:   Patient is a 73 y.o. female who presents today for follow-up on her lumbar spine.  Patient continues to have significant low back pain that goes into her bilateral lower extremities.  She feels it going into the lateral aspect of the thigh and leg.  Her left side is more symptomatic than her right.  She notes it more so when she is walking or standing and it gets better if she sits but does not completely go away.  She has now been dealing with this pain for 2 years that has been getting worse with time.  Treatments tried: Physical therapy, ibuprofen , steroid injections, pain management     Physical Exam:   General: no acute distress, appears stated age Neurologic: alert, answering questions appropriately, following commands Respiratory: unlabored breathing on room air, symmetric chest rise Psychiatric: appropriate affect, normal cadence to speech     MSK (spine):   -Strength exam                                                   Left                  Right EHL                              5/5                  5/5 TA                                 5/5                  5/5 GSC                              5/5                  5/5 Knee extension            5/5                  5/5 Hip flexion                    5/5                  5/5   -Sensory exam  Sensation intact to light touch in L3-S1 nerve distributions of bilateral lower extremities   Imaging: XRs of the lumbar spine from 12/16/2023 were previously independently reviewed and interpreted, showing disc height loss at multiple levels in the lumbar spine.  No spondylolisthesis seen.  No fracture or dislocation seen.  There is a coronal curvature with apex to the right at L3/4.   XR scoli films from 04/20/2023 were previously independently reviewed and interpreted, showing PI of 55, LL of 37 (mismatch of 18). 2.5cm from CSVL. SVA of 7.6cm. T1 pelvis angle of 25.    DEXA scan from 02/21/2021 showed a T score of 0.7     Patient name: [name] [name] Patient MRN: [id number] Date of visit: 03/09/24

## 2024-04-04 ENCOUNTER — Ambulatory Visit
Admission: RE | Admit: 2024-04-04 | Discharge: 2024-04-04 | Disposition: A | Discharge status: Home / Self Care | Source: Ambulatory Visit | Attending: Orthopedic Surgery | Admitting: Orthopedic Surgery

## 2024-04-04 DIAGNOSIS — M5416 Radiculopathy, lumbar region: Secondary | ICD-10-CM

## 2024-04-26 ENCOUNTER — Ambulatory Visit: Admitting: Orthopedic Surgery

## 2024-04-26 DIAGNOSIS — M5416 Radiculopathy, lumbar region: Secondary | ICD-10-CM

## 2024-04-26 NOTE — Progress Notes (Signed)
 Orthopedic Spine Surgery Office Note   Assessment: Patient is a 74 y.o. female with low back pain that radiates into her bilateral lower extremities consistent with neurogenic claudication. She also has PI-LL mismatch with positive sagittal imbalance     Plan: -Patient has tried PT, ibuprofen , lumbar steroid injection, pain management -Patient's symptoms have been present for over 2 years and have not gotten better with conservative treatments, so discussed surgery as an option.  Specifically, discussed a L2-4 XLIF, L2-5 posterior instrumented spinal fusion, L4/5 laminectomy.  After discussion, patient elected to proceed  The patient has symptoms consistent with sagittal imbalance and lumbar radiculopathy. The patient's symptoms have not improved with 2 years of conservative treatment so operative management was discussed in the form of L2-4 XLIF, L4/5 laminectomy, L2-L5 PSIF.  I told her that I can use the interbody devices at L2-4 to correct some of her sagittal imbalance and indirectly decompress at those levels.  She does have stenosis at L1/2 but her radicular symptoms are not consistent with that level so will not plan any surgery there.  I cannot adequately decompress the L4/5 level indirectly with the lateral due to an anterior psoas.  Accordingly, we will plan to do a L4/5 laminectomy.  In order to supplement her anterior fixation, we will plan for a L2-5 posterior instrument spinal fusion. The risks including but not limited to dural tear, nerve root injury, paralysis, pseudarthrosis, persistent pain, infection, bleeding, hardware failure, adjacent segment disease, vascular injury, psoas weakness, lumbar plexus injury, heart attack, death, stroke, fracture, dvt/pe, and need for additional procedures were discussed with the patient. Since part of the procedure will involve indirect decompression, there is a risk of persistent symptoms which the patient understood after explaining the reason. The  benefit of the surgery would be improvement in the patient's radiating leg pain and hopefully improvement in her back pain. I told her that back pain is less predictably relieved with surgery. The alternatives to surgical management were covered with the patient and included continued monitoring, physical therapy, over-the-counter pain medications, ambulatory aids, repeat injections, pain management, and activity modification. All the patient's questions were answered to their satisfaction. After this discussion, the patient expressed understanding and elected to proceed with surgical intervention.      ___________________________________________________________________________     History:   Patient is a 74 y.o. female who presents today for follow-up on her lumbar spine.  Patient has had over 2 years of low back pain that goes into her bilateral lower extremities.  She feels along the lateral aspect of the thighs and legs.  Her left side has continued to remain more symptomatic than the right side.  She notes the pain with activity and rest.  Her pain has been getting progressively worse with time.  She has not developed any new symptoms except worsening pain since she was last seen in the office.  No bowel or bladder incontinence.  No saddle anesthesia.   Treatments tried: Physical therapy, ibuprofen , steroid injections, pain management     Physical Exam:   General: no acute distress, appears stated age Neurologic: alert, answering questions appropriately, following commands Respiratory: unlabored breathing on room air, symmetric chest rise Psychiatric: appropriate affect, normal cadence to speech     MSK (spine):   -Strength exam  Left                  Right EHL                              5/5                  5/5 TA                                 5/5                  5/5 GSC                             5/5                  5/5 Knee  extension            5/5                  5/5 Hip flexion                    5/5                  5/5   -Sensory exam                           Sensation intact to light touch in L3-S1 nerve distributions of bilateral lower extremities   Imaging: XRs of the lumbar spine from 12/16/2023 were previously independently reviewed and interpreted, showing disc height loss at multiple levels in the lumbar spine.  No spondylolisthesis seen.  No fracture or dislocation seen.  There is a coronal curvature with apex to the right at L3/4.   XR scoli films from 04/20/2023 were previously independently reviewed and interpreted, showing PI of 55, LL of 37 (mismatch of 18). 2.5cm from CSVL. SVA of 7.6cm. T1 pelvis angle of 25.   MRI of the lumbar spine from 04/04/2024 was independently reviewed and interpreted, showing central stenosis at L1/2. Central and lateral recess stenosis at L2/3. Central, lateral recess, and bilateral foraminal stenosis at L3/4. Bilateral lateral recess stenosis at L4/5.    DEXA scan from 02/21/2021 showed a T score of 0.7     Patient name: [name] [name] Patient MRN: [id number] Date of visit: 04/26/24

## 2024-07-06 ENCOUNTER — Ambulatory Visit: Admitting: Orthopedic Surgery
